# Patient Record
Sex: Female | Born: 1945 | Race: Black or African American | Hispanic: No | Smoking: Current every day smoker
Health system: Southern US, Community
[De-identification: ages and names within clinical notes are randomized; demographics above are authoritative.]

## PROBLEM LIST (undated history)

## (undated) DIAGNOSIS — Z8489 Family history of other specified conditions: Secondary | ICD-10-CM

## (undated) DIAGNOSIS — L97519 Non-pressure chronic ulcer of other part of right foot with unspecified severity: Secondary | ICD-10-CM

## (undated) DIAGNOSIS — L97329 Non-pressure chronic ulcer of left ankle with unspecified severity: Secondary | ICD-10-CM

## (undated) DIAGNOSIS — T4145XA Adverse effect of unspecified anesthetic, initial encounter: Secondary | ICD-10-CM

## (undated) DIAGNOSIS — M1990 Unspecified osteoarthritis, unspecified site: Secondary | ICD-10-CM

## (undated) DIAGNOSIS — I739 Peripheral vascular disease, unspecified: Secondary | ICD-10-CM

## (undated) DIAGNOSIS — T8859XA Other complications of anesthesia, initial encounter: Secondary | ICD-10-CM

## (undated) DIAGNOSIS — I10 Essential (primary) hypertension: Secondary | ICD-10-CM

## (undated) DIAGNOSIS — J449 Chronic obstructive pulmonary disease, unspecified: Secondary | ICD-10-CM

## (undated) DIAGNOSIS — Z972 Presence of dental prosthetic device (complete) (partial): Secondary | ICD-10-CM

## (undated) DIAGNOSIS — R011 Cardiac murmur, unspecified: Secondary | ICD-10-CM

## (undated) DIAGNOSIS — L409 Psoriasis, unspecified: Secondary | ICD-10-CM

## (undated) DIAGNOSIS — K219 Gastro-esophageal reflux disease without esophagitis: Secondary | ICD-10-CM

## (undated) DIAGNOSIS — K5900 Constipation, unspecified: Secondary | ICD-10-CM

## (undated) HISTORY — PX: DILATION AND CURETTAGE OF UTERUS: SHX78

## (undated) HISTORY — DX: Gastro-esophageal reflux disease without esophagitis: K21.9

## (undated) HISTORY — PX: CARDIOVASCULAR STRESS TEST: SHX262

## (undated) HISTORY — PX: ENDOVASCULAR STENT INSERTION: SHX5161

## (undated) HISTORY — PX: TUBAL LIGATION: SHX77

---

## 2005-04-02 ENCOUNTER — Emergency Department: Payer: Self-pay | Admitting: Unknown Physician Specialty

## 2005-04-02 ENCOUNTER — Other Ambulatory Visit: Payer: Self-pay

## 2005-07-06 ENCOUNTER — Other Ambulatory Visit: Payer: Self-pay

## 2005-07-06 ENCOUNTER — Inpatient Hospital Stay: Payer: Self-pay | Admitting: Internal Medicine

## 2007-03-06 ENCOUNTER — Ambulatory Visit: Payer: Self-pay

## 2009-07-27 ENCOUNTER — Ambulatory Visit: Payer: Self-pay

## 2011-01-02 ENCOUNTER — Emergency Department: Payer: Self-pay | Admitting: *Deleted

## 2011-01-06 ENCOUNTER — Ambulatory Visit: Payer: Self-pay | Admitting: Family Medicine

## 2013-05-29 HISTORY — PX: CATARACT EXTRACTION W/ INTRAOCULAR LENS  IMPLANT, BILATERAL: SHX1307

## 2013-06-03 ENCOUNTER — Ambulatory Visit: Payer: Self-pay | Admitting: Family Medicine

## 2013-06-10 ENCOUNTER — Ambulatory Visit: Payer: Self-pay | Admitting: Family Medicine

## 2013-10-01 ENCOUNTER — Ambulatory Visit: Payer: Self-pay | Admitting: Ophthalmology

## 2013-10-17 ENCOUNTER — Ambulatory Visit: Payer: Self-pay | Admitting: Family Medicine

## 2013-10-29 ENCOUNTER — Ambulatory Visit: Payer: Self-pay | Admitting: Ophthalmology

## 2013-12-03 ENCOUNTER — Ambulatory Visit (INDEPENDENT_AMBULATORY_CARE_PROVIDER_SITE_OTHER): Payer: Commercial Managed Care - HMO | Admitting: Podiatry

## 2013-12-03 ENCOUNTER — Encounter: Payer: Self-pay | Admitting: Podiatry

## 2013-12-03 VITALS — BP 139/70 | HR 85 | Resp 16 | Ht 64.0 in | Wt 116.0 lb

## 2013-12-03 DIAGNOSIS — M79609 Pain in unspecified limb: Secondary | ICD-10-CM

## 2013-12-03 DIAGNOSIS — L97322 Non-pressure chronic ulcer of left ankle with fat layer exposed: Secondary | ICD-10-CM

## 2013-12-03 DIAGNOSIS — B353 Tinea pedis: Secondary | ICD-10-CM

## 2013-12-03 DIAGNOSIS — M79676 Pain in unspecified toe(s): Secondary | ICD-10-CM

## 2013-12-03 DIAGNOSIS — L97309 Non-pressure chronic ulcer of unspecified ankle with unspecified severity: Secondary | ICD-10-CM

## 2013-12-03 DIAGNOSIS — B351 Tinea unguium: Secondary | ICD-10-CM

## 2013-12-03 MED ORDER — ECONAZOLE NITRATE 1 % EX CREA: TOPICAL_CREAM | Freq: Two times a day (BID) | CUTANEOUS | Status: DC

## 2013-12-03 NOTE — Progress Notes (Signed)
   Subjective:    Patient ID: Tracey Rogers, female    DOB: Jan 22, 1946, 68 y.o.   MRN: [id number]  HPI Comments: SHE NEEDS HER TOENAILS TRIMMED. SHE WENT TO WOUND CARE YESTERDAY AND THEY TOLD HER SHE NEEDED THEM CUT. THEY ARE STARTING TO RUB.     Review of Systems     Objective:   Physical Exam: I have reviewed her past medical history medications allergies surgeries social history. Pulses are palpable bilateral. Neurologic sensorium is intact per since once the monofilament. Deep tendon reflexes are intact bilateral muscle strength is 5 over 5 dorsiflexors plantar flexors inverters everters all his musculature is intact. Orthopedic evaluation demonstrates all joints distal to the ankle a full range of motion without crepitus she has HAV deformity hammertoe deformities noted bilateral. Cutaneous evaluation demonstrates a ulcerative lesion the posterior aspect of the left leg which the wound care center is taking care of. She also has developed a superficial ulceration to the medial aspect distal second toe left foot. No bone is visible and is very superficial without signs of infection. Nails are thick yellow dystrophic onychomycotic and painful palpation.        Assessment & Plan:  Assessment: Ulceration posterior aspect left foot. Ulceration second digit left foot. Pain in limb secondary to onychomycosis 1 through 5 bilateral.  Plan: Debridement of nails 1 through 5 bilateral. Debrided the ulcer to the second toe left today applied dry sterile compressive dressing with instructions of care. I will followup with her in 3 months wound care center well evaluate all ulcerations.

## 2013-12-24 ENCOUNTER — Ambulatory Visit: Payer: Self-pay | Admitting: Vascular Surgery

## 2013-12-24 LAB — BASIC METABOLIC PANEL
Anion Gap: 12 (ref 7–16)
BUN: 5 mg/dL — AB (ref 7–18)
CALCIUM: 9 mg/dL (ref 8.5–10.1)
Chloride: 92 mmol/L — ABNORMAL LOW (ref 98–107)
Co2: 24 mmol/L (ref 21–32)
Creatinine: 0.53 mg/dL — ABNORMAL LOW (ref 0.60–1.30)
EGFR (African American): >60
EGFR (Non-African Amer.): >60
Glucose: 86 mg/dL (ref 65–99)
OSMOLALITY: 254 (ref 275–301)
POTASSIUM: 3.5 mmol/L (ref 3.5–5.1)
SODIUM: 128 mmol/L — AB (ref 136–145)

## 2014-01-06 ENCOUNTER — Other Ambulatory Visit: Payer: Self-pay

## 2014-01-06 ENCOUNTER — Telehealth: Payer: Self-pay | Admitting: *Deleted

## 2014-01-06 LAB — HEMOGLOBIN A1C: Hemoglobin A1C: 5.3 % (ref 4.2–6.3)

## 2014-01-06 NOTE — Telephone Encounter (Signed)
Can he call her in something else for the bottom of her foot?  The cream he ordered isn't helping.  You can reach me at this number.

## 2014-01-06 NOTE — Telephone Encounter (Signed)
You could call her in naftin cream 1% apply twice daily.  60 gram tube.  3 refills.

## 2014-01-07 ENCOUNTER — Telehealth: Payer: Self-pay | Admitting: *Deleted

## 2014-01-07 MED ORDER — NAFTIFINE HCL 2 % EX CREA: 1.0000 "application " | TOPICAL_CREAM | CUTANEOUS | Status: DC

## 2014-01-07 NOTE — Telephone Encounter (Signed)
Pt needing new cream for her foot , ok per dr Milinda Pointer to call in naftin cream

## 2014-01-09 ENCOUNTER — Encounter: Payer: Self-pay | Admitting: Surgery

## 2014-01-11 LAB — WOUND CULTURE

## 2014-01-27 ENCOUNTER — Encounter: Payer: Self-pay | Admitting: Surgery

## 2014-02-03 ENCOUNTER — Encounter: Payer: Self-pay | Admitting: General Surgery

## 2014-02-06 ENCOUNTER — Ambulatory Visit: Payer: Self-pay | Admitting: Vascular Surgery

## 2014-02-06 LAB — BASIC METABOLIC PANEL
Anion Gap: 9 (ref 7–16)
BUN: 6 mg/dL — AB (ref 7–18)
CALCIUM: 8.8 mg/dL (ref 8.5–10.1)
CHLORIDE: 98 mmol/L (ref 98–107)
Co2: 22 mmol/L (ref 21–32)
Creatinine: 0.79 mg/dL (ref 0.60–1.30)
EGFR (Non-African Amer.): >60
Glucose: 80 mg/dL (ref 65–99)
Osmolality: 256 (ref 275–301)
POTASSIUM: 4.2 mmol/L (ref 3.5–5.1)
Sodium: 129 mmol/L — ABNORMAL LOW (ref 136–145)

## 2014-02-10 DIAGNOSIS — L97929 Non-pressure chronic ulcer of unspecified part of left lower leg with unspecified severity: Secondary | ICD-10-CM | POA: Insufficient documentation

## 2014-02-10 DIAGNOSIS — L97909 Non-pressure chronic ulcer of unspecified part of unspecified lower leg with unspecified severity: Secondary | ICD-10-CM | POA: Insufficient documentation

## 2014-02-16 DIAGNOSIS — I771 Stricture of artery: Secondary | ICD-10-CM | POA: Insufficient documentation

## 2014-02-16 DIAGNOSIS — Z720 Tobacco use: Secondary | ICD-10-CM | POA: Insufficient documentation

## 2014-02-16 DIAGNOSIS — L97309 Non-pressure chronic ulcer of unspecified ankle with unspecified severity: Secondary | ICD-10-CM | POA: Insufficient documentation

## 2014-02-16 DIAGNOSIS — I7025 Atherosclerosis of native arteries of other extremities with ulceration: Secondary | ICD-10-CM | POA: Insufficient documentation

## 2014-02-16 DIAGNOSIS — G458 Other transient cerebral ischemic attacks and related syndromes: Secondary | ICD-10-CM | POA: Insufficient documentation

## 2014-02-16 DIAGNOSIS — I739 Peripheral vascular disease, unspecified: Secondary | ICD-10-CM | POA: Insufficient documentation

## 2014-02-16 DIAGNOSIS — I10 Essential (primary) hypertension: Secondary | ICD-10-CM | POA: Insufficient documentation

## 2014-02-16 DIAGNOSIS — L98499 Non-pressure chronic ulcer of skin of other sites with unspecified severity: Secondary | ICD-10-CM | POA: Insufficient documentation

## 2014-02-16 DIAGNOSIS — J449 Chronic obstructive pulmonary disease, unspecified: Secondary | ICD-10-CM | POA: Insufficient documentation

## 2014-02-16 DIAGNOSIS — Z87891 Personal history of nicotine dependence: Secondary | ICD-10-CM | POA: Insufficient documentation

## 2014-03-03 HISTORY — PX: TRANSTHORACIC ECHOCARDIOGRAM: SHX275

## 2014-03-09 ENCOUNTER — Ambulatory Visit (INDEPENDENT_AMBULATORY_CARE_PROVIDER_SITE_OTHER): Payer: Commercial Managed Care - HMO | Admitting: Podiatry

## 2014-03-09 DIAGNOSIS — M79676 Pain in unspecified toe(s): Secondary | ICD-10-CM

## 2014-03-09 DIAGNOSIS — B351 Tinea unguium: Secondary | ICD-10-CM

## 2014-03-09 NOTE — Progress Notes (Signed)
Presents today chief complaint of painful elongated toenails.  Objective: Pulses are palpable bilateral nails are thick, yellow dystrophic onychomycosis and painful palpation.   Assessment: Onychomycosis with pain in limb.  Plan: Treatment of nails in thickness and length as covered service secondary to pain.  

## 2014-03-18 ENCOUNTER — Ambulatory Visit: Payer: Self-pay | Admitting: Vascular Surgery

## 2014-03-18 LAB — URINALYSIS, COMPLETE
BLOOD: NEGATIVE
Bacteria: NONE SEEN
Bilirubin,UR: NEGATIVE
Glucose,UR: NEGATIVE mg/dL (ref 0–75)
Ketone: NEGATIVE
Nitrite: NEGATIVE
PROTEIN: NEGATIVE
Ph: 6 (ref 4.5–8.0)
RBC, UR: NONE SEEN /HPF (ref 0–5)
Specific Gravity: 1.008 (ref 1.003–1.030)
Squamous Epithelial: <1
WBC UR: 1 /HPF (ref 0–5)

## 2014-03-18 LAB — CBC
HCT: 34.3 % — ABNORMAL LOW (ref 35.0–47.0)
HGB: 10.7 g/dL — ABNORMAL LOW (ref 12.0–16.0)
MCH: 29.8 pg (ref 26.0–34.0)
MCHC: 31.3 g/dL — ABNORMAL LOW (ref 32.0–36.0)
MCV: 95 fL (ref 80–100)
PLATELETS: 342 10*3/uL (ref 150–440)
RBC: 3.6 10*6/uL — AB (ref 3.80–5.20)
RDW: 14.8 % — AB (ref 11.5–14.5)
WBC: 5 10*3/uL (ref 3.6–11.0)

## 2014-03-18 LAB — MRSA PCR SCREENING

## 2014-03-18 LAB — BASIC METABOLIC PANEL
Anion Gap: 8 (ref 7–16)
BUN: 9 mg/dL (ref 7–18)
CALCIUM: 8.8 mg/dL (ref 8.5–10.1)
CREATININE: 0.66 mg/dL (ref 0.60–1.30)
Chloride: 99 mmol/L (ref 98–107)
Co2: 25 mmol/L (ref 21–32)
GLUCOSE: 75 mg/dL (ref 65–99)
OSMOLALITY: 262 (ref 275–301)
Potassium: 4.6 mmol/L (ref 3.5–5.1)
SODIUM: 132 mmol/L — AB (ref 136–145)

## 2014-03-18 LAB — PROTIME-INR
INR: 0.9
Prothrombin Time: 12.5 secs (ref 11.5–14.7)

## 2014-03-18 LAB — APTT: Activated PTT: 32.8 secs (ref 23.6–35.9)

## 2014-03-27 ENCOUNTER — Inpatient Hospital Stay: Payer: Self-pay | Admitting: Vascular Surgery

## 2014-03-28 LAB — BASIC METABOLIC PANEL
ANION GAP: 8 (ref 7–16)
BUN: 5 mg/dL — AB (ref 7–18)
CALCIUM: 8 mg/dL — AB (ref 8.5–10.1)
CHLORIDE: 103 mmol/L (ref 98–107)
CREATININE: 0.56 mg/dL — AB (ref 0.60–1.30)
Co2: 25 mmol/L (ref 21–32)
Glucose: 94 mg/dL (ref 65–99)
Osmolality: 269 (ref 275–301)
POTASSIUM: 3.7 mmol/L (ref 3.5–5.1)
Sodium: 136 mmol/L (ref 136–145)

## 2014-03-28 LAB — CBC WITH DIFFERENTIAL/PLATELET
Basophil #: 0.1 10*3/uL (ref 0.0–0.1)
Basophil %: 1.1 %
EOS ABS: 0.2 10*3/uL (ref 0.0–0.7)
EOS PCT: 3.4 %
HCT: 29.2 % — AB (ref 35.0–47.0)
HGB: 9.6 g/dL — ABNORMAL LOW (ref 12.0–16.0)
LYMPHS PCT: 13.4 %
Lymphocyte #: 0.8 10*3/uL — ABNORMAL LOW (ref 1.0–3.6)
MCH: 30.8 pg (ref 26.0–34.0)
MCHC: 32.8 g/dL (ref 32.0–36.0)
MCV: 94 fL (ref 80–100)
MONO ABS: 0.7 x10 3/mm (ref 0.2–0.9)
MONOS PCT: 11.7 %
NEUTROS ABS: 4.3 10*3/uL (ref 1.4–6.5)
Neutrophil %: 70.4 %
Platelet: 222 10*3/uL (ref 150–440)
RBC: 3.1 10*6/uL — AB (ref 3.80–5.20)
RDW: 14.4 % (ref 11.5–14.5)
WBC: 6.1 10*3/uL (ref 3.6–11.0)

## 2014-04-08 ENCOUNTER — Encounter (HOSPITAL_BASED_OUTPATIENT_CLINIC_OR_DEPARTMENT_OTHER): Payer: Self-pay | Admitting: *Deleted

## 2014-04-09 ENCOUNTER — Encounter (HOSPITAL_BASED_OUTPATIENT_CLINIC_OR_DEPARTMENT_OTHER): Payer: Self-pay | Admitting: *Deleted

## 2014-04-09 NOTE — Progress Notes (Signed)
NPO AFTER MN WITH EXCEPTION CLEAR LIQUIDS UNTIL 0700 (NO CREAM/ MILK PRODUCTS).  ARRIVE AT 1130. NEEDS ISTAT . CURRENT EKG , STRESS TEST AND LOV NOTE TO BE FAXED FROM DR PARACHOAS.  MAY TAKE OXYCODONE IF NEEDED AM DOS W/ SIPS OF WATER.

## 2014-04-10 ENCOUNTER — Encounter (HOSPITAL_BASED_OUTPATIENT_CLINIC_OR_DEPARTMENT_OTHER): Payer: Self-pay | Admitting: *Deleted

## 2014-04-10 ENCOUNTER — Other Ambulatory Visit (HOSPITAL_COMMUNITY): Payer: Self-pay | Admitting: Plastic Surgery

## 2014-04-10 ENCOUNTER — Other Ambulatory Visit: Payer: Self-pay | Admitting: Plastic Surgery

## 2014-04-10 ENCOUNTER — Ambulatory Visit (HOSPITAL_COMMUNITY)
Admission: RE | Admit: 2014-04-10 | Discharge: 2014-04-10 | Disposition: A | Discharge status: Home / Self Care | Payer: Medicare HMO | Source: Ambulatory Visit | Attending: Plastic Surgery | Admitting: Plastic Surgery

## 2014-04-10 DIAGNOSIS — M85871 Other specified disorders of bone density and structure, right ankle and foot: Secondary | ICD-10-CM | POA: Diagnosis not present

## 2014-04-10 DIAGNOSIS — B999 Unspecified infectious disease: Secondary | ICD-10-CM

## 2014-04-10 DIAGNOSIS — M2011 Hallux valgus (acquired), right foot: Secondary | ICD-10-CM | POA: Insufficient documentation

## 2014-04-10 DIAGNOSIS — S91301A Unspecified open wound, right foot, initial encounter: Secondary | ICD-10-CM | POA: Diagnosis present

## 2014-04-10 DIAGNOSIS — L97509 Non-pressure chronic ulcer of other part of unspecified foot with unspecified severity: Secondary | ICD-10-CM | POA: Insufficient documentation

## 2014-04-10 DIAGNOSIS — M7989 Other specified soft tissue disorders: Secondary | ICD-10-CM | POA: Diagnosis not present

## 2014-04-10 DIAGNOSIS — L97913 Non-pressure chronic ulcer of unspecified part of right lower leg with necrosis of muscle: Secondary | ICD-10-CM

## 2014-04-10 DIAGNOSIS — E119 Type 2 diabetes mellitus without complications: Secondary | ICD-10-CM | POA: Insufficient documentation

## 2014-04-10 DIAGNOSIS — L97519 Non-pressure chronic ulcer of other part of right foot with unspecified severity: Secondary | ICD-10-CM

## 2014-04-10 NOTE — H&P (Signed)
Tracey Rogers is an 68 y.o. female.   Chief Complaint: bilateral lower extremity ulcers HPI: The patient is a 68 yrs old wf here for evaluation of her left leg ulcer. She hit her leg 4 months ago and developed a wound on the posterior left ankle. She has been seen in the wound care center and been treated with local dressing changes. The achilles tendon is exposed and masserated. The wound is 7 x 3 cm in length. The ankle has little to no movement in plantar or dorsiflexion. The pulses are weak but present. She quit smoking 2 months ago. She recently underwent left leg stent placement by a vascular surgeon, Dr. Delana Meyer, in [place] [phone]). The area seems to be getting worse and larger in size. She has started drinking Ensure. She now has a wound on the medial aspect of the right great toe.  Past Medical History  Diagnosis Date  . Hypertension   . Ulcer of left ankle   . Peripheral vascular disease   . Constipation   . Arthritis   . Psoriasis   . Skin ulcer of right great toe   . Wears dentures     Past Surgical History  Procedure Laterality Date  . Cataract extraction w/ intraocular lens  implant, bilateral  2015  . Endovascular stent insertion  sept  &  oct  2015    LEFT LEG STENTING    No family history on file. Social History:  reports that she quit smoking about 3 months ago. Her smoking use included Cigarettes. She has a 21 pack-year smoking history. She has never used smokeless tobacco. She reports that she does not drink alcohol or use illicit drugs.  Allergies:  Allergies  Allergen Reactions  . Tetracyclines & Related Rash     (Not in a hospital admission)  No results found for this or any previous visit (from the past 48 hour(s)). No results found.  Review of Systems  Constitutional: Negative.   HENT: Negative.   Eyes: Negative.   Respiratory: Negative.   Cardiovascular: Negative.   Gastrointestinal: Negative.   Genitourinary: Negative.     Musculoskeletal: Positive for joint pain.  Skin: Negative.   Psychiatric/Behavioral: Negative.     There were no vitals taken for this visit. Physical Exam  Constitutional: She is oriented to person, place, and time. She appears well-developed.  HENT:  Head: Normocephalic and atraumatic.  Eyes: Conjunctivae and EOM are normal. Pupils are equal, round, and reactive to light.  Cardiovascular: Normal rate.   Respiratory: Effort normal.  Musculoskeletal:       Legs: Neurological: She is alert and oriented to person, place, and time.  Psychiatric: She has a normal mood and affect. Her behavior is normal. Judgment and thought content normal.     Assessment/Plan Plan for irrigation and debridement of bilateral lower extremity wounds. Will also get an xray of the right foot. The risks that can be encountered with and after excision of a wound were discussed and include the following but not limited to these: bleeding, infection, delayed healing, anesthesia risks, skin sensation changes, injury to structures including nerves, blood vessels, and muscles which may be temporary or permanent, allergies to tape, suture materials and glues, blood products, topical preparations or injected agents, skin contour irregularities, skin discoloration and swelling, deep vein thrombosis, cardiac and pulmonary complications, pain, which may persist, persistent pain, recurrence of the lesion, poor healing of the incision, possible need for revisional surgery or staged procedures.  SANGER,CLAIRE 04/10/2014,  12:48 PM

## 2014-04-12 NOTE — Anesthesia Preprocedure Evaluation (Addendum)
Anesthesia Evaluation  Patient identified by MRN, date of birth, ID band Patient awake    Reviewed: Allergy & Precautions, H&P , NPO status , Patient's Chart, lab work & pertinent test results  History of Anesthesia Complications Negative for: history of anesthetic complications  Airway Mallampati: II  TM Distance: >3 FB Neck ROM: Full    Dental no notable dental hx. (+) Edentulous Upper, Edentulous Lower   Pulmonary former smoker,  breath sounds clear to auscultation  Pulmonary exam normal       Cardiovascular hypertension, Pt. on medications + Peripheral Vascular Disease Rhythm:Regular Rate:Normal  Seen in October 2015 by cardiology and cleared for surgery, normal ECHO and negative stress testing at that time    Neuro/Psych negative neurological ROS  negative psych ROS   GI/Hepatic negative GI ROS, Neg liver ROS,   Endo/Other  negative endocrine ROS  Renal/GU negative Renal ROS  negative genitourinary   Musculoskeletal  (+) Arthritis -, Osteoarthritis,    Abdominal   Peds negative pediatric ROS (+)  Hematology negative hematology ROS (+)   Anesthesia Other Findings   Reproductive/Obstetrics negative OB ROS                            Anesthesia Physical Anesthesia Plan  ASA: III  Anesthesia Plan: General   Post-op Pain Management:    Induction: Intravenous  Airway Management Planned: LMA  Additional Equipment:   Intra-op Plan:   Post-operative Plan: Extubation in OR  Informed Consent: I have reviewed the patients History and Physical, chart, labs and discussed the procedure including the risks, benefits and alternatives for the proposed anesthesia with the patient or authorized representative who has indicated his/her understanding and acceptance.   Dental advisory given  Plan Discussed with: CRNA  Anesthesia Plan Comments:        Anesthesia Quick Evaluation

## 2014-04-13 ENCOUNTER — Encounter (HOSPITAL_BASED_OUTPATIENT_CLINIC_OR_DEPARTMENT_OTHER)
Admission: RE | Disposition: A | Discharge status: Home / Self Care | Payer: Self-pay | Source: Ambulatory Visit | Attending: Plastic Surgery

## 2014-04-13 ENCOUNTER — Ambulatory Visit (HOSPITAL_BASED_OUTPATIENT_CLINIC_OR_DEPARTMENT_OTHER)
Admission: RE | Admit: 2014-04-13 | Discharge: 2014-04-13 | Disposition: A | Discharge status: Home / Self Care | Payer: Commercial Managed Care - HMO | Source: Ambulatory Visit | Attending: Plastic Surgery | Admitting: Plastic Surgery

## 2014-04-13 ENCOUNTER — Ambulatory Visit (HOSPITAL_BASED_OUTPATIENT_CLINIC_OR_DEPARTMENT_OTHER): Payer: Commercial Managed Care - HMO | Admitting: Anesthesiology

## 2014-04-13 ENCOUNTER — Encounter (HOSPITAL_BASED_OUTPATIENT_CLINIC_OR_DEPARTMENT_OTHER): Payer: Self-pay | Admitting: Plastic Surgery

## 2014-04-13 DIAGNOSIS — L409 Psoriasis, unspecified: Secondary | ICD-10-CM | POA: Insufficient documentation

## 2014-04-13 DIAGNOSIS — L97511 Non-pressure chronic ulcer of other part of right foot limited to breakdown of skin: Secondary | ICD-10-CM | POA: Insufficient documentation

## 2014-04-13 DIAGNOSIS — Z7982 Long term (current) use of aspirin: Secondary | ICD-10-CM | POA: Diagnosis not present

## 2014-04-13 DIAGNOSIS — L97519 Non-pressure chronic ulcer of other part of right foot with unspecified severity: Secondary | ICD-10-CM

## 2014-04-13 DIAGNOSIS — Z87891 Personal history of nicotine dependence: Secondary | ICD-10-CM | POA: Diagnosis not present

## 2014-04-13 DIAGNOSIS — M1990 Unspecified osteoarthritis, unspecified site: Secondary | ICD-10-CM | POA: Insufficient documentation

## 2014-04-13 DIAGNOSIS — L97322 Non-pressure chronic ulcer of left ankle with fat layer exposed: Secondary | ICD-10-CM | POA: Diagnosis present

## 2014-04-13 DIAGNOSIS — I739 Peripheral vascular disease, unspecified: Secondary | ICD-10-CM | POA: Insufficient documentation

## 2014-04-13 DIAGNOSIS — I10 Essential (primary) hypertension: Secondary | ICD-10-CM | POA: Diagnosis not present

## 2014-04-13 DIAGNOSIS — L97913 Non-pressure chronic ulcer of unspecified part of right lower leg with necrosis of muscle: Secondary | ICD-10-CM

## 2014-04-13 DIAGNOSIS — Z881 Allergy status to other antibiotic agents status: Secondary | ICD-10-CM | POA: Insufficient documentation

## 2014-04-13 HISTORY — DX: Psoriasis, unspecified: L40.9

## 2014-04-13 HISTORY — DX: Non-pressure chronic ulcer of left ankle with unspecified severity: L97.329

## 2014-04-13 HISTORY — PX: INCISION AND DRAINAGE OF WOUND: SHX1803

## 2014-04-13 HISTORY — DX: Non-pressure chronic ulcer of other part of right foot with unspecified severity: L97.519

## 2014-04-13 HISTORY — DX: Constipation, unspecified: K59.00

## 2014-04-13 HISTORY — DX: Peripheral vascular disease, unspecified: I73.9

## 2014-04-13 HISTORY — DX: Presence of dental prosthetic device (complete) (partial): Z97.2

## 2014-04-13 HISTORY — DX: Essential (primary) hypertension: I10

## 2014-04-13 HISTORY — PX: APPLICATION OF A-CELL OF EXTREMITY: SHX6303

## 2014-04-13 HISTORY — DX: Unspecified osteoarthritis, unspecified site: M19.90

## 2014-04-13 LAB — POCT I-STAT 4, (NA,K, GLUC, HGB,HCT)
Glucose, Bld: 98 mg/dL (ref 70–99)
HCT: 36 % (ref 36.0–46.0)
HEMOGLOBIN: 12.2 g/dL (ref 12.0–15.0)
Potassium: 4.4 mEq/L (ref 3.7–5.3)
Sodium: 134 mEq/L — ABNORMAL LOW (ref 137–147)

## 2014-04-13 SURGERY — IRRIGATION AND DEBRIDEMENT WOUND
Anesthesia: General | Site: Foot | Laterality: Left

## 2014-04-13 MED ORDER — MIDAZOLAM HCL 2 MG/2ML IJ SOLN
INTRAMUSCULAR | Status: AC
  Filled 2014-04-13: qty 2

## 2014-04-13 MED ORDER — DEXAMETHASONE SODIUM PHOSPHATE 4 MG/ML IJ SOLN
INTRAMUSCULAR | Status: DC | PRN
  Administered 2014-04-13: 4 mg via INTRAVENOUS

## 2014-04-13 MED ORDER — FENTANYL CITRATE 0.05 MG/ML IJ SOLN
INTRAMUSCULAR | Status: DC | PRN
  Administered 2014-04-13 (×2): 25 ug via INTRAVENOUS
  Administered 2014-04-13: 50 ug via INTRAVENOUS
  Administered 2014-04-13 (×4): 25 ug via INTRAVENOUS

## 2014-04-13 MED ORDER — FENTANYL CITRATE 0.05 MG/ML IJ SOLN
25.0000 ug | INTRAMUSCULAR | Status: DC | PRN
  Filled 2014-04-13: qty 1

## 2014-04-13 MED ORDER — HYDROCODONE-ACETAMINOPHEN 5-325 MG PO TABS: 1.0000 | ORAL_TABLET | Freq: Four times a day (QID) | ORAL | Status: DC | PRN

## 2014-04-13 MED ORDER — SODIUM CHLORIDE 0.9 % IR SOLN
Status: DC | PRN
  Administered 2014-04-13: 14:00:00

## 2014-04-13 MED ORDER — LIDOCAINE HCL (CARDIAC) 20 MG/ML IV SOLN
INTRAVENOUS | Status: DC | PRN
  Administered 2014-04-13: 50 mg via INTRAVENOUS

## 2014-04-13 MED ORDER — OXYCODONE HCL 5 MG PO TABS
5.0000 mg | ORAL_TABLET | ORAL | Status: DC | PRN
  Administered 2014-04-13: 5 mg via ORAL
  Filled 2014-04-13: qty 1

## 2014-04-13 MED ORDER — LACTATED RINGERS IV SOLN
INTRAVENOUS | Status: DC
  Administered 2014-04-13: 13:00:00 via INTRAVENOUS
  Filled 2014-04-13: qty 1000

## 2014-04-13 MED ORDER — PROPOFOL 10 MG/ML IV BOLUS
INTRAVENOUS | Status: DC | PRN
  Administered 2014-04-13: 30 mg via INTRAVENOUS

## 2014-04-13 MED ORDER — CEFAZOLIN SODIUM-DEXTROSE 2-3 GM-% IV SOLR
2.0000 g | INTRAVENOUS | Status: AC
  Administered 2014-04-13: 2 g via INTRAVENOUS
  Filled 2014-04-13: qty 50

## 2014-04-13 MED ORDER — ACETAMINOPHEN 10 MG/ML IV SOLN
INTRAVENOUS | Status: DC | PRN
  Administered 2014-04-13: 1000 mg via INTRAVENOUS

## 2014-04-13 MED ORDER — OXYCODONE HCL 5 MG PO TABS: 5.0000 mg | ORAL_TABLET | ORAL | Status: DC | PRN

## 2014-04-13 MED ORDER — OXYCODONE HCL 5 MG PO TABS
ORAL_TABLET | ORAL | Status: AC
  Filled 2014-04-13: qty 1

## 2014-04-13 MED ORDER — ONDANSETRON HCL 4 MG/2ML IJ SOLN
INTRAMUSCULAR | Status: DC | PRN
  Administered 2014-04-13: 4 mg via INTRAVENOUS

## 2014-04-13 MED ORDER — ONDANSETRON HCL 4 MG/2ML IJ SOLN
4.0000 mg | Freq: Once | INTRAMUSCULAR | Status: DC | PRN
  Filled 2014-04-13: qty 2

## 2014-04-13 MED ORDER — LIDOCAINE-EPINEPHRINE (PF) 1 %-1:200000 IJ SOLN
INTRAMUSCULAR | Status: DC | PRN
  Administered 2014-04-13: 2 mL

## 2014-04-13 MED ORDER — FENTANYL CITRATE 0.05 MG/ML IJ SOLN
INTRAMUSCULAR | Status: AC
  Filled 2014-04-13: qty 4

## 2014-04-13 MED ORDER — CEFAZOLIN SODIUM-DEXTROSE 2-3 GM-% IV SOLR
INTRAVENOUS | Status: AC
  Filled 2014-04-13: qty 50

## 2014-04-13 SURGICAL SUPPLY — 97 items
BAG DECANTER FOR FLEXI CONT (MISCELLANEOUS) IMPLANT
BANDAGE ELASTIC 3 VELCRO ST LF (GAUZE/BANDAGES/DRESSINGS) IMPLANT
BANDAGE ELASTIC 4 VELCRO ST LF (GAUZE/BANDAGES/DRESSINGS) ×6 IMPLANT
BANDAGE ELASTIC 6 VELCRO ST LF (GAUZE/BANDAGES/DRESSINGS) IMPLANT
BENZOIN TINCTURE PRP APPL 2/3 (GAUZE/BANDAGES/DRESSINGS) IMPLANT
BLADE MINI RND TIP GREEN BEAV (BLADE) IMPLANT
BLADE SURG 10 STRL SS (BLADE) ×6 IMPLANT
BLADE SURG 15 STRL LF DISP TIS (BLADE) ×2 IMPLANT
BLADE SURG 15 STRL SS (BLADE) ×1
BNDG COHESIVE 1X5 TAN STRL LF (GAUZE/BANDAGES/DRESSINGS) IMPLANT
BNDG COHESIVE 4X5 TAN NS LF (GAUZE/BANDAGES/DRESSINGS) IMPLANT
BNDG ESMARK 4X9 LF (GAUZE/BANDAGES/DRESSINGS) IMPLANT
BNDG GAUZE ELAST 4 BULKY (GAUZE/BANDAGES/DRESSINGS) ×6 IMPLANT
CANISTER OMNI JUG 16 LITER (MISCELLANEOUS) IMPLANT
CANISTER SUCT LVC 12 LTR MEDI- (MISCELLANEOUS) IMPLANT
CANISTER SUCTION 1200CC (MISCELLANEOUS) IMPLANT
CANISTER SUCTION 2500CC (MISCELLANEOUS) ×3 IMPLANT
CHLORAPREP W/TINT 26ML (MISCELLANEOUS) IMPLANT
CLOTH BEACON ORANGE TIMEOUT ST (SAFETY) ×3 IMPLANT
CORDS BIPOLAR (ELECTRODE) IMPLANT
COVER MAYO STAND STRL (DRAPES) IMPLANT
COVER TABLE BACK 60X90 (DRAPES) ×3 IMPLANT
DECANTER SPIKE VIAL GLASS SM (MISCELLANEOUS) IMPLANT
DRAIN PENROSE 18X1/2 LTX STRL (DRAIN) IMPLANT
DRAPE EXTREMITY BILATERAL (DRAPE) ×6 IMPLANT
DRAPE EXTREMITY T 121X128X90 (DRAPE) IMPLANT
DRAPE EXTREMITY TIBURON (DRAPES) IMPLANT
DRAPE INCISE IOBAN 66X45 STRL (DRAPES) ×3 IMPLANT
DRAPE LG THREE QUARTER DISP (DRAPES) ×3 IMPLANT
DRAPE ORTHO SPLIT 77X108 STRL (DRAPES)
DRAPE PED LAPAROTOMY (DRAPES) IMPLANT
DRAPE SURG ORHT 6 SPLT 77X108 (DRAPES) IMPLANT
DRSG ADAPTIC 3X8 NADH LF (GAUZE/BANDAGES/DRESSINGS) ×3 IMPLANT
DRSG EMULSION OIL 3X3 NADH (GAUZE/BANDAGES/DRESSINGS) IMPLANT
ELECT NEEDLE BLADE 2-5/6 (NEEDLE) IMPLANT
ELECT NEEDLE TIP 2.8 STRL (NEEDLE) IMPLANT
ELECT REM PT RETURN 9FT ADLT (ELECTROSURGICAL) ×3
ELECTRODE REM PT RTRN 9FT ADLT (ELECTROSURGICAL) ×2 IMPLANT
GAUZE SPONGE 4X4 12PLY STRL (GAUZE/BANDAGES/DRESSINGS) ×3 IMPLANT
GAUZE XEROFORM 1X8 LF (GAUZE/BANDAGES/DRESSINGS) IMPLANT
GAUZE XEROFORM 5X9 LF (GAUZE/BANDAGES/DRESSINGS) IMPLANT
GLOVE BIO SURGEON STRL SZ 6.5 (GLOVE) ×12 IMPLANT
GLOVE BIOGEL M 6.5 STRL (GLOVE) ×9 IMPLANT
GOWN PREVENTION PLUS LG XLONG (DISPOSABLE) IMPLANT
GOWN PREVENTION PLUS XLARGE (GOWN DISPOSABLE) ×3 IMPLANT
GOWN STRL REUS W/TWL LRG LVL3 (GOWN DISPOSABLE) ×9 IMPLANT
HANDPIECE INTERPULSE COAX TIP (DISPOSABLE)
IV NS IRRIG 3000ML ARTHROMATIC (IV SOLUTION) IMPLANT
MATRIX SURGICAL PSM 7X10CM (Tissue) ×3 IMPLANT
MICROMATRIX 500MG (Tissue) ×3 IMPLANT
NEEDLE 27GAX1X1/2 (NEEDLE) IMPLANT
NEEDLE HYPO 25X1 1.5 SAFETY (NEEDLE) ×3 IMPLANT
NEEDLE HYPO 30GX1 BEV (NEEDLE) IMPLANT
NS IRRIG 1000ML POUR BTL (IV SOLUTION) ×3 IMPLANT
PACK BASIN DAY SURGERY FS (CUSTOM PROCEDURE TRAY) ×3 IMPLANT
PAD ABD 8X10 STRL (GAUZE/BANDAGES/DRESSINGS) IMPLANT
PADDING CAST ABS 3INX4YD NS (CAST SUPPLIES)
PADDING CAST ABS 4INX4YD NS (CAST SUPPLIES)
PADDING CAST ABS COTTON 3X4 (CAST SUPPLIES) IMPLANT
PADDING CAST ABS COTTON 4X4 ST (CAST SUPPLIES) IMPLANT
PENCIL BUTTON HOLSTER BLD 10FT (ELECTRODE) IMPLANT
SET HNDPC FAN SPRY TIP SCT (DISPOSABLE) IMPLANT
SLEEVE SCD COMPRESS KNEE MED (MISCELLANEOUS) IMPLANT
SOLUTION PARTIC MCRMTRX 500MG (Tissue) ×2 IMPLANT
SPLINT PLASTER CAST XFAST 3X15 (CAST SUPPLIES) IMPLANT
SPLINT PLASTER XTRA FASTSET 3X (CAST SUPPLIES)
SPONGE GAUZE 4X4 12PLY STER LF (GAUZE/BANDAGES/DRESSINGS) ×6 IMPLANT
SPONGE LAP 18X18 X RAY DECT (DISPOSABLE) ×3 IMPLANT
SPONGE LAP 4X18 X RAY DECT (DISPOSABLE) IMPLANT
STAPLER VISISTAT 35W (STAPLE) IMPLANT
STOCKINETTE 4X48 STRL (DRAPES) IMPLANT
STOCKINETTE 6  STRL (DRAPES)
STOCKINETTE 6 STRL (DRAPES) IMPLANT
STOCKINETTE IMPERVIOUS LG (DRAPES) IMPLANT
STRIP CLOSURE SKIN 1/2X4 (GAUZE/BANDAGES/DRESSINGS) IMPLANT
SUCTION FRAZIER TIP 10 FR DISP (SUCTIONS) IMPLANT
SURGILUBE 2OZ TUBE FLIPTOP (MISCELLANEOUS) ×3 IMPLANT
SUT ETHILON 3 0 PS 1 (SUTURE) IMPLANT
SUT ETHILON 4 0 P 3 18 (SUTURE) IMPLANT
SUT ETHILON 5 0 PS 2 18 (SUTURE) ×9 IMPLANT
SUT MON AB 5-0 PS2 18 (SUTURE) IMPLANT
SUT PROLENE 3 0 PS 2 (SUTURE) IMPLANT
SUT SILK 3 0 PS 1 (SUTURE) IMPLANT
SUT VIC AB 3-0 FS2 27 (SUTURE) IMPLANT
SUT VIC AB 5-0 PS2 18 (SUTURE) IMPLANT
SWAB CULTURE LIQ STUART DBL (MISCELLANEOUS) ×6 IMPLANT
SYR BULB IRRIGATION 50ML (SYRINGE) ×3 IMPLANT
SYR CONTROL 10ML LL (SYRINGE) ×3 IMPLANT
TAPE HYPAFIX 6X30 (GAUZE/BANDAGES/DRESSINGS) IMPLANT
TIP RIGID 35CM EVICEL (HEMOSTASIS) IMPLANT
TOWEL OR 17X24 6PK STRL BLUE (TOWEL DISPOSABLE) ×3 IMPLANT
TRAY DSU PREP LF (CUSTOM PROCEDURE TRAY) IMPLANT
TUBE ANAEROBIC SPECIMEN COL (MISCELLANEOUS) ×6 IMPLANT
TUBE CONNECTING 12X1/4 (SUCTIONS) ×3 IMPLANT
UNDERPAD 30X30 INCONTINENT (UNDERPADS AND DIAPERS) ×3 IMPLANT
WATER STERILE IRR 1000ML POUR (IV SOLUTION) IMPLANT
YANKAUER SUCT BULB TIP NO VENT (SUCTIONS) IMPLANT

## 2014-04-13 NOTE — Discharge Instructions (Signed)

## 2014-04-13 NOTE — Anesthesia Postprocedure Evaluation (Signed)
  Anesthesia Post-op Note  Patient: Tracey Rogers  Procedure(s) Performed: Procedure(s) (LRB): IRRIGATION AND DEBRIDEMENT OF LEFT ANKLE WOUND AND RIGHT FOOT (Bilateral) PLACEMENT OF APPLICATION OF A-CELL  (Left)  Patient Location: PACU  Anesthesia Type: General  Level of Consciousness: awake and alert   Airway and Oxygen Therapy: Patient Spontanous Breathing  Post-op Pain: mild  Post-op Assessment: Post-op Vital signs reviewed, Patient's Cardiovascular Status Stable, Respiratory Function Stable, Patent Airway and No signs of Nausea or vomiting  Last Vitals:  Filed Vitals:   04/13/14 1452  BP: 164/66  Pulse: 87  Temp: 36.4 C  Resp: 10    Post-op Vital Signs: stable   Complications: No apparent anesthesia complications

## 2014-04-13 NOTE — Interval H&P Note (Signed)
History and Physical Interval Note:  04/13/2014 7:22 AM  Leavy Cella  has presented today for surgery, with the diagnosis of ULCER LEFT ANKLE  The various methods of treatment have been discussed with the patient and family. After consideration of risks, benefits and other options for treatment, the patient has consented to  Procedure(s): IRRIGATION AND DEBRIDEMENT OF LEFT ANKLE WOUND AND RIGHT FOOT (Left) PLACEMENT OF APPLICATION OF A-CELL  (Left) as a surgical intervention .  The patient's history has been reviewed, patient examined, no change in status, stable for surgery.  I have reviewed the patient's chart and labs.  Questions were answered to the patient's satisfaction.     SANGER,Britney Captain

## 2014-04-13 NOTE — Transfer of Care (Signed)
Immediate Anesthesia Transfer of Care Note  Patient: Tracey Rogers  Procedure(s) Performed: Procedure(s): IRRIGATION AND DEBRIDEMENT OF LEFT ANKLE WOUND AND RIGHT FOOT (Bilateral) PLACEMENT OF APPLICATION OF A-CELL  (Left)  Patient Location: PACU  Anesthesia Type:General  Level of Consciousness: awake and oriented  Airway & Oxygen Therapy: Patient Spontanous Breathing and Patient connected to nasal cannula oxygen  Post-op Assessment: Report given to PACU RN  Post vital signs: Reviewed and stable  Complications: No apparent anesthesia complications

## 2014-04-13 NOTE — Anesthesia Procedure Notes (Signed)
Procedure Name: LMA Insertion Date/Time: 04/13/2014 2:09 PM Performed by: Bethena Roys T Pre-anesthesia Checklist: Patient identified, Emergency Drugs available, Suction available and Patient being monitored Patient Re-evaluated:Patient Re-evaluated prior to inductionOxygen Delivery Method: Circle System Utilized Preoxygenation: Pre-oxygenation with 100% oxygen Intubation Type: IV induction Ventilation: Mask ventilation without difficulty LMA: LMA inserted LMA Size: 4.0 Number of attempts: 1 Airway Equipment and Method: bite block Placement Confirmation: positive ETCO2 Tube secured with: Tape Dental Injury: Teeth and Oropharynx as per pre-operative assessment

## 2014-04-13 NOTE — Progress Notes (Signed)
Shawn Rayburn PA called to ask when to resume plavix, patient to restart tomorrow.

## 2014-04-13 NOTE — Op Note (Signed)
Operative Note   DATE OF OPERATION: 04/13/2014  LOCATION: [facility]  SURGICAL DIVISION: Plastic Surgery  PREOPERATIVE DIAGNOSES:  Bilateral lower extremity ulcers  POSTOPERATIVE DIAGNOSES:  same  PROCEDURE:  Preparation of ulcers for placement of Acell (500 mg and 7 x 10 cm sheet) to right great toe 2 x 2 cm and left ankle 6 x 4 cm after debridement of skin, tendon, muscle and bone  SURGEON: Theodoro Kos, DO  ASSISTANT: Shawn Rayburn, PA  ANESTHESIA:  General.   COMPLICATIONS: None.   INDICATIONS FOR PROCEDURE:  The patient, Tracey Rogers is a 68 y.o. female born on [birth year], is here for treatment of bilateral lower extremity ulcers. MRN: [id number]  CONSENT:  Informed consent was obtained directly from the patient. Risks, benefits and alternatives were fully discussed. Specific risks including but not limited to bleeding, infection, hematoma, seroma, scarring, pain, infection, contracture, asymmetry, wound healing problems, and need for further surgery were all discussed. The patient did have an ample opportunity to have questions answered to satisfaction.   DESCRIPTION OF PROCEDURE:  The patient was taken to the operating room. SCDs were placed and IV antibiotics were given. The patient's operative site was prepped and draped in a sterile fashion. A time out was performed and all information was confirmed to be correct.  General anesthesia was administered.  The #10 blade was used to debride the skin, muscle and tendon of the left leg (6 x 4 cm).  The right great toe was debrided of tendon, skin and bone with a ronguer (2 x 2 cm).  The feet were irrigated with antibiotic solution.  Hemostasis was achieved with electrocautery.  The acell powder and sheet were applied and secured with 5-0 Vicryl.  The adaptic was placed and 4 x 4 gauze with surgical lube.  A kerlex and ace wrap were placed.   The patient tolerated the procedure well.  There were no  complications. The patient was allowed to wake from anesthesia, extubated and taken to the recovery room in satisfactory condition.

## 2014-04-13 NOTE — H&P (View-Only) (Signed)
Tracey Rogers is an 68 y.o. female.   Chief Complaint: bilateral lower extremity ulcers HPI: The patient is a 68 yrs old wf here for evaluation of her left leg ulcer. She hit her leg 4 months ago and developed a wound on the posterior left ankle. She has been seen in the wound care center and been treated with local dressing changes. The achilles tendon is exposed and masserated. The wound is 7 x 3 cm in length. The ankle has little to no movement in plantar or dorsiflexion. The pulses are weak but present. She quit smoking 2 months ago. She recently underwent left leg stent placement by a vascular surgeon, Dr. Delana Meyer, in [place] [phone]). The area seems to be getting worse and larger in size. She has started drinking Ensure. She now has a wound on the medial aspect of the right great toe.  Past Medical History  Diagnosis Date  . Hypertension   . Ulcer of left ankle   . Peripheral vascular disease   . Constipation   . Arthritis   . Psoriasis   . Skin ulcer of right great toe   . Wears dentures     Past Surgical History  Procedure Laterality Date  . Cataract extraction w/ intraocular lens  implant, bilateral  2015  . Endovascular stent insertion  sept  &  oct  2015    LEFT LEG STENTING    No family history on file. Social History:  reports that she quit smoking about 3 months ago. Her smoking use included Cigarettes. She has a 21 pack-year smoking history. She has never used smokeless tobacco. She reports that she does not drink alcohol or use illicit drugs.  Allergies:  Allergies  Allergen Reactions  . Tetracyclines & Related Rash     (Not in a hospital admission)  No results found for this or any previous visit (from the past 48 hour(s)). No results found.  Review of Systems  Constitutional: Negative.   HENT: Negative.   Eyes: Negative.   Respiratory: Negative.   Cardiovascular: Negative.   Gastrointestinal: Negative.   Genitourinary: Negative.     Musculoskeletal: Positive for joint pain.  Skin: Negative.   Psychiatric/Behavioral: Negative.     There were no vitals taken for this visit. Physical Exam  Constitutional: She is oriented to person, place, and time. She appears well-developed.  HENT:  Head: Normocephalic and atraumatic.  Eyes: Conjunctivae and EOM are normal. Pupils are equal, round, and reactive to light.  Cardiovascular: Normal rate.   Respiratory: Effort normal.  Musculoskeletal:       Legs: Neurological: She is alert and oriented to person, place, and time.  Psychiatric: She has a normal mood and affect. Her behavior is normal. Judgment and thought content normal.     Assessment/Plan Plan for irrigation and debridement of bilateral lower extremity wounds. Will also get an xray of the right foot. The risks that can be encountered with and after excision of a wound were discussed and include the following but not limited to these: bleeding, infection, delayed healing, anesthesia risks, skin sensation changes, injury to structures including nerves, blood vessels, and muscles which may be temporary or permanent, allergies to tape, suture materials and glues, blood products, topical preparations or injected agents, skin contour irregularities, skin discoloration and swelling, deep vein thrombosis, cardiac and pulmonary complications, pain, which may persist, persistent pain, recurrence of the lesion, poor healing of the incision, possible need for revisional surgery or staged procedures.  SANGER,Traci Gafford 04/10/2014,  12:48 PM

## 2014-04-13 NOTE — Progress Notes (Signed)
Received phone call re: consult to arrange home health for daily dressing changes. No home health orders or face to face in chart.  Spoke with Charmaine Downs, PA-C who says patient is active with [facility].  She directed me to call [facility] and notify them of need for daily dressing changes, addition of social worker and to have [place] [facility] call office in morning for specific new orders.  [facility] [phone]) and informed her of above.  She will contact patient's home health care coordinator

## 2014-04-13 NOTE — Brief Op Note (Signed)
04/13/2014  2:44 PM  PATIENT:  Tracey Rogers  68 y.o. female  PRE-OPERATIVE DIAGNOSIS:  ULCER LEFT ANKLE  POST-OPERATIVE DIAGNOSIS:  ULCER LEFT ANKLE AND RIGHT GREAT TOE   PROCEDURE:  Procedure(s): IRRIGATION AND DEBRIDEMENT OF LEFT ANKLE WOUND AND RIGHT FOOT (Bilateral) PLACEMENT OF APPLICATION OF A-CELL  (Left)  SURGEON:  Surgeon(s) and Role:    * Ary Lavine Sanger, DO - Primary  PHYSICIAN ASSISTANT: Shawn Rayburn, PA  ASSISTANTS: none   ANESTHESIA:   general  EBL:  Total I/O In: 200 [I.V.:200] Out: -   BLOOD ADMINISTERED:none  DRAINS: none   LOCAL MEDICATIONS USED:  LIDOCAINE   SPECIMEN:  Source of Specimen:  right greast toe soft tissue and bone  DISPOSITION OF SPECIMEN:  micro  COUNTS:  YES  TOURNIQUET:  * No tourniquets in log *  DICTATION: .Dragon Dictation  PLAN OF CARE: Discharge to home after PACU  PATIENT DISPOSITION:  PACU - hemodynamically stable.   Delay start of Pharmacological VTE agent (>24hrs) due to surgical blood loss or risk of bleeding: no

## 2014-04-14 ENCOUNTER — Encounter (HOSPITAL_BASED_OUTPATIENT_CLINIC_OR_DEPARTMENT_OTHER): Payer: Self-pay | Admitting: Plastic Surgery

## 2014-04-16 LAB — CULTURE, ROUTINE-ABSCESS
Culture: NO GROWTH
Gram Stain: NONE SEEN

## 2014-04-18 LAB — ANAEROBIC CULTURE: GRAM STAIN: NONE SEEN

## 2014-05-04 ENCOUNTER — Encounter (HOSPITAL_BASED_OUTPATIENT_CLINIC_OR_DEPARTMENT_OTHER): Payer: Commercial Managed Care - HMO | Attending: Plastic Surgery

## 2014-05-04 DIAGNOSIS — I739 Peripheral vascular disease, unspecified: Secondary | ICD-10-CM | POA: Insufficient documentation

## 2014-05-04 DIAGNOSIS — I872 Venous insufficiency (chronic) (peripheral): Secondary | ICD-10-CM | POA: Insufficient documentation

## 2014-05-04 DIAGNOSIS — L97411 Non-pressure chronic ulcer of right heel and midfoot limited to breakdown of skin: Secondary | ICD-10-CM | POA: Insufficient documentation

## 2014-05-04 DIAGNOSIS — L97523 Non-pressure chronic ulcer of other part of left foot with necrosis of muscle: Secondary | ICD-10-CM | POA: Insufficient documentation

## 2014-05-04 NOTE — Progress Notes (Signed)
[facility]  NAME:  VYLA, PINT NO.:  [id number]  MEDICAL RECORD NO.:  [id number]      DATE OF BIRTH:  02/20/1946  PHYSICIAN:  Theodoro Kos, DO       VISIT DATE:  05/04/2014                                  OFFICE VISIT   The patient is a 68 year old female who is here for followup on her bilateral lower extremity chronic venous insufficiency, ulcers.  She has been putting Hydrogel on the Adaptic daily.  She underwent debridement with ACell placement 3 weeks ago.  There is no change in her medications. Review of systems is otherwise negative.  She is still taking the Bactrim.  On exam, she is alert, oriented, cooperative, not in any acute distress. She is pleasant and her daughter is with her.  Her breathing is unlabored.  Her heart rate is regular.  Her lower extremities have a bluish discoloration on the plantar aspect of the foot as well as her toes.  This is very concerning for microemboli.  The left foot ACell is intact and incorporating well.  The right has dried out and is very painful and is red.  Her cultures were not impressive and no sensitivities were done.  She had some diphtheroids.  I put a call in to Dr. Delana Meyer to talk to him about my concern about her vascularity and waiting for him to give [place] a call back.  Otherwise, she is to continue with elevation, multivitamin, vitamin C, zinc, Adaptic, and K-Y jelly daily.  We will see her back in a week.  We did discuss the possibility of amputation and I was very concerned we were heading in this direction.     Theodoro Kos, DO     CS/MEDQ  D:  05/04/2014  T:  05/04/2014  Job:  [id number]

## 2014-05-07 ENCOUNTER — Ambulatory Visit: Payer: Self-pay | Admitting: Vascular Surgery

## 2014-05-11 DIAGNOSIS — L97411 Non-pressure chronic ulcer of right heel and midfoot limited to breakdown of skin: Secondary | ICD-10-CM | POA: Diagnosis not present

## 2014-05-11 DIAGNOSIS — L97523 Non-pressure chronic ulcer of other part of left foot with necrosis of muscle: Secondary | ICD-10-CM | POA: Diagnosis not present

## 2014-05-11 DIAGNOSIS — I872 Venous insufficiency (chronic) (peripheral): Secondary | ICD-10-CM | POA: Diagnosis not present

## 2014-05-11 DIAGNOSIS — I739 Peripheral vascular disease, unspecified: Secondary | ICD-10-CM | POA: Diagnosis not present

## 2014-05-12 ENCOUNTER — Ambulatory Visit: Payer: Self-pay | Admitting: Vascular Surgery

## 2014-05-12 LAB — BASIC METABOLIC PANEL
ANION GAP: 9 (ref 7–16)
BUN: 11 mg/dL (ref 7–18)
CALCIUM: 9.4 mg/dL (ref 8.5–10.1)
CREATININE: 0.84 mg/dL (ref 0.60–1.30)
Chloride: 95 mmol/L — ABNORMAL LOW (ref 98–107)
Co2: 26 mmol/L (ref 21–32)
EGFR (African American): >60
Glucose: 103 mg/dL — ABNORMAL HIGH (ref 65–99)
Osmolality: 260 (ref 275–301)
Potassium: 4.6 mmol/L (ref 3.5–5.1)
Sodium: 130 mmol/L — ABNORMAL LOW (ref 136–145)

## 2014-05-12 NOTE — Progress Notes (Signed)
[facility]  NAME:  WAYLON, KOFFLER NO.:  [id number]  MEDICAL RECORD NO.:  [id number]      DATE OF BIRTH:  06/13/1945  PHYSICIAN:  Theodoro Kos, DO       VISIT DATE:  05/11/2014                                  OFFICE VISIT   The patient is a 68 year old female, who is here for followup on her bilateral lower extremity ulcers.  She has been using K-Y Jelly, and she is scheduled for the wound procedure tomorrow.  She has severe peripheral vascular disease.  There is no change in her medications.  On exam, she is alert, oriented, and cooperative.  She seems to understand what is happening and the severity of it is.  Her left Achilles area is granulating nicely and some debridement was done on that and that is noted in the chart.  The right foot does not look good at all.  She has got wound at the area.  She is aware that she is most likely going to need an amputation.  She is going to have the balloon procedure done tomorrow, and we will see if that helps at all.  If not, then we are looking at an amputation on the right.  Endoform was placed on the left.  She should do soaks and wet to dries on the right.  We will see her back in 2 weeks.  We also will talk about consultation for Orthopedics.     Theodoro Kos, DO     CS/MEDQ  D:  05/11/2014  T:  05/12/2014  Job:  [id number]

## 2014-05-18 ENCOUNTER — Ambulatory Visit: Payer: Self-pay | Admitting: Podiatry

## 2014-05-18 DIAGNOSIS — I739 Peripheral vascular disease, unspecified: Secondary | ICD-10-CM | POA: Diagnosis not present

## 2014-05-18 DIAGNOSIS — L97523 Non-pressure chronic ulcer of other part of left foot with necrosis of muscle: Secondary | ICD-10-CM | POA: Diagnosis not present

## 2014-05-18 DIAGNOSIS — I872 Venous insufficiency (chronic) (peripheral): Secondary | ICD-10-CM | POA: Diagnosis not present

## 2014-05-18 DIAGNOSIS — L97411 Non-pressure chronic ulcer of right heel and midfoot limited to breakdown of skin: Secondary | ICD-10-CM | POA: Diagnosis not present

## 2014-05-18 NOTE — Progress Notes (Signed)
[facility]  NAME:  Tracey Rogers, Tracey Rogers           ACCOUNT NO.:  [id number]  MEDICAL RECORD NO.:  [id number]      DATE OF BIRTH:  March 16, 1946  PHYSICIAN:  Irene Limbo, MD    VISIT DATE:  05/18/2014                                  OFFICE VISIT   CHIEF COMPLAINT:  Bilateral foot ulcerations in the setting of severe peripheral vascular disease.  HISTORY OF PRESENT ILLNESS:  The patient is a 68 year old ambulatory female who has been under the care of Dr. Migdalia Dk.  She has undergone operative debridement of her left lower extremity with placement of ACell.  Her current wound care has been Endoform.  Over her right lower extremity, she has had a dry eschar over her medial first metatarsal head and she has been counseled by Dr. Migdalia Dk that she may eventually require an amputation on this side.  Since her last visit, she underwent a vascular procedure at [facility] over the right lower extremity.  She is accompanied by her family today, states that she underwent angioplasty with placement of two stents over the right lower extremity.  I have no records for review today.  She was also referred to Orthopedic Surgery who referred her to a podiatrist, named Dr. Elvina Mattes in [place].  They were seen by that physician earlier today and MRI of the right foot was ordered.  They are asking for communication with the [place] here regarding this patient.  She continues on Bactrim for recent wound culture.  I counseled her to finish this course of antibiotics and we will await her MRI and any further recommendations from her podiatrist prior to any further antibiotics.  She has a followup visit with her vascular surgeon next week.  She has had previous left lower extremity vascular interventions, which they report have all clotted.  Again, we have no records for review today.  On examination, her wounds are largely unchanged over the right foot.  There is a dry  eschar without cellulitis, that was over the dorsal metatarsal head down to bone.  However the left foot, there was a wound over her Achilles that was completely granulated with the ACell and corporated.  I will plan to continue Endoform over the left foot and wet-to-dry over the right lower extremity and we will follow up in 2 week's time.  No debridement was performed today.  ADDENDEUM: I spoke with Dr. Elvina Mattes and plan to await MRI and plan debridement vs amputation or ray resection as indicated. Counseled we can assist with IV antibiotic if indicated for treatment osteomyelitis, but this may be better suited at [facility] as her other physicians are there.          ______________________________ Irene Limbo, MD MBA     BT/MEDQ  D:  05/18/2014  T:  05/18/2014  Job:  [id number]

## 2014-05-27 ENCOUNTER — Ambulatory Visit: Payer: Self-pay | Admitting: Vascular Surgery

## 2014-05-27 HISTORY — PX: ENDOVASCULAR STENT INSERTION: SHX5161

## 2014-05-27 LAB — BASIC METABOLIC PANEL
ANION GAP: 6 — AB (ref 7–16)
BUN: 9 mg/dL (ref 7–18)
CO2: 25 mmol/L (ref 21–32)
Calcium, Total: 9.1 mg/dL (ref 8.5–10.1)
Chloride: 97 mmol/L — ABNORMAL LOW (ref 98–107)
Creatinine: 0.89 mg/dL (ref 0.60–1.30)
EGFR (African American): >60
EGFR (Non-African Amer.): >60
Glucose: 84 mg/dL (ref 65–99)
Osmolality: 255 (ref 275–301)
POTASSIUM: 4.2 mmol/L (ref 3.5–5.1)
Sodium: 128 mmol/L — ABNORMAL LOW (ref 136–145)

## 2014-06-01 ENCOUNTER — Encounter (HOSPITAL_BASED_OUTPATIENT_CLINIC_OR_DEPARTMENT_OTHER): Payer: Commercial Managed Care - HMO | Attending: Plastic Surgery

## 2014-06-01 DIAGNOSIS — L97321 Non-pressure chronic ulcer of left ankle limited to breakdown of skin: Secondary | ICD-10-CM | POA: Diagnosis not present

## 2014-06-01 DIAGNOSIS — L97511 Non-pressure chronic ulcer of other part of right foot limited to breakdown of skin: Secondary | ICD-10-CM | POA: Insufficient documentation

## 2014-06-01 DIAGNOSIS — I872 Venous insufficiency (chronic) (peripheral): Secondary | ICD-10-CM | POA: Diagnosis not present

## 2014-06-02 NOTE — Progress Notes (Signed)
[facility]  NAME:  Tracey Rogers, Tracey Rogers NO.:  [id number]  MEDICAL RECORD NO.:  [id number]      DATE OF BIRTH:  07-07-1945  PHYSICIAN:  Theodoro Kos, DO       VISIT DATE:  06/01/2014                                  OFFICE VISIT   HISTORY OF PRESENT ILLNESS:  The patient is a 69 year old female, who is here for followup on her bilateral lower extremity ulcers.  She has a left Achilles and a right great metatarsal ulcer.  She has severe vascular disease and recently underwent a procedure, which seems to have helped markedly well with improved color not only in her feet, but also in her face as well.  There has been no change in her medications and her daughter is still a big help to her.  PHYSICAL EXAMINATION:  She is alert, oriented, cooperative, not in any distress.  She is very pleasant.  She is very encouraged that she is feeling much better than she had been 2 weeks ago.  The bone is still exposed on the right metatarsal head and Podiatry is going to do an excision of that to try and salvage it to prevent an amputation.  Her breathing is unlabored.  Her heart rate is regular.  The pulse is present.  The left Achilles area is showing signs of granulation.  So, we will continue with collagen on the left, Santyl wet to dries on the right, and follow up in 1 week.  In the meantime, she is to continue with multivitamin, vitamin C, zinc.     Theodoro Kos, DO     CS/MEDQ  D:  06/01/2014  T:  06/02/2014  Job:  [id number]

## 2014-06-03 ENCOUNTER — Ambulatory Visit: Payer: Self-pay | Admitting: Podiatry

## 2014-06-03 DIAGNOSIS — Z01812 Encounter for preprocedural laboratory examination: Secondary | ICD-10-CM | POA: Diagnosis not present

## 2014-06-03 DIAGNOSIS — M19071 Primary osteoarthritis, right ankle and foot: Secondary | ICD-10-CM | POA: Diagnosis not present

## 2014-06-03 LAB — CBC WITH DIFFERENTIAL/PLATELET
BASOS ABS: 0.1 10*3/uL (ref 0.0–0.1)
Basophil %: 1.2 %
EOS ABS: 0.2 10*3/uL (ref 0.0–0.7)
Eosinophil %: 3.8 %
HCT: 29.1 % — ABNORMAL LOW (ref 35.0–47.0)
HGB: 9.5 g/dL — ABNORMAL LOW (ref 12.0–16.0)
LYMPHS ABS: 1.1 10*3/uL (ref 1.0–3.6)
Lymphocyte %: 16.6 %
MCH: 30.4 pg (ref 26.0–34.0)
MCHC: 32.6 g/dL (ref 32.0–36.0)
MCV: 93 fL (ref 80–100)
MONO ABS: 0.6 x10 3/mm (ref 0.2–0.9)
Monocyte %: 9.9 %
NEUTROS PCT: 68.5 %
Neutrophil #: 4.4 10*3/uL (ref 1.4–6.5)
PLATELETS: 356 10*3/uL (ref 150–440)
RBC: 3.12 10*6/uL — ABNORMAL LOW (ref 3.80–5.20)
RDW: 15.1 % — AB (ref 11.5–14.5)
WBC: 6.4 10*3/uL (ref 3.6–11.0)

## 2014-06-04 DIAGNOSIS — M869 Osteomyelitis, unspecified: Secondary | ICD-10-CM | POA: Diagnosis not present

## 2014-06-04 DIAGNOSIS — I96 Gangrene, not elsewhere classified: Secondary | ICD-10-CM | POA: Diagnosis not present

## 2014-06-04 DIAGNOSIS — M86671 Other chronic osteomyelitis, right ankle and foot: Secondary | ICD-10-CM | POA: Diagnosis not present

## 2014-06-05 ENCOUNTER — Ambulatory Visit: Payer: Self-pay | Admitting: Podiatry

## 2014-06-05 DIAGNOSIS — I739 Peripheral vascular disease, unspecified: Secondary | ICD-10-CM | POA: Diagnosis not present

## 2014-06-05 DIAGNOSIS — M868X7 Other osteomyelitis, ankle and foot: Secondary | ICD-10-CM | POA: Diagnosis not present

## 2014-06-05 DIAGNOSIS — Z7982 Long term (current) use of aspirin: Secondary | ICD-10-CM | POA: Diagnosis not present

## 2014-06-05 DIAGNOSIS — I96 Gangrene, not elsewhere classified: Secondary | ICD-10-CM | POA: Diagnosis not present

## 2014-06-05 DIAGNOSIS — I10 Essential (primary) hypertension: Secondary | ICD-10-CM | POA: Diagnosis not present

## 2014-06-05 DIAGNOSIS — M869 Osteomyelitis, unspecified: Secondary | ICD-10-CM | POA: Diagnosis not present

## 2014-06-05 DIAGNOSIS — M86671 Other chronic osteomyelitis, right ankle and foot: Secondary | ICD-10-CM | POA: Diagnosis not present

## 2014-06-05 DIAGNOSIS — L03031 Cellulitis of right toe: Secondary | ICD-10-CM | POA: Diagnosis not present

## 2014-06-05 DIAGNOSIS — Z79891 Long term (current) use of opiate analgesic: Secondary | ICD-10-CM | POA: Diagnosis not present

## 2014-06-05 DIAGNOSIS — M879 Osteonecrosis, unspecified: Secondary | ICD-10-CM | POA: Diagnosis not present

## 2014-06-05 DIAGNOSIS — F17200 Nicotine dependence, unspecified, uncomplicated: Secondary | ICD-10-CM | POA: Diagnosis not present

## 2014-06-09 DIAGNOSIS — I96 Gangrene, not elsewhere classified: Secondary | ICD-10-CM | POA: Diagnosis not present

## 2014-06-09 DIAGNOSIS — M86171 Other acute osteomyelitis, right ankle and foot: Secondary | ICD-10-CM | POA: Diagnosis not present

## 2014-06-09 DIAGNOSIS — I739 Peripheral vascular disease, unspecified: Secondary | ICD-10-CM | POA: Diagnosis not present

## 2014-06-09 LAB — WOUND CULTURE

## 2014-06-15 ENCOUNTER — Ambulatory Visit: Payer: Commercial Managed Care - HMO | Admitting: Podiatry

## 2014-06-15 ENCOUNTER — Ambulatory Visit: Payer: Commercial Managed Care - HMO

## 2014-06-15 DIAGNOSIS — L97511 Non-pressure chronic ulcer of other part of right foot limited to breakdown of skin: Secondary | ICD-10-CM | POA: Diagnosis not present

## 2014-06-15 DIAGNOSIS — I872 Venous insufficiency (chronic) (peripheral): Secondary | ICD-10-CM | POA: Diagnosis not present

## 2014-06-15 DIAGNOSIS — L97321 Non-pressure chronic ulcer of left ankle limited to breakdown of skin: Secondary | ICD-10-CM | POA: Diagnosis not present

## 2014-06-16 NOTE — Progress Notes (Signed)
[facility]  NAME:  Tracey Rogers, Tracey Rogers NO.:  [id number]  MEDICAL RECORD NO.:  [id number]      DATE OF BIRTH:  [birth year]  PHYSICIAN:  Theodoro Kos, DO       VISIT DATE:  06/15/2014                                  OFFICE VISIT   The patient is a 69 year old female, who is here for followup on her bilateral lower extremity ulcers.  She underwent surgical intervention on the right with resection of the necrotic bone and closure by the podiatrist for this is wraps, so we did not see that today.  On the left side, the Achilles had some fibrous tissue, but she has been using collagen on this and she underwent arterial surgery a few weeks ago and that seems to be helping remarkably.  There is no change in her medications or social history.  On exam, she is alert and oriented, cooperative.  Overall doing much better than she had been.  Her foot is now with good color.  It is not blue anymore.  She has a good pulse.  Debridement was done of the Achilles.  We will continue with collagen and wrap and see her back in 1 week.  We will also consider the possibility of ACell placement.  I do not want to do that right away because of the surgery she has had recently, so we will give her another week or two and then re-evaluate.     Theodoro Kos, DO     CS/MEDQ  D:  06/15/2014  T:  06/16/2014  Job:  [id number]

## 2014-06-17 DIAGNOSIS — I739 Peripheral vascular disease, unspecified: Secondary | ICD-10-CM | POA: Diagnosis not present

## 2014-06-17 DIAGNOSIS — L98499 Non-pressure chronic ulcer of skin of other sites with unspecified severity: Secondary | ICD-10-CM | POA: Diagnosis not present

## 2014-06-17 DIAGNOSIS — M86171 Other acute osteomyelitis, right ankle and foot: Secondary | ICD-10-CM | POA: Diagnosis not present

## 2014-06-18 DIAGNOSIS — I998 Other disorder of circulatory system: Secondary | ICD-10-CM | POA: Diagnosis not present

## 2014-06-18 DIAGNOSIS — I739 Peripheral vascular disease, unspecified: Secondary | ICD-10-CM | POA: Diagnosis not present

## 2014-06-18 DIAGNOSIS — I10 Essential (primary) hypertension: Secondary | ICD-10-CM | POA: Diagnosis not present

## 2014-06-18 DIAGNOSIS — E785 Hyperlipidemia, unspecified: Secondary | ICD-10-CM | POA: Diagnosis not present

## 2014-06-18 DIAGNOSIS — I70299 Other atherosclerosis of native arteries of extremities, unspecified extremity: Secondary | ICD-10-CM | POA: Diagnosis not present

## 2014-06-18 DIAGNOSIS — L97509 Non-pressure chronic ulcer of other part of unspecified foot with unspecified severity: Secondary | ICD-10-CM | POA: Diagnosis not present

## 2014-06-24 DIAGNOSIS — Z9181 History of falling: Secondary | ICD-10-CM | POA: Diagnosis not present

## 2014-06-24 DIAGNOSIS — L97329 Non-pressure chronic ulcer of left ankle with unspecified severity: Secondary | ICD-10-CM | POA: Diagnosis not present

## 2014-06-24 DIAGNOSIS — I872 Venous insufficiency (chronic) (peripheral): Secondary | ICD-10-CM | POA: Diagnosis not present

## 2014-06-24 DIAGNOSIS — I10 Essential (primary) hypertension: Secondary | ICD-10-CM | POA: Diagnosis not present

## 2014-06-24 DIAGNOSIS — I739 Peripheral vascular disease, unspecified: Secondary | ICD-10-CM | POA: Diagnosis not present

## 2014-07-01 DIAGNOSIS — B351 Tinea unguium: Secondary | ICD-10-CM | POA: Diagnosis not present

## 2014-07-01 DIAGNOSIS — M79674 Pain in right toe(s): Secondary | ICD-10-CM | POA: Diagnosis not present

## 2014-07-01 DIAGNOSIS — M79675 Pain in left toe(s): Secondary | ICD-10-CM | POA: Diagnosis not present

## 2014-07-02 DIAGNOSIS — L97329 Non-pressure chronic ulcer of left ankle with unspecified severity: Secondary | ICD-10-CM | POA: Diagnosis not present

## 2014-07-03 DIAGNOSIS — I872 Venous insufficiency (chronic) (peripheral): Secondary | ICD-10-CM | POA: Diagnosis not present

## 2014-07-03 DIAGNOSIS — I10 Essential (primary) hypertension: Secondary | ICD-10-CM | POA: Diagnosis not present

## 2014-07-03 DIAGNOSIS — L97329 Non-pressure chronic ulcer of left ankle with unspecified severity: Secondary | ICD-10-CM | POA: Diagnosis not present

## 2014-07-03 DIAGNOSIS — I739 Peripheral vascular disease, unspecified: Secondary | ICD-10-CM | POA: Diagnosis not present

## 2014-07-03 DIAGNOSIS — Z9181 History of falling: Secondary | ICD-10-CM | POA: Diagnosis not present

## 2014-07-06 ENCOUNTER — Encounter (HOSPITAL_BASED_OUTPATIENT_CLINIC_OR_DEPARTMENT_OTHER): Payer: Commercial Managed Care - HMO | Attending: Plastic Surgery

## 2014-07-06 DIAGNOSIS — I872 Venous insufficiency (chronic) (peripheral): Secondary | ICD-10-CM | POA: Diagnosis not present

## 2014-07-06 DIAGNOSIS — L97429 Non-pressure chronic ulcer of left heel and midfoot with unspecified severity: Secondary | ICD-10-CM | POA: Insufficient documentation

## 2014-07-06 DIAGNOSIS — L97329 Non-pressure chronic ulcer of left ankle with unspecified severity: Secondary | ICD-10-CM | POA: Diagnosis not present

## 2014-07-06 NOTE — Progress Notes (Signed)
[facility]  NAME:  RAKEB, KIBBLE                ACCOUNT NO.:  MEDICAL RECORD NO.:  [id number]      DATE OF BIRTH:  05-12-1946  PHYSICIAN:  Theodoro Kos, DO            VISIT DATE:                                  OFFICE VISIT   The patient is a 69 year old female, who is here for followup on her left lower extremity Achilles, chronic venous insufficiency, and arterial insufficiency ulcer.  She underwent debridement with ACell placement and is doing extremely well.  She is finally starting to epithelialize.  She has granulated and she is starting to epithelialize the wound.  There does not appear to be any sign of infection and she has a nice strong pulse with good color and good capillary refill.  She is breathing without difficulty.  No sign of infection.  We will continue with collagen and next week, we may add Endoform.  Elevation is important and we will see her back in 2 weeks.  Her right foot has healed and looks extremely well.     Theodoro Kos, DO     CS/MEDQ  D:  07/06/2014  T:  07/06/2014  Job:  [id number]

## 2014-07-10 DIAGNOSIS — I739 Peripheral vascular disease, unspecified: Secondary | ICD-10-CM | POA: Diagnosis not present

## 2014-07-10 DIAGNOSIS — I10 Essential (primary) hypertension: Secondary | ICD-10-CM | POA: Diagnosis not present

## 2014-07-10 DIAGNOSIS — L97329 Non-pressure chronic ulcer of left ankle with unspecified severity: Secondary | ICD-10-CM | POA: Diagnosis not present

## 2014-07-10 DIAGNOSIS — I872 Venous insufficiency (chronic) (peripheral): Secondary | ICD-10-CM | POA: Diagnosis not present

## 2014-07-10 DIAGNOSIS — Z9181 History of falling: Secondary | ICD-10-CM | POA: Diagnosis not present

## 2014-07-15 DIAGNOSIS — L97519 Non-pressure chronic ulcer of other part of right foot with unspecified severity: Secondary | ICD-10-CM | POA: Diagnosis not present

## 2014-07-15 DIAGNOSIS — M86171 Other acute osteomyelitis, right ankle and foot: Secondary | ICD-10-CM | POA: Diagnosis not present

## 2014-07-15 DIAGNOSIS — I739 Peripheral vascular disease, unspecified: Secondary | ICD-10-CM | POA: Diagnosis not present

## 2014-07-15 DIAGNOSIS — L98499 Non-pressure chronic ulcer of skin of other sites with unspecified severity: Secondary | ICD-10-CM | POA: Diagnosis not present

## 2014-07-17 DIAGNOSIS — H43813 Vitreous degeneration, bilateral: Secondary | ICD-10-CM | POA: Diagnosis not present

## 2014-07-17 DIAGNOSIS — I872 Venous insufficiency (chronic) (peripheral): Secondary | ICD-10-CM | POA: Diagnosis not present

## 2014-07-17 DIAGNOSIS — I10 Essential (primary) hypertension: Secondary | ICD-10-CM | POA: Diagnosis not present

## 2014-07-17 DIAGNOSIS — Z9181 History of falling: Secondary | ICD-10-CM | POA: Diagnosis not present

## 2014-07-17 DIAGNOSIS — L97329 Non-pressure chronic ulcer of left ankle with unspecified severity: Secondary | ICD-10-CM | POA: Diagnosis not present

## 2014-07-17 DIAGNOSIS — I739 Peripheral vascular disease, unspecified: Secondary | ICD-10-CM | POA: Diagnosis not present

## 2014-07-20 DIAGNOSIS — L97429 Non-pressure chronic ulcer of left heel and midfoot with unspecified severity: Secondary | ICD-10-CM | POA: Diagnosis not present

## 2014-07-20 DIAGNOSIS — I872 Venous insufficiency (chronic) (peripheral): Secondary | ICD-10-CM | POA: Diagnosis not present

## 2014-07-21 NOTE — Progress Notes (Signed)
[facility]  NAME:  Tracey Rogers, Tracey Rogers                ACCOUNT NO.:  MEDICAL RECORD NO.:  [id number]      DATE OF BIRTH:  07/24/1945  PHYSICIAN:  Theodoro Kos, DO            VISIT DATE:                                  OFFICE VISIT   The patient is a 69 year old female who is here for a followup on her left Achilles ulcer.  Overall, she is doing very well after the revascularizations.  She is showing wonderful signs of improvement.  She is alert, oriented, cooperative, not in any distress.  She is very pleasant.  Her breathing is unlabored.  Her heart rate is regular.  Her pulse is strong.  Her leg is very warm with really good capillary refill.  The plan will be for collagen, Kerlix, ACE wrap, and followup in 2 weeks.     Theodoro Kos, DO     CS/MEDQ  D:  07/20/2014  T:  07/21/2014  Job:  [id number]

## 2014-07-22 DIAGNOSIS — L97329 Non-pressure chronic ulcer of left ankle with unspecified severity: Secondary | ICD-10-CM | POA: Diagnosis not present

## 2014-07-22 DIAGNOSIS — I872 Venous insufficiency (chronic) (peripheral): Secondary | ICD-10-CM | POA: Diagnosis not present

## 2014-07-22 DIAGNOSIS — I10 Essential (primary) hypertension: Secondary | ICD-10-CM | POA: Diagnosis not present

## 2014-07-24 DIAGNOSIS — L97329 Non-pressure chronic ulcer of left ankle with unspecified severity: Secondary | ICD-10-CM | POA: Diagnosis not present

## 2014-07-24 DIAGNOSIS — Z9181 History of falling: Secondary | ICD-10-CM | POA: Diagnosis not present

## 2014-07-24 DIAGNOSIS — I10 Essential (primary) hypertension: Secondary | ICD-10-CM | POA: Diagnosis not present

## 2014-07-24 DIAGNOSIS — I872 Venous insufficiency (chronic) (peripheral): Secondary | ICD-10-CM | POA: Diagnosis not present

## 2014-07-24 DIAGNOSIS — I739 Peripheral vascular disease, unspecified: Secondary | ICD-10-CM | POA: Diagnosis not present

## 2014-07-31 DIAGNOSIS — I739 Peripheral vascular disease, unspecified: Secondary | ICD-10-CM | POA: Diagnosis not present

## 2014-07-31 DIAGNOSIS — D649 Anemia, unspecified: Secondary | ICD-10-CM | POA: Diagnosis not present

## 2014-07-31 DIAGNOSIS — Z9181 History of falling: Secondary | ICD-10-CM | POA: Diagnosis not present

## 2014-07-31 DIAGNOSIS — I872 Venous insufficiency (chronic) (peripheral): Secondary | ICD-10-CM | POA: Diagnosis not present

## 2014-07-31 DIAGNOSIS — I10 Essential (primary) hypertension: Secondary | ICD-10-CM | POA: Diagnosis not present

## 2014-07-31 DIAGNOSIS — L97321 Non-pressure chronic ulcer of left ankle limited to breakdown of skin: Secondary | ICD-10-CM | POA: Diagnosis not present

## 2014-08-03 ENCOUNTER — Encounter (HOSPITAL_BASED_OUTPATIENT_CLINIC_OR_DEPARTMENT_OTHER): Payer: Commercial Managed Care - HMO | Attending: Plastic Surgery

## 2014-08-03 DIAGNOSIS — L97501 Non-pressure chronic ulcer of other part of unspecified foot limited to breakdown of skin: Secondary | ICD-10-CM | POA: Diagnosis not present

## 2014-08-03 DIAGNOSIS — I87312 Chronic venous hypertension (idiopathic) with ulcer of left lower extremity: Secondary | ICD-10-CM | POA: Diagnosis not present

## 2014-08-03 DIAGNOSIS — I739 Peripheral vascular disease, unspecified: Secondary | ICD-10-CM | POA: Insufficient documentation

## 2014-08-04 DIAGNOSIS — I872 Venous insufficiency (chronic) (peripheral): Secondary | ICD-10-CM | POA: Diagnosis not present

## 2014-08-04 DIAGNOSIS — I739 Peripheral vascular disease, unspecified: Secondary | ICD-10-CM | POA: Diagnosis not present

## 2014-08-04 DIAGNOSIS — I10 Essential (primary) hypertension: Secondary | ICD-10-CM | POA: Diagnosis not present

## 2014-08-04 DIAGNOSIS — L97329 Non-pressure chronic ulcer of left ankle with unspecified severity: Secondary | ICD-10-CM | POA: Diagnosis not present

## 2014-08-07 DIAGNOSIS — L97321 Non-pressure chronic ulcer of left ankle limited to breakdown of skin: Secondary | ICD-10-CM | POA: Diagnosis not present

## 2014-08-07 DIAGNOSIS — I739 Peripheral vascular disease, unspecified: Secondary | ICD-10-CM | POA: Diagnosis not present

## 2014-08-07 DIAGNOSIS — I872 Venous insufficiency (chronic) (peripheral): Secondary | ICD-10-CM | POA: Diagnosis not present

## 2014-08-07 DIAGNOSIS — Z9181 History of falling: Secondary | ICD-10-CM | POA: Diagnosis not present

## 2014-08-07 DIAGNOSIS — D649 Anemia, unspecified: Secondary | ICD-10-CM | POA: Diagnosis not present

## 2014-08-07 DIAGNOSIS — I10 Essential (primary) hypertension: Secondary | ICD-10-CM | POA: Diagnosis not present

## 2014-08-10 DIAGNOSIS — L97329 Non-pressure chronic ulcer of left ankle with unspecified severity: Secondary | ICD-10-CM | POA: Diagnosis not present

## 2014-08-10 DIAGNOSIS — I87312 Chronic venous hypertension (idiopathic) with ulcer of left lower extremity: Secondary | ICD-10-CM | POA: Diagnosis not present

## 2014-08-11 DIAGNOSIS — I872 Venous insufficiency (chronic) (peripheral): Secondary | ICD-10-CM | POA: Diagnosis not present

## 2014-08-11 DIAGNOSIS — I10 Essential (primary) hypertension: Secondary | ICD-10-CM | POA: Diagnosis not present

## 2014-08-11 DIAGNOSIS — L97321 Non-pressure chronic ulcer of left ankle limited to breakdown of skin: Secondary | ICD-10-CM | POA: Diagnosis not present

## 2014-08-11 DIAGNOSIS — I739 Peripheral vascular disease, unspecified: Secondary | ICD-10-CM | POA: Diagnosis not present

## 2014-08-11 DIAGNOSIS — Z9181 History of falling: Secondary | ICD-10-CM | POA: Diagnosis not present

## 2014-08-11 DIAGNOSIS — D649 Anemia, unspecified: Secondary | ICD-10-CM | POA: Diagnosis not present

## 2014-08-12 DIAGNOSIS — D649 Anemia, unspecified: Secondary | ICD-10-CM | POA: Diagnosis not present

## 2014-08-12 DIAGNOSIS — I872 Venous insufficiency (chronic) (peripheral): Secondary | ICD-10-CM | POA: Diagnosis not present

## 2014-08-12 DIAGNOSIS — Z9181 History of falling: Secondary | ICD-10-CM | POA: Diagnosis not present

## 2014-08-12 DIAGNOSIS — I10 Essential (primary) hypertension: Secondary | ICD-10-CM | POA: Diagnosis not present

## 2014-08-12 DIAGNOSIS — L97321 Non-pressure chronic ulcer of left ankle limited to breakdown of skin: Secondary | ICD-10-CM | POA: Diagnosis not present

## 2014-08-12 DIAGNOSIS — I739 Peripheral vascular disease, unspecified: Secondary | ICD-10-CM | POA: Diagnosis not present

## 2014-08-15 DIAGNOSIS — Z9181 History of falling: Secondary | ICD-10-CM | POA: Diagnosis not present

## 2014-08-15 DIAGNOSIS — I872 Venous insufficiency (chronic) (peripheral): Secondary | ICD-10-CM | POA: Diagnosis not present

## 2014-08-15 DIAGNOSIS — I10 Essential (primary) hypertension: Secondary | ICD-10-CM | POA: Diagnosis not present

## 2014-08-15 DIAGNOSIS — L97321 Non-pressure chronic ulcer of left ankle limited to breakdown of skin: Secondary | ICD-10-CM | POA: Diagnosis not present

## 2014-08-15 DIAGNOSIS — D649 Anemia, unspecified: Secondary | ICD-10-CM | POA: Diagnosis not present

## 2014-08-15 DIAGNOSIS — I739 Peripheral vascular disease, unspecified: Secondary | ICD-10-CM | POA: Diagnosis not present

## 2014-08-17 DIAGNOSIS — L97329 Non-pressure chronic ulcer of left ankle with unspecified severity: Secondary | ICD-10-CM | POA: Diagnosis not present

## 2014-08-17 DIAGNOSIS — I739 Peripheral vascular disease, unspecified: Secondary | ICD-10-CM | POA: Diagnosis not present

## 2014-08-17 DIAGNOSIS — I87312 Chronic venous hypertension (idiopathic) with ulcer of left lower extremity: Secondary | ICD-10-CM | POA: Diagnosis not present

## 2014-08-17 DIAGNOSIS — L97501 Non-pressure chronic ulcer of other part of unspecified foot limited to breakdown of skin: Secondary | ICD-10-CM | POA: Diagnosis not present

## 2014-08-17 DIAGNOSIS — I872 Venous insufficiency (chronic) (peripheral): Secondary | ICD-10-CM | POA: Diagnosis not present

## 2014-08-18 DIAGNOSIS — I872 Venous insufficiency (chronic) (peripheral): Secondary | ICD-10-CM | POA: Diagnosis not present

## 2014-08-18 DIAGNOSIS — I739 Peripheral vascular disease, unspecified: Secondary | ICD-10-CM | POA: Diagnosis not present

## 2014-08-18 DIAGNOSIS — I10 Essential (primary) hypertension: Secondary | ICD-10-CM | POA: Diagnosis not present

## 2014-08-18 DIAGNOSIS — L97321 Non-pressure chronic ulcer of left ankle limited to breakdown of skin: Secondary | ICD-10-CM | POA: Diagnosis not present

## 2014-08-18 DIAGNOSIS — Z9181 History of falling: Secondary | ICD-10-CM | POA: Diagnosis not present

## 2014-08-18 DIAGNOSIS — D649 Anemia, unspecified: Secondary | ICD-10-CM | POA: Diagnosis not present

## 2014-08-20 DIAGNOSIS — I872 Venous insufficiency (chronic) (peripheral): Secondary | ICD-10-CM | POA: Diagnosis not present

## 2014-08-20 DIAGNOSIS — Z9181 History of falling: Secondary | ICD-10-CM | POA: Diagnosis not present

## 2014-08-20 DIAGNOSIS — D649 Anemia, unspecified: Secondary | ICD-10-CM | POA: Diagnosis not present

## 2014-08-20 DIAGNOSIS — L97321 Non-pressure chronic ulcer of left ankle limited to breakdown of skin: Secondary | ICD-10-CM | POA: Diagnosis not present

## 2014-08-20 DIAGNOSIS — I10 Essential (primary) hypertension: Secondary | ICD-10-CM | POA: Diagnosis not present

## 2014-08-20 DIAGNOSIS — I739 Peripheral vascular disease, unspecified: Secondary | ICD-10-CM | POA: Diagnosis not present

## 2014-08-21 DIAGNOSIS — I10 Essential (primary) hypertension: Secondary | ICD-10-CM | POA: Diagnosis not present

## 2014-08-21 DIAGNOSIS — L97321 Non-pressure chronic ulcer of left ankle limited to breakdown of skin: Secondary | ICD-10-CM | POA: Diagnosis not present

## 2014-08-21 DIAGNOSIS — Z9181 History of falling: Secondary | ICD-10-CM | POA: Diagnosis not present

## 2014-08-21 DIAGNOSIS — I739 Peripheral vascular disease, unspecified: Secondary | ICD-10-CM | POA: Diagnosis not present

## 2014-08-21 DIAGNOSIS — D649 Anemia, unspecified: Secondary | ICD-10-CM | POA: Diagnosis not present

## 2014-08-21 DIAGNOSIS — I872 Venous insufficiency (chronic) (peripheral): Secondary | ICD-10-CM | POA: Diagnosis not present

## 2014-08-25 DIAGNOSIS — D649 Anemia, unspecified: Secondary | ICD-10-CM | POA: Diagnosis not present

## 2014-08-25 DIAGNOSIS — L97321 Non-pressure chronic ulcer of left ankle limited to breakdown of skin: Secondary | ICD-10-CM | POA: Diagnosis not present

## 2014-08-25 DIAGNOSIS — I872 Venous insufficiency (chronic) (peripheral): Secondary | ICD-10-CM | POA: Diagnosis not present

## 2014-08-25 DIAGNOSIS — Z9181 History of falling: Secondary | ICD-10-CM | POA: Diagnosis not present

## 2014-08-25 DIAGNOSIS — I739 Peripheral vascular disease, unspecified: Secondary | ICD-10-CM | POA: Diagnosis not present

## 2014-08-25 DIAGNOSIS — I10 Essential (primary) hypertension: Secondary | ICD-10-CM | POA: Diagnosis not present

## 2014-08-27 DIAGNOSIS — D649 Anemia, unspecified: Secondary | ICD-10-CM | POA: Diagnosis not present

## 2014-08-27 DIAGNOSIS — Z9181 History of falling: Secondary | ICD-10-CM | POA: Diagnosis not present

## 2014-08-27 DIAGNOSIS — I10 Essential (primary) hypertension: Secondary | ICD-10-CM | POA: Diagnosis not present

## 2014-08-27 DIAGNOSIS — I739 Peripheral vascular disease, unspecified: Secondary | ICD-10-CM | POA: Diagnosis not present

## 2014-08-27 DIAGNOSIS — L97321 Non-pressure chronic ulcer of left ankle limited to breakdown of skin: Secondary | ICD-10-CM | POA: Diagnosis not present

## 2014-08-27 DIAGNOSIS — I872 Venous insufficiency (chronic) (peripheral): Secondary | ICD-10-CM | POA: Diagnosis not present

## 2014-08-28 DIAGNOSIS — Z9181 History of falling: Secondary | ICD-10-CM | POA: Diagnosis not present

## 2014-08-28 DIAGNOSIS — L97321 Non-pressure chronic ulcer of left ankle limited to breakdown of skin: Secondary | ICD-10-CM | POA: Diagnosis not present

## 2014-08-28 DIAGNOSIS — I739 Peripheral vascular disease, unspecified: Secondary | ICD-10-CM | POA: Diagnosis not present

## 2014-08-28 DIAGNOSIS — D649 Anemia, unspecified: Secondary | ICD-10-CM | POA: Diagnosis not present

## 2014-08-28 DIAGNOSIS — I872 Venous insufficiency (chronic) (peripheral): Secondary | ICD-10-CM | POA: Diagnosis not present

## 2014-08-28 DIAGNOSIS — I10 Essential (primary) hypertension: Secondary | ICD-10-CM | POA: Diagnosis not present

## 2014-08-31 ENCOUNTER — Encounter (HOSPITAL_BASED_OUTPATIENT_CLINIC_OR_DEPARTMENT_OTHER): Payer: Commercial Managed Care - HMO | Attending: Plastic Surgery

## 2014-08-31 DIAGNOSIS — I87312 Chronic venous hypertension (idiopathic) with ulcer of left lower extremity: Secondary | ICD-10-CM | POA: Diagnosis not present

## 2014-08-31 DIAGNOSIS — L97321 Non-pressure chronic ulcer of left ankle limited to breakdown of skin: Secondary | ICD-10-CM | POA: Insufficient documentation

## 2014-08-31 DIAGNOSIS — I739 Peripheral vascular disease, unspecified: Secondary | ICD-10-CM | POA: Diagnosis not present

## 2014-09-01 DIAGNOSIS — D649 Anemia, unspecified: Secondary | ICD-10-CM | POA: Diagnosis not present

## 2014-09-01 DIAGNOSIS — I10 Essential (primary) hypertension: Secondary | ICD-10-CM | POA: Diagnosis not present

## 2014-09-01 DIAGNOSIS — I872 Venous insufficiency (chronic) (peripheral): Secondary | ICD-10-CM | POA: Diagnosis not present

## 2014-09-01 DIAGNOSIS — I739 Peripheral vascular disease, unspecified: Secondary | ICD-10-CM | POA: Diagnosis not present

## 2014-09-01 DIAGNOSIS — L97321 Non-pressure chronic ulcer of left ankle limited to breakdown of skin: Secondary | ICD-10-CM | POA: Diagnosis not present

## 2014-09-01 DIAGNOSIS — Z9181 History of falling: Secondary | ICD-10-CM | POA: Diagnosis not present

## 2014-09-03 DIAGNOSIS — I739 Peripheral vascular disease, unspecified: Secondary | ICD-10-CM | POA: Diagnosis not present

## 2014-09-03 DIAGNOSIS — Z9181 History of falling: Secondary | ICD-10-CM | POA: Diagnosis not present

## 2014-09-03 DIAGNOSIS — D649 Anemia, unspecified: Secondary | ICD-10-CM | POA: Diagnosis not present

## 2014-09-03 DIAGNOSIS — I872 Venous insufficiency (chronic) (peripheral): Secondary | ICD-10-CM | POA: Diagnosis not present

## 2014-09-03 DIAGNOSIS — L97321 Non-pressure chronic ulcer of left ankle limited to breakdown of skin: Secondary | ICD-10-CM | POA: Diagnosis not present

## 2014-09-03 DIAGNOSIS — I10 Essential (primary) hypertension: Secondary | ICD-10-CM | POA: Diagnosis not present

## 2014-09-04 ENCOUNTER — Encounter (HOSPITAL_BASED_OUTPATIENT_CLINIC_OR_DEPARTMENT_OTHER): Payer: Self-pay | Admitting: *Deleted

## 2014-09-04 DIAGNOSIS — D649 Anemia, unspecified: Secondary | ICD-10-CM | POA: Diagnosis not present

## 2014-09-04 DIAGNOSIS — Z9181 History of falling: Secondary | ICD-10-CM | POA: Diagnosis not present

## 2014-09-04 DIAGNOSIS — I10 Essential (primary) hypertension: Secondary | ICD-10-CM | POA: Diagnosis not present

## 2014-09-04 DIAGNOSIS — L97321 Non-pressure chronic ulcer of left ankle limited to breakdown of skin: Secondary | ICD-10-CM | POA: Diagnosis not present

## 2014-09-04 DIAGNOSIS — I872 Venous insufficiency (chronic) (peripheral): Secondary | ICD-10-CM | POA: Diagnosis not present

## 2014-09-04 DIAGNOSIS — I739 Peripheral vascular disease, unspecified: Secondary | ICD-10-CM | POA: Diagnosis not present

## 2014-09-04 NOTE — Progress Notes (Signed)
Will need istat- 

## 2014-09-07 DIAGNOSIS — Z9181 History of falling: Secondary | ICD-10-CM | POA: Diagnosis not present

## 2014-09-07 DIAGNOSIS — D649 Anemia, unspecified: Secondary | ICD-10-CM | POA: Diagnosis not present

## 2014-09-07 DIAGNOSIS — I739 Peripheral vascular disease, unspecified: Secondary | ICD-10-CM | POA: Diagnosis not present

## 2014-09-07 DIAGNOSIS — L97321 Non-pressure chronic ulcer of left ankle limited to breakdown of skin: Secondary | ICD-10-CM | POA: Diagnosis not present

## 2014-09-07 DIAGNOSIS — I872 Venous insufficiency (chronic) (peripheral): Secondary | ICD-10-CM | POA: Diagnosis not present

## 2014-09-07 DIAGNOSIS — I10 Essential (primary) hypertension: Secondary | ICD-10-CM | POA: Diagnosis not present

## 2014-09-08 ENCOUNTER — Other Ambulatory Visit: Payer: Self-pay | Admitting: Plastic Surgery

## 2014-09-08 DIAGNOSIS — S81802D Unspecified open wound, left lower leg, subsequent encounter: Principal | ICD-10-CM

## 2014-09-08 DIAGNOSIS — S81002D Unspecified open wound, left knee, subsequent encounter: Secondary | ICD-10-CM

## 2014-09-08 DIAGNOSIS — S91002D Unspecified open wound, left ankle, subsequent encounter: Principal | ICD-10-CM

## 2014-09-10 ENCOUNTER — Encounter (HOSPITAL_BASED_OUTPATIENT_CLINIC_OR_DEPARTMENT_OTHER)
Admission: RE | Disposition: A | Discharge status: Home / Self Care | Payer: Self-pay | Source: Ambulatory Visit | Attending: Plastic Surgery

## 2014-09-10 ENCOUNTER — Ambulatory Visit (HOSPITAL_BASED_OUTPATIENT_CLINIC_OR_DEPARTMENT_OTHER)
Admission: RE | Admit: 2014-09-10 | Discharge: 2014-09-10 | Disposition: A | Discharge status: Home / Self Care | Payer: Commercial Managed Care - HMO | Source: Ambulatory Visit | Attending: Plastic Surgery | Admitting: Plastic Surgery

## 2014-09-10 ENCOUNTER — Encounter (HOSPITAL_BASED_OUTPATIENT_CLINIC_OR_DEPARTMENT_OTHER): Payer: Self-pay | Admitting: *Deleted

## 2014-09-10 ENCOUNTER — Ambulatory Visit (HOSPITAL_BASED_OUTPATIENT_CLINIC_OR_DEPARTMENT_OTHER): Payer: Commercial Managed Care - HMO | Admitting: Anesthesiology

## 2014-09-10 DIAGNOSIS — I739 Peripheral vascular disease, unspecified: Secondary | ICD-10-CM | POA: Insufficient documentation

## 2014-09-10 DIAGNOSIS — J449 Chronic obstructive pulmonary disease, unspecified: Secondary | ICD-10-CM | POA: Diagnosis not present

## 2014-09-10 DIAGNOSIS — Z9889 Other specified postprocedural states: Secondary | ICD-10-CM | POA: Insufficient documentation

## 2014-09-10 DIAGNOSIS — L409 Psoriasis, unspecified: Secondary | ICD-10-CM | POA: Diagnosis not present

## 2014-09-10 DIAGNOSIS — Z791 Long term (current) use of non-steroidal anti-inflammatories (NSAID): Secondary | ICD-10-CM | POA: Diagnosis not present

## 2014-09-10 DIAGNOSIS — Z7902 Long term (current) use of antithrombotics/antiplatelets: Secondary | ICD-10-CM | POA: Insufficient documentation

## 2014-09-10 DIAGNOSIS — Z7952 Long term (current) use of systemic steroids: Secondary | ICD-10-CM | POA: Insufficient documentation

## 2014-09-10 DIAGNOSIS — Z79891 Long term (current) use of opiate analgesic: Secondary | ICD-10-CM | POA: Insufficient documentation

## 2014-09-10 DIAGNOSIS — Z79899 Other long term (current) drug therapy: Secondary | ICD-10-CM | POA: Diagnosis not present

## 2014-09-10 DIAGNOSIS — S81802D Unspecified open wound, left lower leg, subsequent encounter: Secondary | ICD-10-CM

## 2014-09-10 DIAGNOSIS — L97329 Non-pressure chronic ulcer of left ankle with unspecified severity: Secondary | ICD-10-CM | POA: Diagnosis not present

## 2014-09-10 DIAGNOSIS — S91002A Unspecified open wound, left ankle, initial encounter: Secondary | ICD-10-CM | POA: Diagnosis present

## 2014-09-10 DIAGNOSIS — M1990 Unspecified osteoarthritis, unspecified site: Secondary | ICD-10-CM | POA: Insufficient documentation

## 2014-09-10 DIAGNOSIS — I10 Essential (primary) hypertension: Secondary | ICD-10-CM | POA: Insufficient documentation

## 2014-09-10 DIAGNOSIS — Z87891 Personal history of nicotine dependence: Secondary | ICD-10-CM | POA: Diagnosis not present

## 2014-09-10 DIAGNOSIS — Z7982 Long term (current) use of aspirin: Secondary | ICD-10-CM | POA: Diagnosis not present

## 2014-09-10 DIAGNOSIS — S91002D Unspecified open wound, left ankle, subsequent encounter: Secondary | ICD-10-CM

## 2014-09-10 DIAGNOSIS — S91012A Laceration without foreign body, left ankle, initial encounter: Secondary | ICD-10-CM | POA: Diagnosis not present

## 2014-09-10 DIAGNOSIS — S81002D Unspecified open wound, left knee, subsequent encounter: Secondary | ICD-10-CM

## 2014-09-10 DIAGNOSIS — L97921 Non-pressure chronic ulcer of unspecified part of left lower leg limited to breakdown of skin: Secondary | ICD-10-CM | POA: Diagnosis not present

## 2014-09-10 HISTORY — PX: APPLICATION OF A-CELL OF EXTREMITY: SHX6303

## 2014-09-10 HISTORY — DX: Other complications of anesthesia, initial encounter: T88.59XA

## 2014-09-10 HISTORY — DX: Adverse effect of unspecified anesthetic, initial encounter: T41.45XA

## 2014-09-10 HISTORY — PX: I & D EXTREMITY: SHX5045

## 2014-09-10 LAB — POCT I-STAT, CHEM 8
BUN: 11 mg/dL (ref 6–23)
CALCIUM ION: 1.26 mmol/L (ref 1.13–1.30)
Chloride: 101 mmol/L (ref 96–112)
Creatinine, Ser: 0.6 mg/dL (ref 0.50–1.10)
Glucose, Bld: 92 mg/dL (ref 70–99)
HCT: 38 % (ref 36.0–46.0)
HEMOGLOBIN: 12.9 g/dL (ref 12.0–15.0)
Potassium: 4.1 mmol/L (ref 3.5–5.1)
Sodium: 139 mmol/L (ref 135–145)
TCO2: 22 mmol/L (ref 0–100)

## 2014-09-10 SURGERY — IRRIGATION AND DEBRIDEMENT EXTREMITY
Anesthesia: General | Site: Leg Lower | Laterality: Left

## 2014-09-10 MED ORDER — CEFAZOLIN SODIUM-DEXTROSE 2-3 GM-% IV SOLR
2.0000 g | INTRAVENOUS | Status: AC
  Administered 2014-09-10: 2 g via INTRAVENOUS

## 2014-09-10 MED ORDER — SODIUM CHLORIDE 0.9 % IR SOLN
Status: DC | PRN
  Administered 2014-09-10: 500 mL

## 2014-09-10 MED ORDER — PROPOFOL 10 MG/ML IV BOLUS
INTRAVENOUS | Status: DC | PRN
  Administered 2014-09-10: 200 mg via INTRAVENOUS

## 2014-09-10 MED ORDER — MIDAZOLAM HCL 2 MG/2ML IJ SOLN: 1.0000 mg | INTRAMUSCULAR | Status: DC | PRN

## 2014-09-10 MED ORDER — LIDOCAINE HCL (CARDIAC) 20 MG/ML IV SOLN
INTRAVENOUS | Status: DC | PRN
  Administered 2014-09-10: 50 mg via INTRAVENOUS

## 2014-09-10 MED ORDER — FENTANYL CITRATE 0.05 MG/ML IJ SOLN
INTRAMUSCULAR | Status: AC
  Filled 2014-09-10: qty 2

## 2014-09-10 MED ORDER — LACTATED RINGERS IV SOLN
INTRAVENOUS | Status: DC
  Administered 2014-09-10: 11:00:00 via INTRAVENOUS

## 2014-09-10 MED ORDER — FENTANYL CITRATE 0.05 MG/ML IJ SOLN
25.0000 ug | INTRAMUSCULAR | Status: DC | PRN
  Administered 2014-09-10: 25 ug via INTRAVENOUS

## 2014-09-10 MED ORDER — DEXAMETHASONE SODIUM PHOSPHATE 4 MG/ML IJ SOLN
INTRAMUSCULAR | Status: DC | PRN
  Administered 2014-09-10: 8 mg via INTRAVENOUS

## 2014-09-10 MED ORDER — MIDAZOLAM HCL 2 MG/2ML IJ SOLN
INTRAMUSCULAR | Status: AC
  Filled 2014-09-10: qty 2

## 2014-09-10 MED ORDER — LIDOCAINE-EPINEPHRINE 1 %-1:100000 IJ SOLN
INTRAMUSCULAR | Status: AC
  Filled 2014-09-10: qty 1

## 2014-09-10 MED ORDER — CEFAZOLIN SODIUM-DEXTROSE 2-3 GM-% IV SOLR
INTRAVENOUS | Status: AC
  Filled 2014-09-10: qty 50

## 2014-09-10 MED ORDER — ONDANSETRON HCL 4 MG/2ML IJ SOLN
INTRAMUSCULAR | Status: DC | PRN
  Administered 2014-09-10: 4 mg via INTRAVENOUS

## 2014-09-10 MED ORDER — BUPIVACAINE-EPINEPHRINE (PF) 0.25% -1:200000 IJ SOLN
INTRAMUSCULAR | Status: AC
  Filled 2014-09-10: qty 30

## 2014-09-10 MED ORDER — FENTANYL CITRATE 0.05 MG/ML IJ SOLN
INTRAMUSCULAR | Status: AC
  Filled 2014-09-10: qty 6

## 2014-09-10 MED ORDER — FENTANYL CITRATE 0.05 MG/ML IJ SOLN
50.0000 ug | INTRAMUSCULAR | Status: DC | PRN
  Administered 2014-09-10 (×2): 50 ug via INTRAVENOUS

## 2014-09-10 SURGICAL SUPPLY — 60 items
BAG DECANTER FOR FLEXI CONT (MISCELLANEOUS) ×3 IMPLANT
BANDAGE ELASTIC 3 VELCRO ST LF (GAUZE/BANDAGES/DRESSINGS) IMPLANT
BANDAGE ELASTIC 4 VELCRO ST LF (GAUZE/BANDAGES/DRESSINGS) ×6 IMPLANT
BANDAGE ELASTIC 6 VELCRO ST LF (GAUZE/BANDAGES/DRESSINGS) IMPLANT
BLADE HEX COATED 2.75 (ELECTRODE) IMPLANT
BLADE SURG 10 STRL SS (BLADE) IMPLANT
BLADE SURG 15 STRL LF DISP TIS (BLADE) ×2 IMPLANT
BLADE SURG 15 STRL SS (BLADE) ×1
BNDG COHESIVE 4X5 TAN STRL (GAUZE/BANDAGES/DRESSINGS) IMPLANT
BNDG GAUZE ELAST 4 BULKY (GAUZE/BANDAGES/DRESSINGS) ×3 IMPLANT
CANISTER SUCT 1200ML W/VALVE (MISCELLANEOUS) IMPLANT
CHLORAPREP W/TINT 26ML (MISCELLANEOUS) IMPLANT
COVER BACK TABLE 60X90IN (DRAPES) ×3 IMPLANT
DECANTER SPIKE VIAL GLASS SM (MISCELLANEOUS) IMPLANT
DRAPE INCISE IOBAN 66X45 STRL (DRAPES) ×3 IMPLANT
DRAPE U-SHAPE 76X120 STRL (DRAPES) ×3 IMPLANT
DRSG ADAPTIC 3X8 NADH LF (GAUZE/BANDAGES/DRESSINGS) IMPLANT
DRSG EMULSION OIL 3X3 NADH (GAUZE/BANDAGES/DRESSINGS) IMPLANT
DRSG PAD ABDOMINAL 8X10 ST (GAUZE/BANDAGES/DRESSINGS) IMPLANT
ELECT REM PT RETURN 9FT ADLT (ELECTROSURGICAL) ×3
ELECTRODE REM PT RTRN 9FT ADLT (ELECTROSURGICAL) ×2 IMPLANT
GAUZE SPONGE 4X4 12PLY STRL (GAUZE/BANDAGES/DRESSINGS) ×3 IMPLANT
GAUZE XEROFORM 1X8 LF (GAUZE/BANDAGES/DRESSINGS) IMPLANT
GAUZE XEROFORM 5X9 LF (GAUZE/BANDAGES/DRESSINGS) IMPLANT
GLOVE BIO SURGEON STRL SZ 6.5 (GLOVE) ×9 IMPLANT
GLOVE SURG SS PI 7.0 STRL IVOR (GLOVE) ×3 IMPLANT
GOWN STRL REUS W/ TWL LRG LVL3 (GOWN DISPOSABLE) ×4 IMPLANT
GOWN STRL REUS W/TWL LRG LVL3 (GOWN DISPOSABLE) ×2
MANIFOLD NEPTUNE II (INSTRUMENTS) IMPLANT
MATRIX SURGICAL PSM 5X5CM (Tissue) ×3 IMPLANT
NEEDLE HYPO 25X1 1.5 SAFETY (NEEDLE) IMPLANT
NS IRRIG 1000ML POUR BTL (IV SOLUTION) ×3 IMPLANT
PACK BASIN DAY SURGERY FS (CUSTOM PROCEDURE TRAY) ×3 IMPLANT
PADDING CAST ABS 3INX4YD NS (CAST SUPPLIES)
PADDING CAST ABS 4INX4YD NS (CAST SUPPLIES)
PADDING CAST ABS COTTON 3X4 (CAST SUPPLIES) IMPLANT
PADDING CAST ABS COTTON 4X4 ST (CAST SUPPLIES) IMPLANT
PENCIL BUTTON HOLSTER BLD 10FT (ELECTRODE) ×3 IMPLANT
SHEET MEDIUM DRAPE 40X70 STRL (DRAPES) ×3 IMPLANT
SLEEVE SCD COMPRESS KNEE MED (MISCELLANEOUS) IMPLANT
SPLINT FIBERGLASS 3X35 (CAST SUPPLIES) ×3 IMPLANT
SPLINT FIBERGLASS 4X30 (CAST SUPPLIES) IMPLANT
SPLINT PLASTER CAST XFAST 3X15 (CAST SUPPLIES) IMPLANT
SPLINT PLASTER XTRA FASTSET 3X (CAST SUPPLIES)
SPONGE GAUZE 4X4 12PLY STER LF (GAUZE/BANDAGES/DRESSINGS) IMPLANT
SPONGE LAP 18X18 X RAY DECT (DISPOSABLE) ×3 IMPLANT
STAPLER VISISTAT 35W (STAPLE) IMPLANT
STOCKINETTE IMPERVIOUS LG (DRAPES) IMPLANT
STRIP CLOSURE SKIN 1/2X4 (GAUZE/BANDAGES/DRESSINGS) IMPLANT
SURGILUBE 2OZ TUBE FLIPTOP (MISCELLANEOUS) ×3 IMPLANT
SUT SILK 3 0 PS 1 (SUTURE) IMPLANT
SUT VIC AB 5-0 PS2 18 (SUTURE) ×3 IMPLANT
SYR BULB IRRIGATION 50ML (SYRINGE) ×3 IMPLANT
SYR CONTROL 10ML LL (SYRINGE) IMPLANT
TAPE HYPAFIX 6X30 (GAUZE/BANDAGES/DRESSINGS) IMPLANT
TOWEL OR 17X24 6PK STRL BLUE (TOWEL DISPOSABLE) ×3 IMPLANT
TRAY DSU PREP LF (CUSTOM PROCEDURE TRAY) ×3 IMPLANT
TUBE CONNECTING 20X1/4 (TUBING) ×3 IMPLANT
UNDERPAD 30X30 INCONTINENT (UNDERPADS AND DIAPERS) ×3 IMPLANT
YANKAUER SUCT BULB TIP NO VENT (SUCTIONS) ×3 IMPLANT

## 2014-09-10 NOTE — Op Note (Signed)
Operative Note   DATE OF OPERATION: 09/10/2014  LOCATION: [place]  SURGICAL DIVISION: Plastic Surgery  PREOPERATIVE DIAGNOSES:  Left posterior ankle ulcers (5 x 3 cm)  POSTOPERATIVE DIAGNOSES:  same  PROCEDURE:  Preparation of left ankle ulcers for placement of Acell (5 x 5 cm sheet)   SURGEON: Theodoro Kos, DO  ASSISTANT: Shawn Rayburn, PA  ANESTHESIA:  General.   COMPLICATIONS: None.   INDICATIONS FOR PROCEDURE:  The patient, Tracey Rogers is a 69 y.o. female born on 13-Apr-1946, is here for treatment of left lower extremity ulcer. MRN: [id number]  CONSENT:  Informed consent was obtained directly from the patient. Risks, benefits and alternatives were fully discussed. Specific risks including but not limited to bleeding, infection, hematoma, seroma, scarring, pain, infection, contracture, asymmetry, wound healing problems, and need for further surgery were all discussed. The patient did have an ample opportunity to have questions answered to satisfaction.   DESCRIPTION OF PROCEDURE:  The patient was taken to the operating room. SCDs were placed and IV antibiotics were given. The patient's operative site was prepped and draped in a sterile fashion. A time out was performed and all information was confirmed to be correct.  General anesthesia was administered.  The skin and ulcer was irrigated with antibiotic solution (5 x 3 cm).  The Acell sheet (5 x 3 cm) was applied and secured with 5-0 Vicryl.  The adaptic was placed and 4 x 4 gauze with surgical lube.  A kerlex and ace wrap were placed.   The patient tolerated the procedure well.  There were no complications. The patient was allowed to wake from anesthesia, extubated and taken to the recovery room in satisfactory condition.

## 2014-09-10 NOTE — Brief Op Note (Signed)
09/10/2014  2:39 PM  PATIENT:  Tracey Rogers  69 y.o. female  PRE-OPERATIVE DIAGNOSIS:  LEFT ANKLE WOUND  POST-OPERATIVE DIAGNOSIS:  LEFT ANKLE WOUND  PROCEDURE:  Procedure(s): IRRIGATION AND DEBRIDEMENT LEFT ANKLE WOUND SURGICAL PREP  (Left) APPLICATION OF A-CELL OF EXTREMITY (Left)  SURGEON:  Surgeon(s) and Role:    * Claire Sanger, DO - Primary  PHYSICIAN ASSISTANT: Shawn Rayburn, PA  ASSISTANTS: none   ANESTHESIA:   general  EBL:  Total I/O In: 1022 [P.O.:222; I.V.:800] Out: -   BLOOD ADMINISTERED:none  DRAINS: none   LOCAL MEDICATIONS USED:  NONE  SPECIMEN:  No Specimen  DISPOSITION OF SPECIMEN:  N/A  COUNTS:  YES  TOURNIQUET:  * No tourniquets in log *  DICTATION: .Dragon Dictation  PLAN OF CARE: Discharge to home after PACU  PATIENT DISPOSITION:  PACU - hemodynamically stable.   Delay start of Pharmacological VTE agent (>24hrs) due to surgical blood loss or risk of bleeding: no

## 2014-09-10 NOTE — Brief Op Note (Signed)
09/10/2014  2:46 PM  PATIENT:  Tracey Rogers  69 y.o. female  PRE-OPERATIVE DIAGNOSIS:  LEFT ANKLE WOUND  POST-OPERATIVE DIAGNOSIS:  LEFT ANKLE WOUND  PROCEDURE:  Procedure(s): IRRIGATION AND DEBRIDEMENT LEFT ANKLE WOUND SURGICAL PREP  (Left) APPLICATION OF A-CELL OF EXTREMITY (Left)  SURGEON:  Surgeon(s) and Role:    * Claire Sanger, DO - Primary  PHYSICIAN ASSISTANT: Shawn Rayburn, PA  ASSISTANTS: none   ANESTHESIA:   general  EBL:  Total I/O In: 1022 [P.O.:222; I.V.:800] Out: -   BLOOD ADMINISTERED:none  DRAINS: none   LOCAL MEDICATIONS USED:  NONE  SPECIMEN:  No Specimen  DISPOSITION OF SPECIMEN:  N/A  COUNTS:  YES  TOURNIQUET:  * No tourniquets in log *  DICTATION: .Dragon Dictation  PLAN OF CARE: Discharge to home after PACU  PATIENT DISPOSITION:  PACU - hemodynamically stable.   Delay start of Pharmacological VTE agent (>24hrs) due to surgical blood loss or risk of bleeding: no

## 2014-09-10 NOTE — Transfer of Care (Signed)
Immediate Anesthesia Transfer of Care Note  Patient: Tracey Rogers  Procedure(s) Performed: Procedure(s): IRRIGATION AND DEBRIDEMENT LEFT ANKLE WOUND SURGICAL PREP  (Left) APPLICATION OF A-CELL OF EXTREMITY (Left)  Patient Location: PACU  Anesthesia Type:General  Level of Consciousness: awake, alert  and oriented  Airway & Oxygen Therapy: Patient Spontanous Breathing and Patient connected to face mask oxygen  Post-op Assessment: Report given to RN and Post -op Vital signs reviewed and stable  Post vital signs: Reviewed and stable  Last Vitals:  Filed Vitals:   09/10/14 0958  BP: 164/67  Pulse: 65  Temp: 36.8 C  Resp: 18    Complications: No apparent anesthesia complications

## 2014-09-10 NOTE — Anesthesia Preprocedure Evaluation (Signed)
Anesthesia Evaluation  Patient identified by MRN, date of birth, ID band Patient awake    Reviewed: Allergy & Precautions, H&P , NPO status , Patient's Chart, lab work & pertinent test results  Airway Mallampati: I  TM Distance: >3 FB Neck ROM: Full    Dental no notable dental hx. (+) Edentulous Upper, Edentulous Lower, Dental Advisory Given   Pulmonary COPDformer smoker,  breath sounds clear to auscultation  Pulmonary exam normal       Cardiovascular hypertension, Pt. on medications + Peripheral Vascular Disease Rhythm:Regular Rate:Normal     Neuro/Psych negative neurological ROS  negative psych ROS   GI/Hepatic negative GI ROS, Neg liver ROS,   Endo/Other  negative endocrine ROS  Renal/GU negative Renal ROS  negative genitourinary   Musculoskeletal  (+) Arthritis -, Osteoarthritis,    Abdominal   Peds  Hematology negative hematology ROS (+)   Anesthesia Other Findings   Reproductive/Obstetrics negative OB ROS                             Anesthesia Physical Anesthesia Plan  ASA: III  Anesthesia Plan: General   Post-op Pain Management:    Induction: Intravenous  Airway Management Planned: LMA  Additional Equipment:   Intra-op Plan:   Post-operative Plan: Extubation in OR  Informed Consent: I have reviewed the patients History and Physical, chart, labs and discussed the procedure including the risks, benefits and alternatives for the proposed anesthesia with the patient or authorized representative who has indicated his/her understanding and acceptance.   Dental advisory given  Plan Discussed with: CRNA  Anesthesia Plan Comments:         Anesthesia Quick Evaluation

## 2014-09-10 NOTE — Anesthesia Procedure Notes (Signed)
Procedure Name: LMA Insertion Date/Time: 09/10/2014 12:16 PM Performed by: Melynda Ripple D Pre-anesthesia Checklist: Patient identified, Emergency Drugs available, Suction available and Patient being monitored Patient Re-evaluated:Patient Re-evaluated prior to inductionOxygen Delivery Method: Circle System Utilized Preoxygenation: Pre-oxygenation with 100% oxygen Intubation Type: IV induction Ventilation: Mask ventilation without difficulty LMA: LMA inserted LMA Size: 3.0 Number of attempts: 1 Airway Equipment and Method: Bite block Placement Confirmation: positive ETCO2 Tube secured with: Tape Dental Injury: Teeth and Oropharynx as per pre-operative assessment

## 2014-09-10 NOTE — H&P (Signed)
Tracey Rogers is an 69 y.o. female.   Chief Complaint: left ankle wound HPI: the patient is a 69 yrs old female here for treatement of her left ankle wound.  She has undergone debridement in the past with Acell placement.  She is showing signs of healing but has stalled in the past several weeks.  She has not had an recent illnesses.  Past Medical History  Diagnosis Date  . Hypertension   . Ulcer of left ankle   . Peripheral vascular disease   . Constipation   . Arthritis   . Psoriasis   . Skin ulcer of right great toe   . Wears dentures   . Complication of anesthesia     gets weak after surgery    Past Surgical History  Procedure Laterality Date  . Cataract extraction w/ intraocular lens  implant, bilateral  2015  . Endovascular stent insertion  sept  &  oct  2015    LEFT LEG STENTING--  common and superficial femoral artery  . Cardiovascular stress test  03-03-2014  dr Saralyn Pilar    normal lexi scan sestamibi study/  normal LVF without evidence for significant scar or ischemia  . Transthoracic echocardiogram  03-03-2014    normal LVF/  ef 55-60%/  mild TI and MI  . Incision and drainage of wound Bilateral 04/13/2014    Procedure: IRRIGATION AND DEBRIDEMENT OF LEFT ANKLE WOUND AND RIGHT FOOT;  Surgeon: Theodoro Kos, DO;  Location: [facility];  Service: Plastics;  Laterality: Bilateral;  . Application of a-cell of extremity Left 04/13/2014    Procedure: PLACEMENT OF APPLICATION OF A-CELL ;  Surgeon: Theodoro Kos, DO;  Location: [facility];  Service: Plastics;  Laterality: Left;    History reviewed. No pertinent family history. Social History:  reports that she quit smoking about 8 months ago. Her smoking use included Cigarettes. She has a 21 pack-year smoking history. She has never used smokeless tobacco. She reports that she does not drink alcohol or use illicit drugs.  Allergies:  Allergies  Allergen Reactions  . Minocin [Minocycline  Hcl] Rash  . Tetracyclines & Related Rash    Medications Prior to Admission  Medication Sig Dispense Refill  . alendronate (FOSAMAX) 70 MG tablet Take 70 mg by mouth once a week. Take with a full glass of water on an empty stomach.--  TAKES ON SUNDAY'S    . amLODipine (NORVASC) 10 MG tablet Take 10 mg by mouth every evening.     Marland Kitchen aspirin EC 81 MG tablet Take 81 mg by mouth daily.    . clopidogrel (PLAVIX) 75 MG tablet Take 75 mg by mouth daily.    Marland Kitchen docusate sodium (COLACE) 100 MG capsule Take 100 mg by mouth daily.    Marland Kitchen econazole nitrate 1 % cream Apply topically 2 (two) times daily. Apply to affected area twice daily. 60 g 4  . loratadine (CLARITIN) 10 MG tablet Take 10 mg by mouth daily.    . meloxicam (MOBIC) 15 MG tablet Take 15 mg by mouth every evening.     . Naftifine HCl (NAFTIN) 2 % CREA Apply 1 application topically 1 day or 1 dose. 60 g 2  . oxyCODONE-acetaminophen (PERCOCET/ROXICET) 5-325 MG per tablet Take 1 tablet by mouth every 4 (four) hours as needed for severe pain.    Marland Kitchen triamcinolone cream (KENALOG) 0.1 % Apply 1 application topically 2 (two) times daily.      Results for orders placed or  performed during the hospital encounter of 09/10/14 (from the past 48 hour(s))  I-STAT, chem 8     Status: None   Collection Time: 09/10/14 10:48 AM  Result Value Ref Range   Sodium 139 135 - 145 mmol/L   Potassium 4.1 3.5 - 5.1 mmol/L   Chloride 101 96 - 112 mmol/L   BUN 11 6 - 23 mg/dL   Creatinine, Ser 0.60 0.50 - 1.10 mg/dL   Glucose, Bld 92 70 - 99 mg/dL   Calcium, Ion 1.26 1.13 - 1.30 mmol/L   TCO2 22 0 - 100 mmol/L   Hemoglobin 12.9 12.0 - 15.0 g/dL   HCT 38.0 36.0 - 46.0 %   No results found.  Review of Systems  Constitutional: Negative.   HENT: Negative.   Eyes: Negative.   Respiratory: Negative.   Cardiovascular: Negative.   Gastrointestinal: Negative.   Genitourinary: Negative.   Musculoskeletal: Negative.   Skin: Negative.   Neurological: Negative.    Psychiatric/Behavioral: Negative.     Blood pressure 164/67, pulse 65, temperature 98.2 F (36.8 C), temperature source Oral, resp. rate 18, height 5\' 4"  (1.626 m), weight 51.71 kg (114 lb), SpO2 100 %. Physical Exam  Constitutional: She is oriented to person, place, and time. She appears well-developed and well-nourished.  HENT:  Head: Normocephalic and atraumatic.  Eyes: Conjunctivae are normal. Pupils are equal, round, and reactive to light.  Cardiovascular: Normal rate.   Respiratory: Effort normal.  Neurological: She is alert and oriented to person, place, and time.  Psychiatric: She has a normal mood and affect. Her behavior is normal. Thought content normal.     Assessment/Plan Plan for debridement with acell placement on left ankle.  [place] 09/10/2014, 11:45 AM

## 2014-09-10 NOTE — Anesthesia Postprocedure Evaluation (Signed)
  Anesthesia Post-op Note  Patient: Tracey Rogers  Procedure(s) Performed: Procedure(s): IRRIGATION AND DEBRIDEMENT LEFT ANKLE WOUND SURGICAL PREP  (Left) APPLICATION OF A-CELL OF EXTREMITY (Left)  Patient Location: PACU  Anesthesia Type:General  Level of Consciousness: awake and alert   Airway and Oxygen Therapy: Patient Spontanous Breathing  Post-op Pain: none  Post-op Assessment: Post-op Vital signs reviewed, Patient's Cardiovascular Status Stable and Respiratory Function Stable  Post-op Vital Signs: Reviewed  Filed Vitals:   09/10/14 1315  BP: 162/62  Pulse: 68  Temp:   Resp: 18    Complications: No apparent anesthesia complications

## 2014-09-10 NOTE — Discharge Instructions (Signed)
°  Post Anesthesia Home Care Instructions  Activity: Get plenty of rest for the remainder of the day. A responsible adult should stay with you for 24 hours following the procedure.  For the next 24 hours, DO NOT: -Drive a car -Paediatric nurse -Drink alcoholic beverages -Take any medication unless instructed by your physician -Make any legal decisions or sign important papers.  Meals: Start with liquid foods such as gelatin or soup. Progress to regular foods as tolerated. Avoid greasy, spicy, heavy foods. If nausea and/or vomiting occur, drink only clear liquids until the nausea and/or vomiting subsides. Call your physician if vomiting continues.  Special Instructions/Symptoms: Your throat may feel dry or sore from the anesthesia or the breathing tube placed in your throat during surgery. If this causes discomfort, gargle with warm salt water. The discomfort should disappear within 24 hours.  If you had a scopolamine patch placed behind your ear for the management of post- operative nausea and/or vomiting:  1. The medication in the patch is effective for 72 hours, after which it should be removed.  Wrap patch in a tissue and discard in the trash. Wash hands thoroughly with soap and water. 2. You may remove the patch earlier than 72 hours if you experience unpleasant side effects which may include dry mouth, dizziness or visual disturbances. 3. Avoid touching the patch. Wash your hands with soap and water after contact with the patch.   KY gel daily

## 2014-09-11 ENCOUNTER — Other Ambulatory Visit: Payer: Self-pay | Admitting: Family Medicine

## 2014-09-11 DIAGNOSIS — Z139 Encounter for screening, unspecified: Secondary | ICD-10-CM

## 2014-09-14 ENCOUNTER — Encounter (HOSPITAL_BASED_OUTPATIENT_CLINIC_OR_DEPARTMENT_OTHER): Payer: Self-pay | Admitting: Plastic Surgery

## 2014-09-14 DIAGNOSIS — B351 Tinea unguium: Secondary | ICD-10-CM | POA: Diagnosis not present

## 2014-09-14 DIAGNOSIS — M79674 Pain in right toe(s): Secondary | ICD-10-CM | POA: Diagnosis not present

## 2014-09-14 DIAGNOSIS — L97321 Non-pressure chronic ulcer of left ankle limited to breakdown of skin: Secondary | ICD-10-CM | POA: Diagnosis not present

## 2014-09-14 DIAGNOSIS — M79675 Pain in left toe(s): Secondary | ICD-10-CM | POA: Diagnosis not present

## 2014-09-14 DIAGNOSIS — M869 Osteomyelitis, unspecified: Secondary | ICD-10-CM | POA: Diagnosis not present

## 2014-09-18 DIAGNOSIS — L97321 Non-pressure chronic ulcer of left ankle limited to breakdown of skin: Secondary | ICD-10-CM | POA: Diagnosis not present

## 2014-09-18 DIAGNOSIS — Z9181 History of falling: Secondary | ICD-10-CM | POA: Diagnosis not present

## 2014-09-18 DIAGNOSIS — D649 Anemia, unspecified: Secondary | ICD-10-CM | POA: Diagnosis not present

## 2014-09-18 DIAGNOSIS — I872 Venous insufficiency (chronic) (peripheral): Secondary | ICD-10-CM | POA: Diagnosis not present

## 2014-09-18 DIAGNOSIS — I739 Peripheral vascular disease, unspecified: Secondary | ICD-10-CM | POA: Diagnosis not present

## 2014-09-18 DIAGNOSIS — I10 Essential (primary) hypertension: Secondary | ICD-10-CM | POA: Diagnosis not present

## 2014-09-19 NOTE — Op Note (Signed)
PATIENT NAME:  Tracey Rogers, Tracey Rogers MR#:  [id number] DATE OF BIRTH:  Nov 26, 1945  DATE OF PROCEDURE:  05/12/2014  PREOPERATIVE DIAGNOSIS: Atherosclerotic occlusive disease, bilateral lower extremities, with gangrene bilateral lower extremities.   POSTOPERATIVE DIAGNOSIS: Atherosclerotic occlusive disease, bilateral lower extremities, with gangrene bilateral lower extremities.   PROCEDURES PERFORMED:  1. Angiography of the right lower extremity, third order catheter placement.  2. Antegrade puncture right lower extremity with ultrasound guidance.  3. Crosser atherectomy, right superficial femoral artery.  4. Percutaneous transluminal angioplasty and stent placement, right superficial femoral artery and popliteal artery, to maximal diameter of 6 mm.  5. Percutaneous transluminal angioplasty of the right peroneal artery to 3 mm.   SURGEON: Katha Cabal, M.D.   SEDATION: Versed plus fentanyl. Continuous ECG, pulse oximetry and cardiopulmonary monitoring were performed throughout the entire procedure by the interventional radiology nurse. Total sedation time was 1 hour 50 minutes.   ACCESS: A 6 French sheath, antegrade direction, right common femoral artery.   FLUOROSCOPY TIME: 21.4 minutes.   CONTRAST USED: Isovue 110 mL.   INDICATIONS: Ms. Podgorski is a 69 year old woman with multiple medical problems and severe atherosclerotic occlusive disease, who has developed gangrene bilaterally. She has undergone partial treatment with iliac stenting. This had not allowed for significant improvement in her perfusion, and her rest pain continues. She is therefore returning for further intervention of the right lower extremity. The risks and benefits were reviewed. All questions were answered. The patient has agreed to proceed.   DESCRIPTION OF PROCEDURE: The patient is taken to special procedures and placed in the supine position. After adequate sedation is achieved, her right groin is prepped and  draped in a sterile fashion. Lidocaine 1% is infiltrated in the soft tissues. Ultrasound is placed in a sterile sleeve. Ultrasound is utilized to identify the common femoral artery, bifurcation is noted, and subsequently a site in the midportion of the common femoral artery is selected. A micropuncture needle is then used to access the common femoral artery under direct ultrasound visualization. Image is recorded for the permanent record and microwire is advanced into the profunda. A micro-sheath is then placed, followed by the J-wire and a 5 French sheath. With the J-wire in the profunda, a floppy Glidewire is then negotiated into the SFA. The J-wire is then removed and the dilator returned, and the 5 French sheath is negotiated into the SFA. Heparin 4000 units is given. Through the sheath, which is in the proximal SFA, hand injection of contrast demonstrates occlusion of the SFA in its proximal and mid portions. Identifiable reconstitution is not noted on these injections.   An additional 1000 units of heparin is given and a Crosser S6 device is prepped on the table. A straight Usher catheter and the Crosser catheter are then introduced after the sheath has been upsized to a 6 [place] sheath. The Crosser catheter is then used to negotiate the occlusion. Re-entry is achieved with a floppy Glidewire and a Kumpe catheter, and the catheter is advanced down to the distal popliteal and then into the peroneal vessels. Distal runoff is obtained with the catheter in these positions. Peroneal positioning of the catheter represents third order catheter placement.   A 0.035 Magic torque wire is then introduced down into the peroneal through the Kumpe catheter, and the popliteal SFA is addressed. A total of 4 Lutonix balloons, two 4 x 150 balloons, and then two 4 x 100 balloons, are used to serially dilate the popliteal and SFA from the level  of the femoral condyles to the level of the common femoral. Inflations are for 3  minutes to 12 to 14 atmospheres. Follow-up imaging demonstrates persistent, greater than 75% stenoses, as well as a flow-limiting dissection in the proximal half of the SFA. The dissection is at the origin and the 2 flow-limiting stenoses are further down. All 3 problems will be adequately covered with a 200 mm stent, and, therefore, a 6 x 200 stent is advanced across these and positioned with its leading edge just below the bifurcation. It is deployed without difficulty with excellent result and perfect positioning. It is postdilated with a 520 balloon; however, in the midportion where the 2 residual stenoses were, the stent is still under-dilated, and a 6 x 8 balloon is advanced across this area, and postdilation is performed with a 6 x 8 to 10 atmospheres. Post stent balloon inflations are for 1 minute.   Follow-up imaging through the sheath now demonstrates that there is rapid flow of contrast through the SFA and popliteal. The wire itself is occlusive at the origin of the peroneal, and in the proximal portion of the peroneal, the anterior tibial is patent, and it does have diffuse disease. Therefore, a 3 mm x 8 cm balloon is advanced over the wire and used to angioplasty the proximal peroneal, as well as the tibioperoneal trunk. Inflation is to 14 atmospheres for 1 minute 30 seconds. Follow-up imaging demonstrates that the peroneal is now widely patent with rapid flow. In fact, the flow in the peroneal is now faster than the anterior tibial.   Oblique view of the groin is obtained and a Mynx device is deployed. There are no immediate complications.   INTERPRETATION: Initial views demonstrate the common femoral is patent. There is diffuse disease. The profunda femoris is patent. There is diffuse disease, particularly, more distally, and it does not collateralize with the SFA and popliteal very well, at all, distally. The SFA is occluded throughout the majority of its course, and the popliteal is diffusely  diseased. The anterior tibial is patent, but there is diffuse disease. The peroneal demonstrates a string sign in its proximal portion. Posterior tibial is diffusely diseased, as well.   Following Crosser atherectomy, there is successful re-entry, and the catheter was advanced down into the tibial for distal imaging. Following angioplasty and stent placement, there is now wide patency of the SFA and popliteal. Following angioplasty of the peroneal and tibioperoneal trunk, there is now wide patency of the peroneal.   SUMMARY: Successful revascularization of the right lower extremity as described above.    ____________________________ Katha Cabal, MD ggs:JT D: 05/12/2014 13:01:02 ET T: 05/12/2014 13:59:40 ET JOB#: [id number]  cc: Katha Cabal, MD, <Dictator> Jerrell Belfast, MD Lew Dawes. Genevive Bi, [place] MD ELECTRONICALLY SIGNED 05/12/2014 16:08

## 2014-09-19 NOTE — Op Note (Signed)
PATIENT NAME:  Tracey Rogers, Tracey Rogers MR#:  [id number] DATE OF BIRTH:  04-03-1946  DATE OF PROCEDURE:  02/06/2014  PREOPERATIVE DIAGNOSES: Atherosclerotic occlusive disease, bilateral lower extremities, with ulceration and rest pain of the left lower extremity.   POSTOPERATIVE DIAGNOSES:  1.  Atherosclerotic occlusive disease, bilateral lower extremities, with ulceration and rest pain of the left lower extremity.  2.  Complication of vascular device.   PROCEDURES: 1.  Introduction catheter into aorta, right femoral approach.  2.  Introduction catheter into aorta, left femoral approach.  3.  Percutaneous transluminal angioplasty and stent placement, right common iliac.   SURGEON: Hortencia Pilar, MD  SEDATION: Versed 7 mg plus fentanyl 300 mcg administered IV. Continuous ECG, pulse oximetry and cardiopulmonary monitoring is performed throughout the entire procedure by the interventional radiology nurse. Total sedation time was 2 hours.   ACCESSES:  1.  A 6-French sheath, right common femoral artery.  2.  A 4-French sheath, left common femoral artery, both in retrograde direction.   CONTRAST USED: Isovue 66 mL.   FLUOROSCOPY TIME: 14.3 minutes.   INDICATIONS: Tracey Rogers is a 69 year old woman who is returning for continued pain and nonhealing of her left foot. Several weeks ago she underwent angioplasty and stent placement of bilateral iliac arteries. At that time superficial femoral artery and common femoral artery disease was noted. The risks and benefits for angiography with the hope for intervention is reviewed. All questions answered. The patient and family have agreed to proceed.   DESCRIPTION OF PROCEDURE: The patient is taken to the special procedure suite, placed in the supine position. After adequate sedation has been achieved, both groins are prepped and draped in sterile fashion. Ultrasound is placed in a sterile sleeve. Common femoral artery is identified. It is echolucent and  pulsatile indicating patency. Appropriate timeout is called.   Under direct visualization, after recording an image, 1% lidocaine is infiltrated in the soft tissues, and a micropuncture needle is inserted into the anterior wall of the common femoral artery, on the right, microwire followed by microsheath, J-wire followed by a 5-French sheath. Several attempts are made at crossing the aortic bifurcation and advancing a catheter down into the left lower extremity. A variety of catheters including a VCF, C2, rim catheters and a variety of different wires including VersaCore glide wires were tried. Unfortunately, given the pre-existing stents and the steep angulation of the neo-bifurcation, this was not successful. At one point it was noted that the sheath had wrinkled the inferior margin of the stent creating a complication secondary to this vascular device and therefore under fluoroscopic guidance the 5-French sheath was upsized to a 6-French sheath and an Omnilink 7 x 29 stent balloon expandable was deployed without difficulty across the distal aspect of the common iliac stent. This resulted in an excellent result with good apposition of the stent and complete ironing out of the wrinkle within the previously placed stent. Given this difficulty, it was decided to access the left groin. Review of previous images demonstrated there is a relatively high grade, greater than 70% stenosis, of the common femoral in its distal portion and therefore ultrasound was utilized. The common femoral was identified. A high-grade stenosis of the plaque was noted and then scanned more proximally to a more appropriate area of the common femoral. Image was recorded for the permanent record. Then 1% lidocaine was infiltrated and under direct visualization a micropuncture needle is inserted, microwire followed by microsheath, J-wire followed by a 4-French sheath. The wire is  then advanced and catheter is advanced, and the entire left  lower extremity is imaged. The proximal margins of both stents appear to be well opposed to each other in the midline of the aortic wall. More distally the iliac stents on the left appear well opposed.   Via hand injection, the entire left lower extremity distal runoff is then obtained. After review of the images, both sheaths are pulled and manual pressure is held. There is no immediate complication.   INTERPRETATION: The abdominal aorta demonstrates diffuse disease, however, there are no hemodynamically significant stenoses. Pre-existing kissing stents are noted and they remain patent in their proximal portions. The external iliac stent on the left is patent as well.   Secondary to manipulations with the sheath, the right distal end of the common iliac stent is wrinkled and this is easily treated with a balloon expandable Omnilink stent with an excellent result resolving the complication of the vascular device.   Left lower extremity demonstrates approximately 60 to 75% stenosis of the common femoral. Profunda femoris is patent, but diffusely diseased. There is diffuse disease with a greater than 90% string sign in the proximal one-third of the SFA. It remains somewhat undersized throughout but patent throughout its course. At [facility] canal, it appears to take a more normal size and then extends down to a below-knee popliteal, which is clearly normal in size filling a patent trifurcation. There is moderate disease in the distal tibioperoneal trunk. Posterior tibial and anterior tibial are widely patent and appear quite normal down to the foot. Peroneal is patent but quite small.   SUMMARY: The patient has significant common femoral disease and given her ulcerations this will require surgical clean out or endarterectomy. With respect to her SFA disease, there appears to be 2 options; intraoperative intervention for treatment of the SFA is certainly a possibility and/or femoral to popliteal bypass.  However, given the size of her artery overall, which is quite small, a vein would certainly be a much better conduit than prosthetic in her case and therefore I will vein map her. If she does not have adequate vein, then intraoperative intervention of the SFA may be the more suitable way to treat her distal disease after the femoral endarterectomy.  ____________________________ Katha Cabal, MD ggs:sb D: 02/06/2014 10:08:43 ET T: 02/06/2014 10:26:37 ET JOB#: [id number]  cc: Katha Cabal, MD, <Dictator> Katha Cabal MD ELECTRONICALLY SIGNED 03/16/2014 20:40

## 2014-09-19 NOTE — Consult Note (Signed)
Brief Consult Note: Patient was seen by consultant.   Consult note dictated.   Comments: 43 f with PAD, chronic LE ulcers, hypertension, right subclavian artery stenosis, past tobacco abuse here for Left deep femoral and commom femoral endarterectomy PTA left SFA PTA left Posterior Tibial. Seems to be doing well in PACU. Has A line. Will be monitored in CCU later. Continue home meds. Will follow pt along with you.Marland Kitchen  Electronic Signatures: Alba Destine (MD)  (Signed 30-Oct-15 12:16)  Authored: Brief Consult Note   Last Updated: 30-Oct-15 12:16 by Alba Destine (MD)

## 2014-09-19 NOTE — Op Note (Signed)
PATIENT NAME:  Tracey Rogers, Tracey Rogers MR#:  [id number] DATE OF BIRTH:  1945/10/14  DATE OF PROCEDURE:  03/27/2014  PREOPERATIVE DIAGNOSIS: Atherosclerotic occlusive disease, bilateral lower extremities, with bilateral pedal ulcers.  POSTOPERATIVE DIAGNOSIS: Atherosclerotic occlusive disease, bilateral lower extremities, with bilateral pedal ulcers.  PROCEDURES PERFORMED: 1.  Left deep femoral and common femoral endarterectomy with a CorMatrix patch angioplasty.  2.  Repair of arterial defect using xenograft CorMatrix patch.  3.  Open angioplasty of the superficial femoral artery using Lutonix balloons to 4 mm.  4.  Open angioplasty of the left posterior tibial artery to 3 mm.   SURGEON: Katha Cabal, MD   ANESTHESIA: General by LMA.   FLUIDS: Per anesthesia record.   ESTIMATED BLOOD LOSS: 100 mL.   SPECIMEN: Femoral plaque to pathology for permanent section.   INDICATIONS: Ms. Pressly is a 69 year old woman who presents with ulcerations of her left foot at the heel. She has known atherosclerotic occlusive disease and has failed attempts at intervention secondary to common femoral disease and therefore she is not a candidate, but also unusually small arterial size. The risks and benefits for open femoral endarterectomy and subsequent angioplasty were reviewed. All questions answered. The patient agrees to proceed.   DESCRIPTION OF PROCEDURE: The patient is taken to the operating room and placed in the supine position. After adequate general anesthesia is induced and appropriate invasive monitors are placed, she is positioned supine and her left thigh and groin area are prepped and draped in a sterile fashion. Appropriate timeout is called.   Vertical incision is made in the left groin and carried down through the soft tissues. Femoral sheath is encountered. It is then opened and the common femoral artery is exposed. Dissection is then carried proximally to the level of the circumflex  vessels and circumferential dissection around the distal external iliac is performed. Dissection is then carried for several centimeters down the SFA, which is looped with a Silastic vessel loop. The profunda femoris is then exposed and looped around the second-order branches with Silastic vessel loops.   The patient is then given 4000 of heparin which is allowed to circulate for approximately 5 minutes.  An 11 blade is then used to open the common femoral artery and Potts scissors extend the arteriotomy down onto the second-order branches of the profunda. Endarterectomy is then performed under direct visualization for the common femoral and profunda femoris. Interrupted 7-0 Prolene sutures are then used to tack the distal intimal edge within the profunda. Both of the 2 origins of the branches are tacked with multiple sutures. The origin of the SFA is also tacked with multiple sutures. Proximally there appears to be an adequate intimal edge obtained.   CorMatrix patch is then rehydrated on the back table, beveled, and then applied to repair the arterial defect using running 6-0 Prolene.   Prior to beginning the endarterectomy, the origin of the SFA is identified and a Magic Torque wire is advanced under direct visualization down into the SFA. The wire is then left extending through the medial side of the patch repair up to the point where a 6-French sheath can be placed. Interrupted 6-0 Prolene sutures are used to secure the suture line above and below the sheath, creating a hemostatic situation with the sheath now extending several centimeters into the SFA.   C-arm is then delivered into the field and used to image the left leg from the groin down to the distal calf. Hand injection contrast of the  sheath is performed. Subsequently, a 4 x 15 Lutonix balloon is advanced down to the above-knee popliteal. Inflation is to 8 atmospheres for 3 minutes. A second 4 x 15 is advanced and then a 4 x 8 balloon  covering the entire length of the SFA. Followup imaging demonstrates a little haziness in the proximal portion, but there is wide patency now of the SFA with multiple lesions resolved. There appears to be stenosis and/or subtotal occlusion of the tibioperoneal trunk and posterior tibial, and the wire is negotiated into the posterior tibial, confirmed with angiography, and then a 3 x 10 balloon is advanced into the posterior tibial extending up into the tibioperoneal trunk. Inflation is to 10 atmospheres for 2 minutes. Followup imaging demonstrates improvement with patency of the tibioperoneal trunk and posterior tibial as well as the anterior tibial. Peroneal does not appear to have flow.   After imaging the entire SFA, the sheath and wire are removed, the suture line is completed, and flow is re-established down the SFA without obstruction. It should be noted that once the sheath was positioned, the clamps were removed and flow was continued through the profunda femoris during the open intervention.   The wound is then irrigated, Surgicel is placed in a layer around the suture line, and subsequently the femoral incision is closed in 5 layers with two 2-0 layers of Vicryl, three 3-0 layers, and then 4-0 Monocryl for skin. Dermabond is applied. The patient tolerated the procedure well. There were no immediate complications. Sponge and needle counts are correct, and she is taken to the recovery room in stable condition.   ____________________________ Katha Cabal, MD ggs:ST D: 03/27/2014 17:50:15 ET T: 03/27/2014 23:26:27 ET JOB#: [id number]  cc: Katha Cabal, MD, <Dictator> Timothy E. Genevive Bi, MD Jerrell Belfast, MD Katha Cabal MD ELECTRONICALLY SIGNED 04/01/2014 11:46

## 2014-09-19 NOTE — Op Note (Signed)
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   PROCEDURES PERFORMED:  1. Abdominal aortogram.  2. Left lower extremity distal runoff.  3. Percutaneous transluminal angioplasty and stent placement, right common iliac artery.  4. Percutaneous transluminal angioplasty and stent placement, left common iliac artery.  5. Percutaneous transluminal angioplasty and stent placement, left external iliac artery.    PROCEDURE PERFORMED BY:  Dr. Hortencia Pilar.  SEDATION: Versed 7 mg plus fentanyl 300 mcg administered IV.   Continuous ECG, pulse oximetry and cardiopulmonary monitoring is performed throughout the entire procedure by the interventional radiology nurse. Total sedation time is 1 hour and 30 minutes.   ACCESS:  1. A 6 French sheath, right common femoral artery.  2. A 6 French sheath, left common femoral artery.   CONTRAST USED: Isovue 95 mL.   FLUOROSCOPY TIME: 9.1 minutes.   INDICATIONS: Ms. Bevill is a 69 year old woman who presented to the office with severe atherosclerotic occlusive disease associated with ulcerations and marked ischemic changes of her left foot. Risks and benefits for angiography and intervention are reviewed. All questions are answered. The patient has agreed to proceed.   DESCRIPTION OF PROCEDURE: The patient is taken to special procedures and placed in the supine position. After adequate sedation is achieved, both groins are prepped and draped in sterile fashion. Appropriate timeout is called.   Ultrasound is placed in a sterile sleeve. Ultrasound is utilized secondary to lack of appropriate landmarks and to avoid vascular injury.  Under real-time visualization, common femoral artery on the right is identified. It is echolucent and pulsatile indicating patency. Image is recorded for the permanent record.   Under ultrasound visualization, a micropuncture needle is inserted into the common femoral artery anteriorly, microwire followed by micro sheath, J-wire followed by a 5 French sheath and 5 French pigtail catheter.   The pigtail catheter is positioned at the level of T12 and AP projection of the aorta is obtained.   Pigtail catheter is repositioned to above the bifurcation and bilateral oblique views of the pelvis are obtained.   The detector is then repositioned in the AP and the femoral and  mid thigh imaging are obtained bilaterally. At this point because of anatomical considerations, distal runoff is completed on the left side only.   Heparin 4000 units is given. Magic Torque wire is introduced through the right-sided catheter and the catheter is removed and a 6 [place] sheath is exchanged for the 5 [place] sheath.   Ultrasound is then utilized to image the left common femoral and the artery is identified. It is echolucent indicating patency. Images recorded, and under real-time visualization, access is obtained in the AP with a micropuncture needle, microwire followed by micro sheath, a J-wire followed by a 6 French sheath. Using a KMP catheter and the Magic Torque wire, the wire catheter is negotiated through the strictures and stenoses of the left iliac system.   Magnified images are then obtained of the aortic bifurcation, common iliacs and hand injected contrast is utilized to demonstrate the arteries. Subsequently, a 7 x  40 LifeStar stent is advanced up the right and a 7 x 60 LifeStar stent is advanced up the left side. These are positioned so that the stents raised  the aortic bifurcation by about 0.5 cm. They are deployed without difficulty and post dilated with 6 mm balloons. On the left, the lesion is not completely  covered and therefore a 7 x 30 LifeStar stent is deployed and postdilated with a 6 mm balloon. This completely treats the common iliac lesion on the left; however, the atherosclerotic changes extend well into the external and a 6 x 40 LifeStar stent is deployed and then postdilated with a 5 mm balloon. Imaging through a pigtail catheter advanced up the right wire demonstrates that there is now excellent treatment of the iliac lesions both right common, left common and left external. Imaging is now performed of the groins in oblique fashion in preparation for closure device. The femoral arteries are noted to be less than 4 mm bilaterally and essentially the sheath is near occlusive on both sides, and therefore ACT is obtained. It is 2:30 and the sheaths are then pulled and manual pressure is held after 30 minutes. There are no immediate complications.   INTERPRETATION: Initial views of the abdominal aorta demonstrates diffuse disease. There is high-grade, bilateral renal artery stenosis. There are no hemodynamically significant lesions within the infrarenal aorta. There is a moderate to severe greater than 65% stenosis on the right and the common iliac on the left there are subtotal occlusions with diffuse disease extending throughout the entire common iliac and extending into the proximal one third of the external iliac. The right external iliac is patent.   Both common femorals are patent, but they are quite small and there is moderate amount of plaque posteriorly noted. The SFA occludes on the right in its midportion. On the left, there is patency of the SFA, but there is diffuse disease throughout with several areas of subtotal occlusion. The popliteal is reconstituted and there appears to be 3-vessel runoff proximally.   After placement of the stents and postdilatation, there is now excellent treatment of the iliac lesions with significant improvement and flow bilaterally.   SUMMARY: Successful  revascularization of the aorta iliac system as described above. There is high probability, given the patient's tissue loss on the left, that she will require further angiography, which will likely be done through an antegrade stick. This was discussed with the family in the terms that her arteries are so small that even a closure device is contraindicated.    ____________________________ Katha Cabal, MD ggs:jh D: 12/25/2013 18:04:42 ET T: 12/25/2013 18:18:41 ET JOB#: [id number]  cc: Katha Cabal, MD, <Dictator> Katha Cabal MD ELECTRONICALLY SIGNED 01/06/2014 17:16

## 2014-09-19 NOTE — Consult Note (Signed)
PATIENT NAME:  Tracey Rogers, ZORN MR#:  [id number] DATE OF BIRTH:  1945/06/12  DATE OF CONSULTATION:  03/27/2014  REFERRING PHYSICIAN:  Katha Cabal, MD CONSULTING PHYSICIAN:  Hilda Rynders R. Alleen Kehm, MD  REASON FOR CONSULT: Hypertension and perioperative management.   HISTORY OF PRESENTING ILLNESS: A 69 year old female patient with history of peripheral arterial disease with prior stents into the lower extremity is here today for a left lower extremity femoral-popliteal endarterectomy. The patient has had surgery, presently in PACU. Does have chronic lower extremity pain, which is unchanged. She also has significant claudication pains and lower extremity ulcers for which she has had multiple procedures.   She does not complain of any shortness of breath, nausea, vomiting, abdominal pain. No recent change in medications. Had evaluation with [facility] and was cleared for surgery with a normal echocardiogram.   Presently her vitals seem stable with pulse of 74, blood pressure 130/84, saturating 96% on 1 liter oxygen.   PAST MEDICAL HISTORY:  1. Peripheral arterial disease.  2. Chronic lower extremity ulcers.  3. Right subclavian artery stenosis.  4. Hypertension.  5. Past tobacco abuse.  6. Hysterectomy.  7. Asthma.   FAMILY HISTORY: Hypertension.   SOCIAL HISTORY: The patient does not smoke. No alcohol. Ambulates with a cane.  CODE STATUS: Full code.  ALLERGIES: ERYTHROMYCIN, TETRACYCLINES.   REVIEW OF SYSTEMS:  CONSTITUTIONAL: No fever or fatigue.  EYES: No blurred vision, pain, redness.  EARS, NOSE, AND THROAT: No tinnitus, ear pain, hearing loss. RESPIRATORY: No cough, wheeze, hemoptysis.  CARDIOVASCULAR: No chest pain, orthopnea, edema.  GASTROINTESTINAL: No nausea, vomiting, diarrhea, abdominal pain.  GENITOURINARY: No dysuria, hematuria, frequency. ENDOCRINE: No polyuria, nocturia, thyroid problems. HEMATOLOGIC AND LYMPHATIC: No anemia, easy bruising or bleeding.   INTEGUMENTARY: No acne, rash, lesion.  MUSCULOSKELETAL: No back pain, arthritis.  NEUROLOGICAL: No focal numbness, weakness, seizure. Has some peripheral neuropathy.  PSYCHIATRIC: No anxiety or depression.  HOME MEDICATIONS: 1.  oxycodone 325/5 mg 1 tablet every 6 hours as needed for pain.  2. Amiloride 70 mg daily.  3. Amlodipine 10 mg daily.  4. Aspirin 81 mg daily.  5. Docusate sodium 100 mg 2 times a day as needed for constipation.  7. Meloxicam 15 mg oral once a day.  8. Multivitamin 1 tablet daily.  9. Plavix 75 mg daily.  10. Triamcinolone topical 2 times a day as needed for itching.   PHYSICAL EXAMINATION:  VITAL SIGNS: Pulse of 74, blood pressure 130/83, saturating 96% on 1 L oxygen.  GENERAL: Moderately built Caucasian female patient lying in bed, seems comfortable, conversational, cooperative with exam. PSYCHIATRIC: Alert and oriented x 3. Mood and affect appropriate. Judgment intact.  HEENT: Atraumatic, normocephalic. Oral mucosa moist and pink. External ears and nose normal. No pallor or icterus. Pupils are equal and reactive to light.  NECK: Supple. No thyromegaly. No palpable lymph nodes. Trachea midline. No carotid bruit or JVD.  CARDIOVASCULAR: S1, S2 without any murmurs. Peripheral pulses decreased. No edema.  RESPIRATORY: Normal work of breathing. Clear to auscultation in both sides.  GASTROINTESTINAL: Soft abdomen, nontender. Bowel sounds present. No organomegaly palpable.  SKIN: Warm and dry. No petechiae, rash.  MUSCULOSKELETAL: No joint swelling, redness, effusion of the large joints. Normal muscle tone.  NEUROLOGICAL: Motor strength 5/5 in upper and lower extremities. LYMPHATIC: No cervical lymphadenopathy.   LABORATORY INVESTIGATIONS AND STUDIES: Done recently on 03/18/2014 have been reviewed with normal BUN, creatinine, hemoglobin of 10.7.   Chest x-ray done on 03/18/2014 showed a stable  examination with 2 mid-thoracic compression deformities but unchanged.    ASSESSMENT AND PLAN: 1. Left femoral and common femoral endarterectomy management per Dr. Delana Meyer. At this point, she has an A line and her blood pressure is being closely monitored in CCU. 2. Hypertension. Continue home medications. Use IV p.r.n. medications.  3. Anemia of chronic disease needs to be monitored.  4. Pain controlled with pain medications. 5. Code status: Full code.   Time spent today on this consult was 40 minutes.    ____________________________ Leia Alf Kamarri Fischetti, MD srs:jh D: 03/27/2014 12:38:58 ET T: 03/27/2014 13:35:22 ET JOB#: [id number]  cc: Alveta Heimlich R. Darvin Neighbours, MD, <Dictator> Jerrell Belfast, MD Katha Cabal, MD  Neita Carp MD ELECTRONICALLY SIGNED 04/02/2014 15:01

## 2014-09-21 DIAGNOSIS — I739 Peripheral vascular disease, unspecified: Secondary | ICD-10-CM | POA: Diagnosis not present

## 2014-09-21 DIAGNOSIS — L97321 Non-pressure chronic ulcer of left ankle limited to breakdown of skin: Secondary | ICD-10-CM | POA: Diagnosis not present

## 2014-09-21 DIAGNOSIS — L97329 Non-pressure chronic ulcer of left ankle with unspecified severity: Secondary | ICD-10-CM | POA: Diagnosis not present

## 2014-09-21 DIAGNOSIS — I87312 Chronic venous hypertension (idiopathic) with ulcer of left lower extremity: Secondary | ICD-10-CM | POA: Diagnosis not present

## 2014-09-21 DIAGNOSIS — I872 Venous insufficiency (chronic) (peripheral): Secondary | ICD-10-CM | POA: Diagnosis not present

## 2014-09-21 LAB — SURGICAL PATHOLOGY

## 2014-09-22 DIAGNOSIS — Z9189 Other specified personal risk factors, not elsewhere classified: Secondary | ICD-10-CM | POA: Insufficient documentation

## 2014-09-22 DIAGNOSIS — M810 Age-related osteoporosis without current pathological fracture: Secondary | ICD-10-CM | POA: Insufficient documentation

## 2014-09-22 DIAGNOSIS — K5909 Other constipation: Secondary | ICD-10-CM | POA: Insufficient documentation

## 2014-09-22 DIAGNOSIS — F329 Major depressive disorder, single episode, unspecified: Secondary | ICD-10-CM | POA: Insufficient documentation

## 2014-09-22 DIAGNOSIS — E871 Hypo-osmolality and hyponatremia: Secondary | ICD-10-CM | POA: Insufficient documentation

## 2014-09-22 DIAGNOSIS — Z87898 Personal history of other specified conditions: Secondary | ICD-10-CM | POA: Insufficient documentation

## 2014-09-22 DIAGNOSIS — M1990 Unspecified osteoarthritis, unspecified site: Secondary | ICD-10-CM | POA: Insufficient documentation

## 2014-09-22 DIAGNOSIS — L409 Psoriasis, unspecified: Secondary | ICD-10-CM | POA: Insufficient documentation

## 2014-09-22 DIAGNOSIS — J309 Allergic rhinitis, unspecified: Secondary | ICD-10-CM | POA: Insufficient documentation

## 2014-09-23 NOTE — Op Note (Signed)
PATIENT NAME:  Tracey Rogers, Tracey Rogers MR#:  [id number] DATE OF BIRTH:  01/07/1946  DATE OF PROCEDURE:  05/27/2014  PREOPERATIVE DIAGNOSES:  1.  Atherosclerotic occlusive disease, bilateral lower extremities, with ulceration bilateral lower extremities.  2.  Ischemic changes, left great toe.   PROCEDURES PERFORMED:  1.  Left lower extremity angiography, third order catheter placement.  2.  Crosser atherectomy, left superficial femoral artery.  3.  Percutaneous transluminal angioplasty and stent placement, left superficial femoral artery using Lutonix balloons and a LifeStent.   SURGEON: Hortencia Pilar, MD   SEDATION: Versed plus fentanyl. Continuous ECG, pulse oximetry and cardiopulmonary monitoring is performed throughout the entire procedure by the interventional radiology nurse. Total sedation time was 1 hour, 30 minutes.   ACCESS: A 6 French sheath, right common femoral artery.   FLUOROSCOPY TIME: 14.3 minutes.   CONTRAST USED: Isovue 75 mL.   INDICATIONS: Tracey Rogers is a 69 year old woman who has undergone multiple vascular procedures in an attempt to salvage her legs. She presented with advanced changes and ischemic ulcerations bilaterally. She has required a combination of both percutaneous interventions as well as open surgeries. Recently, she was noted to have worsening changes of the left great toe. She has known SFA occlusion and it is elected to attempt to recanalize the SFA in an attempt to salvage her left leg. The risks and benefits are reviewed. All questions are answered. The patient has agreed to proceed.   DESCRIPTION OF PROCEDURE: The patient is taken to special procedures and placed in the supine position. After adequate sedation is achieved, both groins are prepped and draped in a sterile fashion. Appropriate timeout is called.   Ultrasound is placed in a sterile sleeve. The ultrasound is utilized secondary to lack of appropriate landmarks and to avoid vascular injury.  Under direct visualization, the common femoral artery is identified. It is echolucent and pulsatile with a fairly significant burden of plaque noted posteriorly in the midportion of the femoral artery. Therefore, the ultrasound is used to scan more proximally to a place that is relatively spared and this is the site for access under direct visualization. Image is recorded for the permanent record and 1% lidocaine had been infiltrated. Micro needle followed by a microwire, micro sheath, J-wire followed by a 5 [place] sheath. VS1 catheter and a floppy Glidewire are then used to cross the aortic bifurcation and the wire is negotiated down into the profunda. The VS1 is removed and a Kumpe catheter is advanced up and over the bifurcation. The Glidewire is then removed and an Amplatz wire is advanced under fluoroscopic visualization. A 6 French high flex Ansell sheath is then advanced up and over the bifurcation position with its tip in the common femoral. LAO projection of the groin is then obtained, and about a 1 cm stump of the SFA is identified. Previous endarterectomy extending into the profunda femoris is widely patent with extensive profunda femoris branches filling. The wire and Kumpe catheter used to negotiate into the SFA stump. Subsequently, the  Kumpe catheter is removed and a straight microcatheter is inserted. Heparin 4000 units is given. A 14S crosser device is then prepped on the table and advanced through the microcatheter. It then tracks down across the stenotic area and into the more mid SFA. Initial injection demonstrated what appeared to be a dissection; however on pulling back and reinjection the 14S is clearly intraluminal at this position. The microcatheter is then advanced down to this level and formal angiography demonstrates intraluminal placement  within the SFA. Versacore wire is then advanced down into the tibial vessels.   A 4 x 120 Lutonix balloon is then used to angioplasty the occluded  segment of the SFA. Inflation is to 14 atmospheres for 3 minutes. Follow up imaging demonstrates that the SFA is now patent; however, there is a very large flow-limiting dissection noted through the majority of this angioplasty site. However, this does not allow for a more formal distal runoff, which demonstrates moderate to severe disease to other locations in the SFA, popliteal is relatively spared and the trifurcation is patent with 3-vessel runoff to the foot. There is diffuse tibial vessel disease noted, but overall relatively good tibial flow.   It is therefore elected to angioplasty the two other areas within the SFA. Again, a 4 x 150 Lutonix balloon is then advanced and positioned with its distal marker at [facility] canal extending more proximally. Inflation is to 12 atmospheres for 3 minutes. Follow up imaging demonstrates no evidence of dissection with excellent luminal gain.   At this point, only the initial area is still a problem and therefore a 5 x 120 LifeStent is deployed across this area, beginning just a few millimeters from the origin of the SFA. It is then postdilated with a 5 mm balloon. There is one area that is post dilated again with a 5 x 40 [place] balloon to 20 atmospheres. Follow up imaging now demonstrates rapid flow of contrast through the SFA. No evidence of dissection. There is mild residual stenosis but no greater than 20% at any one location. There is preservation of the popliteal and tibial vessel runoff.   The sheath is then pulled into the right external iliac. Oblique view obtained and then subsequently a Mynx device deployed successfully. There are no immediate complications.   INTERPRETATION: Initial views of the right lower extremity demonstrate the right common femoral and profunda femoris endarterectomy is widely patent without evidence of any defect or abnormality. There is extensive filling of the profunda branches with collateralization to below the knee. SFA is  occluded beginning approximately 10 mm from its origin through approximately an 8 to 10 cm segment, ultimately two further areas, one just above [place] canal and one in the midportion are noted to be stenotic as well. The popliteal, trifurcation tibial vessels are relatively spared as described above.   Following crosser atherectomy, there is successful entrance with proper luminal positioning. Following angioplasty of the SFA using the 2 Lutonix balloons as described above, there is flow-limiting dissection proximally, distally there is an excellent result. Subsequently, a single 120 mm LifeStent is deployed and postdilated to 5 mm with an excellent result.   SUMMARY: Successful salvage or recanalization of the left lower extremity as described above.    ____________________________ Katha Cabal, MD ggs:TT D: 05/27/2014 17:06:29 ET T: 05/27/2014 17:28:06 ET JOB#: [id number]  cc: Katha Cabal, MD, <Dictator> Gerrit Heck. Troxler, DPM Timothy E. Genevive Bi, MD Jerrell Belfast, MD Katha Cabal MD ELECTRONICALLY SIGNED 06/09/2014 17:33

## 2014-09-25 DIAGNOSIS — L97321 Non-pressure chronic ulcer of left ankle limited to breakdown of skin: Secondary | ICD-10-CM | POA: Diagnosis not present

## 2014-09-25 DIAGNOSIS — I10 Essential (primary) hypertension: Secondary | ICD-10-CM | POA: Diagnosis not present

## 2014-09-25 DIAGNOSIS — I739 Peripheral vascular disease, unspecified: Secondary | ICD-10-CM | POA: Diagnosis not present

## 2014-09-25 DIAGNOSIS — Z9181 History of falling: Secondary | ICD-10-CM | POA: Diagnosis not present

## 2014-09-25 DIAGNOSIS — D649 Anemia, unspecified: Secondary | ICD-10-CM | POA: Diagnosis not present

## 2014-09-25 DIAGNOSIS — I872 Venous insufficiency (chronic) (peripheral): Secondary | ICD-10-CM | POA: Diagnosis not present

## 2014-09-27 NOTE — Op Note (Signed)
PATIENT NAME:  Tracey Rogers, Tracey Rogers MR#:  [id number] DATE OF BIRTH:  21-Sep-1945  DATE OF PROCEDURE:  06/05/2014  PREOPERATIVE DIAGNOSES: 1. 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  2. 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   POSTOPERATIVE DIAGNOSIS:  1. 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  2. 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   PROCEDURES:  Include:  1. Resection of the metatarsal head and base of the proximal phalanx, right foot.  2. Excision of gangrenous ulcer, 7 cm x 3 cm.   SURGEON: Gerrit Heck. Shimon Trowbridge, DPM  ASSISTANT: None.   HISTORY OF PRESENT ILLNESS: The patient had developed an ulceration on the medial 1st metatarsal head, which eventually became gangrenous. She underwent vascular reconstruction, which did help stabilize it. Unfortunately, the metatarsal head has been exposed for a while and likely had some superficial osteomyelitis associated with that region. Due to the tissue loss, it was unlikely that tissue could heal across the bone, and I felt like the joint needed to be resected on both sides in order to get closure to the wound.   ANESTHESIA: Local with MAC.   ESTIMATED BLOOD LOSS: Negligible.   HEMOSTASIS: Less than 15 mL.   OPERATIVE REPORT: The patient was brought to the OR and placed on the OR table in the supine position. At this point, after sedation was achieved by the anesthesia team, I blocked the 1st metatarsal at the base of the 1st metatarsal. The area was then prepped and draped in the usual sterile manner. At this time, attention was directed to the medial aspect of the right foot, where the ulcer was identified medially on the 1st metatarsal head. An elliptical incision was made around this area that was 7 cm in length and 3 mm in width. The entire gangrenous portion of the ulceration was then removed and sent to pathology for  evaluation. At this point, the capsular tissue was noted to be eroded over the 1st metatarsal and the proximal phalanx base. Other capsular tissue in the region was freed up from bone attachments, and the metatarsal head and the base of the proximal phalanx were resected with power equipment. Once this was accomplished, the bone was inspected and appeared to be clean. The area was copiously irrigated, Versajet was utilized to remove any potential devitalized areas or infected areas from the wound site. Overall, the wound was fairly clean. After copious irrigation, the capsular tissue was then closed with 3-0 Vicryl simple interrupted sutures. The skin was then closed with 3-0 nylon, a combination of horizontal mattress and simple interrupted sutures. A sterile compressive dressing was placed across the wound, consisting of Xeroform gauze, 4 x 4's, Conform, and Kerlix, and an Ace wrap. The patient is to stay off of this, keep it elevated as much as possible. Pain medicines were prescribed to her yesterday in the office. She is to return in 5 days for follow-up in my office. She tolerated the procedure and anesthesia well and left the OR for the recovery room with vital signs stable and neurovascular status intact.   ____________________________ Gerrit Heck. Decklyn Hornik, DPM mgt:mw D: 06/05/2014 12:36:23 ET T: 06/05/2014 13:20:44 ET JOB#: [id number]  cc: Gerrit Heck Judine Arciniega, DPM, <Dictator> Perry Mount MD ELECTRONICALLY SIGNED 06/11/2014 12:59

## 2014-09-28 DIAGNOSIS — I6529 Occlusion and stenosis of unspecified carotid artery: Secondary | ICD-10-CM | POA: Diagnosis not present

## 2014-09-28 DIAGNOSIS — I10 Essential (primary) hypertension: Secondary | ICD-10-CM | POA: Diagnosis not present

## 2014-09-28 DIAGNOSIS — I739 Peripheral vascular disease, unspecified: Secondary | ICD-10-CM | POA: Diagnosis not present

## 2014-09-28 DIAGNOSIS — L97509 Non-pressure chronic ulcer of other part of unspecified foot with unspecified severity: Secondary | ICD-10-CM | POA: Diagnosis not present

## 2014-09-28 DIAGNOSIS — L97309 Non-pressure chronic ulcer of unspecified ankle with unspecified severity: Secondary | ICD-10-CM | POA: Diagnosis not present

## 2014-09-28 DIAGNOSIS — I998 Other disorder of circulatory system: Secondary | ICD-10-CM | POA: Diagnosis not present

## 2014-09-28 DIAGNOSIS — E785 Hyperlipidemia, unspecified: Secondary | ICD-10-CM | POA: Diagnosis not present

## 2014-09-28 DIAGNOSIS — I70299 Other atherosclerosis of native arteries of extremities, unspecified extremity: Secondary | ICD-10-CM | POA: Diagnosis not present

## 2014-09-29 DIAGNOSIS — I10 Essential (primary) hypertension: Secondary | ICD-10-CM | POA: Diagnosis not present

## 2014-09-29 DIAGNOSIS — I739 Peripheral vascular disease, unspecified: Secondary | ICD-10-CM | POA: Diagnosis not present

## 2014-09-29 DIAGNOSIS — I872 Venous insufficiency (chronic) (peripheral): Secondary | ICD-10-CM | POA: Diagnosis not present

## 2014-09-29 DIAGNOSIS — L97321 Non-pressure chronic ulcer of left ankle limited to breakdown of skin: Secondary | ICD-10-CM | POA: Diagnosis not present

## 2014-09-29 DIAGNOSIS — D649 Anemia, unspecified: Secondary | ICD-10-CM | POA: Diagnosis not present

## 2014-09-29 DIAGNOSIS — Z9181 History of falling: Secondary | ICD-10-CM | POA: Diagnosis not present

## 2014-10-01 DIAGNOSIS — Z9181 History of falling: Secondary | ICD-10-CM | POA: Diagnosis not present

## 2014-10-01 DIAGNOSIS — I872 Venous insufficiency (chronic) (peripheral): Secondary | ICD-10-CM | POA: Diagnosis not present

## 2014-10-01 DIAGNOSIS — L97321 Non-pressure chronic ulcer of left ankle limited to breakdown of skin: Secondary | ICD-10-CM | POA: Diagnosis not present

## 2014-10-01 DIAGNOSIS — D649 Anemia, unspecified: Secondary | ICD-10-CM | POA: Diagnosis not present

## 2014-10-01 DIAGNOSIS — I739 Peripheral vascular disease, unspecified: Secondary | ICD-10-CM | POA: Diagnosis not present

## 2014-10-01 DIAGNOSIS — I10 Essential (primary) hypertension: Secondary | ICD-10-CM | POA: Diagnosis not present

## 2014-10-02 DIAGNOSIS — I872 Venous insufficiency (chronic) (peripheral): Secondary | ICD-10-CM | POA: Diagnosis not present

## 2014-10-02 DIAGNOSIS — L97321 Non-pressure chronic ulcer of left ankle limited to breakdown of skin: Secondary | ICD-10-CM | POA: Diagnosis not present

## 2014-10-02 DIAGNOSIS — I10 Essential (primary) hypertension: Secondary | ICD-10-CM | POA: Diagnosis not present

## 2014-10-02 DIAGNOSIS — D649 Anemia, unspecified: Secondary | ICD-10-CM | POA: Diagnosis not present

## 2014-10-02 DIAGNOSIS — I739 Peripheral vascular disease, unspecified: Secondary | ICD-10-CM | POA: Diagnosis not present

## 2014-10-02 DIAGNOSIS — Z9181 History of falling: Secondary | ICD-10-CM | POA: Diagnosis not present

## 2014-10-05 ENCOUNTER — Encounter (HOSPITAL_BASED_OUTPATIENT_CLINIC_OR_DEPARTMENT_OTHER): Payer: Commercial Managed Care - HMO | Attending: Plastic Surgery

## 2014-10-05 DIAGNOSIS — I739 Peripheral vascular disease, unspecified: Secondary | ICD-10-CM | POA: Diagnosis not present

## 2014-10-05 DIAGNOSIS — L97321 Non-pressure chronic ulcer of left ankle limited to breakdown of skin: Secondary | ICD-10-CM | POA: Insufficient documentation

## 2014-10-05 DIAGNOSIS — I87312 Chronic venous hypertension (idiopathic) with ulcer of left lower extremity: Secondary | ICD-10-CM | POA: Insufficient documentation

## 2014-10-06 DIAGNOSIS — I10 Essential (primary) hypertension: Secondary | ICD-10-CM | POA: Diagnosis not present

## 2014-10-06 DIAGNOSIS — Z9181 History of falling: Secondary | ICD-10-CM | POA: Diagnosis not present

## 2014-10-06 DIAGNOSIS — I872 Venous insufficiency (chronic) (peripheral): Secondary | ICD-10-CM | POA: Diagnosis not present

## 2014-10-06 DIAGNOSIS — L97321 Non-pressure chronic ulcer of left ankle limited to breakdown of skin: Secondary | ICD-10-CM | POA: Diagnosis not present

## 2014-10-06 DIAGNOSIS — I739 Peripheral vascular disease, unspecified: Secondary | ICD-10-CM | POA: Diagnosis not present

## 2014-10-06 DIAGNOSIS — D649 Anemia, unspecified: Secondary | ICD-10-CM | POA: Diagnosis not present

## 2014-10-08 DIAGNOSIS — I739 Peripheral vascular disease, unspecified: Secondary | ICD-10-CM | POA: Diagnosis not present

## 2014-10-08 DIAGNOSIS — I872 Venous insufficiency (chronic) (peripheral): Secondary | ICD-10-CM | POA: Diagnosis not present

## 2014-10-08 DIAGNOSIS — I10 Essential (primary) hypertension: Secondary | ICD-10-CM | POA: Diagnosis not present

## 2014-10-08 DIAGNOSIS — Z9181 History of falling: Secondary | ICD-10-CM | POA: Diagnosis not present

## 2014-10-08 DIAGNOSIS — L97321 Non-pressure chronic ulcer of left ankle limited to breakdown of skin: Secondary | ICD-10-CM | POA: Diagnosis not present

## 2014-10-08 DIAGNOSIS — D649 Anemia, unspecified: Secondary | ICD-10-CM | POA: Diagnosis not present

## 2014-10-09 DIAGNOSIS — I10 Essential (primary) hypertension: Secondary | ICD-10-CM | POA: Diagnosis not present

## 2014-10-09 DIAGNOSIS — I739 Peripheral vascular disease, unspecified: Secondary | ICD-10-CM | POA: Diagnosis not present

## 2014-10-09 DIAGNOSIS — I872 Venous insufficiency (chronic) (peripheral): Secondary | ICD-10-CM | POA: Diagnosis not present

## 2014-10-09 DIAGNOSIS — Z9181 History of falling: Secondary | ICD-10-CM | POA: Diagnosis not present

## 2014-10-09 DIAGNOSIS — L97321 Non-pressure chronic ulcer of left ankle limited to breakdown of skin: Secondary | ICD-10-CM | POA: Diagnosis not present

## 2014-10-09 DIAGNOSIS — D649 Anemia, unspecified: Secondary | ICD-10-CM | POA: Diagnosis not present

## 2014-10-12 ENCOUNTER — Other Ambulatory Visit: Payer: Self-pay | Admitting: Family Medicine

## 2014-10-12 ENCOUNTER — Ambulatory Visit
Admission: RE | Admit: 2014-10-12 | Discharge: 2014-10-12 | Disposition: A | Discharge status: Home / Self Care | Payer: Commercial Managed Care - HMO | Source: Ambulatory Visit | Attending: Family Medicine | Admitting: Family Medicine

## 2014-10-12 DIAGNOSIS — Z139 Encounter for screening, unspecified: Secondary | ICD-10-CM

## 2014-10-12 DIAGNOSIS — Z1231 Encounter for screening mammogram for malignant neoplasm of breast: Secondary | ICD-10-CM | POA: Diagnosis not present

## 2014-10-16 DIAGNOSIS — I87312 Chronic venous hypertension (idiopathic) with ulcer of left lower extremity: Secondary | ICD-10-CM | POA: Diagnosis not present

## 2014-10-16 DIAGNOSIS — L97329 Non-pressure chronic ulcer of left ankle with unspecified severity: Secondary | ICD-10-CM | POA: Diagnosis not present

## 2014-10-16 DIAGNOSIS — I739 Peripheral vascular disease, unspecified: Secondary | ICD-10-CM | POA: Diagnosis not present

## 2014-10-16 DIAGNOSIS — I10 Essential (primary) hypertension: Secondary | ICD-10-CM | POA: Diagnosis not present

## 2014-10-16 DIAGNOSIS — I872 Venous insufficiency (chronic) (peripheral): Secondary | ICD-10-CM | POA: Diagnosis not present

## 2014-10-16 DIAGNOSIS — D649 Anemia, unspecified: Secondary | ICD-10-CM | POA: Diagnosis not present

## 2014-10-16 DIAGNOSIS — Z9181 History of falling: Secondary | ICD-10-CM | POA: Diagnosis not present

## 2014-10-16 DIAGNOSIS — L97321 Non-pressure chronic ulcer of left ankle limited to breakdown of skin: Secondary | ICD-10-CM | POA: Diagnosis not present

## 2014-10-28 DIAGNOSIS — Z961 Presence of intraocular lens: Secondary | ICD-10-CM | POA: Diagnosis not present

## 2014-11-02 ENCOUNTER — Encounter (HOSPITAL_BASED_OUTPATIENT_CLINIC_OR_DEPARTMENT_OTHER): Payer: Commercial Managed Care - HMO | Attending: Plastic Surgery

## 2014-11-02 DIAGNOSIS — I739 Peripheral vascular disease, unspecified: Secondary | ICD-10-CM | POA: Insufficient documentation

## 2014-11-02 DIAGNOSIS — L97821 Non-pressure chronic ulcer of other part of left lower leg limited to breakdown of skin: Secondary | ICD-10-CM | POA: Insufficient documentation

## 2014-11-02 DIAGNOSIS — I872 Venous insufficiency (chronic) (peripheral): Secondary | ICD-10-CM | POA: Diagnosis not present

## 2014-11-11 ENCOUNTER — Encounter: Payer: Self-pay | Admitting: Family Medicine

## 2015-01-01 ENCOUNTER — Other Ambulatory Visit: Payer: Self-pay | Admitting: Family Medicine

## 2015-01-01 DIAGNOSIS — I10 Essential (primary) hypertension: Secondary | ICD-10-CM

## 2015-01-11 ENCOUNTER — Other Ambulatory Visit: Payer: Self-pay | Admitting: Vascular Surgery

## 2015-01-11 DIAGNOSIS — I70299 Other atherosclerosis of native arteries of extremities, unspecified extremity: Secondary | ICD-10-CM | POA: Diagnosis not present

## 2015-01-11 DIAGNOSIS — E785 Hyperlipidemia, unspecified: Secondary | ICD-10-CM | POA: Diagnosis not present

## 2015-01-11 DIAGNOSIS — L97509 Non-pressure chronic ulcer of other part of unspecified foot with unspecified severity: Secondary | ICD-10-CM | POA: Diagnosis not present

## 2015-01-11 DIAGNOSIS — I70262 Atherosclerosis of native arteries of extremities with gangrene, left leg: Secondary | ICD-10-CM

## 2015-01-11 DIAGNOSIS — I739 Peripheral vascular disease, unspecified: Secondary | ICD-10-CM | POA: Diagnosis not present

## 2015-01-11 DIAGNOSIS — I96 Gangrene, not elsewhere classified: Secondary | ICD-10-CM

## 2015-01-11 DIAGNOSIS — I10 Essential (primary) hypertension: Secondary | ICD-10-CM | POA: Diagnosis not present

## 2015-01-11 DIAGNOSIS — I6529 Occlusion and stenosis of unspecified carotid artery: Secondary | ICD-10-CM | POA: Diagnosis not present

## 2015-01-11 DIAGNOSIS — L97309 Non-pressure chronic ulcer of unspecified ankle with unspecified severity: Secondary | ICD-10-CM | POA: Diagnosis not present

## 2015-01-11 DIAGNOSIS — I998 Other disorder of circulatory system: Secondary | ICD-10-CM | POA: Diagnosis not present

## 2015-01-20 ENCOUNTER — Ambulatory Visit: Payer: Commercial Managed Care - HMO

## 2015-01-26 ENCOUNTER — Other Ambulatory Visit: Payer: Self-pay | Admitting: Vascular Surgery

## 2015-01-27 ENCOUNTER — Ambulatory Visit
Admission: RE | Admit: 2015-01-27 | Discharge: 2015-01-27 | Disposition: A | Discharge status: Home / Self Care | Payer: Commercial Managed Care - HMO | Source: Ambulatory Visit | Attending: Vascular Surgery | Admitting: Vascular Surgery

## 2015-01-27 DIAGNOSIS — K829 Disease of gallbladder, unspecified: Secondary | ICD-10-CM | POA: Insufficient documentation

## 2015-01-27 DIAGNOSIS — I701 Atherosclerosis of renal artery: Secondary | ICD-10-CM | POA: Diagnosis not present

## 2015-01-27 DIAGNOSIS — I96 Gangrene, not elsewhere classified: Secondary | ICD-10-CM

## 2015-01-27 DIAGNOSIS — I70262 Atherosclerosis of native arteries of extremities with gangrene, left leg: Secondary | ICD-10-CM | POA: Diagnosis not present

## 2015-01-27 DIAGNOSIS — K828 Other specified diseases of gallbladder: Secondary | ICD-10-CM | POA: Diagnosis not present

## 2015-01-27 MED ORDER — IOHEXOL 350 MG/ML SOLN
125.0000 mL | Freq: Once | INTRAVENOUS | Status: AC | PRN
  Administered 2015-01-27: 125 mL via INTRAVENOUS

## 2015-01-28 DIAGNOSIS — I70299 Other atherosclerosis of native arteries of extremities, unspecified extremity: Secondary | ICD-10-CM | POA: Diagnosis not present

## 2015-01-28 DIAGNOSIS — I739 Peripheral vascular disease, unspecified: Secondary | ICD-10-CM | POA: Diagnosis not present

## 2015-01-28 DIAGNOSIS — L97309 Non-pressure chronic ulcer of unspecified ankle with unspecified severity: Secondary | ICD-10-CM | POA: Diagnosis not present

## 2015-01-28 DIAGNOSIS — I10 Essential (primary) hypertension: Secondary | ICD-10-CM | POA: Diagnosis not present

## 2015-01-28 DIAGNOSIS — I6529 Occlusion and stenosis of unspecified carotid artery: Secondary | ICD-10-CM | POA: Diagnosis not present

## 2015-01-28 DIAGNOSIS — E785 Hyperlipidemia, unspecified: Secondary | ICD-10-CM | POA: Diagnosis not present

## 2015-01-28 DIAGNOSIS — L97509 Non-pressure chronic ulcer of other part of unspecified foot with unspecified severity: Secondary | ICD-10-CM | POA: Diagnosis not present

## 2015-01-28 DIAGNOSIS — I998 Other disorder of circulatory system: Secondary | ICD-10-CM | POA: Diagnosis not present

## 2015-02-19 ENCOUNTER — Ambulatory Visit (INDEPENDENT_AMBULATORY_CARE_PROVIDER_SITE_OTHER): Payer: Commercial Managed Care - HMO | Admitting: Family Medicine

## 2015-02-19 ENCOUNTER — Encounter: Payer: Self-pay | Admitting: Family Medicine

## 2015-02-19 VITALS — BP 138/62 | HR 78 | Temp 97.4°F | Resp 16 | Ht 64.0 in | Wt 140.0 lb

## 2015-02-19 DIAGNOSIS — Z23 Encounter for immunization: Secondary | ICD-10-CM | POA: Diagnosis not present

## 2015-02-19 DIAGNOSIS — Z0000 Encounter for general adult medical examination without abnormal findings: Secondary | ICD-10-CM

## 2015-02-19 DIAGNOSIS — J309 Allergic rhinitis, unspecified: Secondary | ICD-10-CM | POA: Diagnosis not present

## 2015-02-19 DIAGNOSIS — K8020 Calculus of gallbladder without cholecystitis without obstruction: Secondary | ICD-10-CM | POA: Diagnosis not present

## 2015-02-19 MED ORDER — FLUTICASONE PROPIONATE 50 MCG/ACT NA SUSP: 2.0000 | Freq: Every day | NASAL | Status: AC

## 2015-02-19 NOTE — Progress Notes (Signed)
Patient ID: Tracey Rogers, female   DOB: [birth year], 69 y.o.   MRN: [id number] Visit Date: 02/19/2015  Today's Provider: Margarita Rana, MD   Chief Complaint  Patient presents with  . Annual Exam   Subjective:   Tracey Rogers is a 69 y.o. female who presents today for her Subsequent Annual Wellness Visit. She feels fairly well. She reports she is not exercising. She reports she is sleeping fairly well.  Mammogram- 09/23/14 BI-RADS 1  Review of Systems  Constitutional: Positive for unexpected weight change.  HENT: Positive for sneezing.   Cardiovascular: Positive for leg swelling.  Gastrointestinal:       Indigestion  Endocrine: Positive for cold intolerance.  Skin: Positive for rash and wound.  Hematological: Bruises/bleeds easily.    Patient Active Problem List   Diagnosis Date Noted  . 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 Allergic rhinitis 09/22/2014  . Chronic constipation 09/22/2014  . History of repeated overdose 09/22/2014  . Below normal amount of sodium in the blood 09/22/2014  . Depression, major, single episode 09/22/2014  . Arthritis, degenerative 09/22/2014  . OP (osteoporosis) 09/22/2014  . Psoriasis 09/22/2014  . Foot ulcer 04/10/2014  . Compulsive tobacco user syndrome 02/16/2014  . Ankle ulcer 02/16/2014  . CAFL (chronic airflow limitation) 02/16/2014  . Benign essential HTN 02/16/2014  . 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 Current tobacco use 02/16/2014  . Leg ulcer 02/10/2014    Social History   Social History  . Marital Status: Married    Spouse Name: N/A  . Number of Children: N/A  . Years of Education: N/A   Occupational History  . Not on file.   Social History Main Topics  . Smoking status: Former Smoker -- 0.50 packs/day for 42 years    Types: Cigarettes    Quit date: 12/24/2013  . Smokeless tobacco: Never Used  . Alcohol Use: No  . Drug Use: No  . Sexual Activity: Not on file   Other Topics Concern  . Not on file    Social History Narrative    Past Surgical History  Procedure Laterality Date  . Cataract extraction w/ intraocular lens  implant, bilateral  2015  . Endovascular stent insertion  sept  &  oct  2015    LEFT LEG STENTING--  common and superficial femoral artery  . Cardiovascular stress test  03-03-2014  dr Saralyn Pilar    normal lexi scan sestamibi study/  normal LVF without evidence for significant scar or ischemia  . Transthoracic echocardiogram  03-03-2014    normal LVF/  ef 55-60%/  mild TI and MI  . Incision and drainage of wound Bilateral 04/13/2014    Procedure: IRRIGATION AND DEBRIDEMENT OF LEFT ANKLE WOUND AND RIGHT FOOT;  Surgeon: Theodoro Kos, DO;  Location: [facility];  Service: Plastics;  Laterality: Bilateral;  . Application of a-cell of extremity Left 04/13/2014    Procedure: PLACEMENT OF APPLICATION OF A-CELL ;  Surgeon: Theodoro Kos, DO;  Location: [facility];  Service: Plastics;  Laterality: Left;  . I&d extremity Left 09/10/2014    Procedure: IRRIGATION AND DEBRIDEMENT LEFT ANKLE WOUND SURGICAL PREP ;  Surgeon: Theodoro Kos, DO;  Location: [facility];  Service: Plastics;  Laterality: Left;  . Application of a-cell of extremity Left 09/10/2014    Procedure: APPLICATION OF A-CELL OF EXTREMITY;  Surgeon: Theodoro Kos, DO;  Location: [facility];  Service: Plastics;  Laterality: Left;    Her family history includes Breast  cancer (age of onset: 21) in her maternal aunt; Heart disease in her mother and sister; Hypertension in her mother; Kidney disease in her mother; Stroke in her sister.    Outpatient Prescriptions Prior to Visit  Medication Sig Dispense Refill  . alendronate (FOSAMAX) 70 MG tablet Take 70 mg by mouth once a week. Take with a full glass of water on an empty stomach.--  TAKES ON SUNDAY'S    . amLODipine (NORVASC) 10 MG tablet TAKE ONE (1) TABLET EACH DAY 90 tablet 3  . aspirin EC 81 MG tablet Take  81 mg by mouth daily.    . clopidogrel (PLAVIX) 75 MG tablet Take 75 mg by mouth daily.    Tracey Rogers Kitchen econazole nitrate 1 % cream Apply topically 2 (two) times daily. Apply to affected area twice daily. 60 g 4  . loratadine (CLARITIN) 10 MG tablet Take 10 mg by mouth daily.    . meloxicam (MOBIC) 15 MG tablet Take 15 mg by mouth every evening.     . docusate sodium (COLACE) 100 MG capsule Take 100 mg by mouth daily.    . Naftifine HCl (NAFTIN) 2 % CREA Apply 1 application topically 1 day or 1 dose. (Patient not taking: Reported on 02/19/2015) 60 g 2  . oxyCODONE-acetaminophen (PERCOCET/ROXICET) 5-325 MG per tablet Take 1 tablet by mouth every 4 (four) hours as needed for severe pain.    Tracey Rogers Kitchen triamcinolone cream (KENALOG) 0.1 % Apply 1 application topically 2 (two) times daily.     No facility-administered medications prior to visit.    Allergies  Allergen Reactions  . Clarithromycin Rash    "Mycin"  . Methocarbamol Rash  . Minocin [Minocycline Hcl] Rash  . Tetracycline Rash  . Tetracyclines & Related Rash    Patient Care Team: Margarita Rana, MD as PCP - General (Family Medicine)  Objective:   Vitals:  Filed Vitals:   02/19/15 1419  BP: 138/62  Pulse: 78  Temp: 97.4 F (36.3 C)  TempSrc: Oral  Resp: 16  Height: 5\' 4"  (1.626 m)  Weight: 140 lb (63.504 kg)    Physical Exam  Constitutional: She is oriented to person, place, and time. She appears well-developed and well-nourished.  HENT:  Head: Normocephalic and atraumatic.  Right Ear: External ear normal.  Left Ear: External ear normal.  Nose: Nose normal.  Mouth/Throat: Oropharynx is clear and moist.  Eyes: Conjunctivae and EOM are normal. Pupils are equal, round, and reactive to light.  Neck: Normal range of motion. Neck supple.  Cardiovascular: Normal rate and regular rhythm.   murmur  Pulmonary/Chest: Effort normal and breath sounds normal.  Abdominal: Soft. Bowel sounds are normal.  Genitourinary:  Did not examine   Musculoskeletal: Normal range of motion.  Neurological: She is alert and oriented to person, place, and time. She has normal reflexes.  Skin: Skin is warm and dry.  Psychiatric: She has a normal mood and affect. Her behavior is normal. Judgment and thought content normal.    Activities of Daily Living In your present state of health, do you have any difficulty performing the following activities: 02/19/2015 09/10/2014  Hearing? Y N  Vision? N N  Difficulty concentrating or making decisions? Y N  Walking or climbing stairs? Y -  Dressing or bathing? N Y  Doing errands, shopping? Y -    Fall Risk Assessment Fall Risk  02/19/2015  Falls in the past year? Yes  Injury with Fall? Yes     Depression Screen PHQ 2/9 Scores  02/19/2015  PHQ - 2 Score 0    Cognitive Testing - 6-CIT    Year: 0 points  Month: 0 points  Memorize "Pia Mau, [address], [place]"  Time (within 1 hour:) 0 3 points  Count backwards from 20: 0 points  Name months of year: 2 points  Repeat Address: 6 points   Total Score: 8/28  Interpretation : Normal (0-7) Abnormal (8-28)    Assessment & Plan:     Annual Wellness Visit  Reviewed patient's Family Medical History Reviewed and updated list of patient's medical providers Assessment of cognitive impairment was done Assessed patient's functional ability Established a written schedule for health screening [place] Completed and Reviewed  Exercise Activities and Dietary recommendations Goals    None       There is no immunization history on file for this patient.  Health Maintenance  Topic Date Due  . Hepatitis C Screening  August 19, 1945  . TETANUS/TDAP  03/02/1965  . COLONOSCOPY  03/02/1996  . ZOSTAVAX  03/02/2006  . DEXA SCAN  03/03/2011  . PNA vac Low Risk Adult (1 of 2 - PCV13) 03/03/2011  . INFLUENZA VACCINE  12/28/2014  . MAMMOGRAM  10/11/2016      Discussed health benefits of physical activity, and encouraged  her to engage in regular exercise appropriate for her age and condition.  Use walker with ambulation.   1. Medicare annual wellness visit, subsequent As above.   2. Need for pneumococcal vaccination - Pneumococcal conjugate vaccine 13-valent  3. Allergic rhinitis, unspecified allergic rhinitis type Refill today.  - fluticasone (FLONASE) 50 MCG/ACT nasal spray; Place 2 sprays into both nostrils daily.  Dispense: 16 g; Refill: 2  4. 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  - Ambulatory referral to General Surgery      Patient was seen and examined by Jerrell Belfast, MD, and note scribed by Webb Laws, [place].  I have reviewed the document for accuracy and completeness and I agree with above. - Jerrell Belfast, MD    [facility] 02/19/2015 2:26 PM  ------------------------------------------------------------------------------------------------------------

## 2015-02-22 ENCOUNTER — Encounter: Payer: Self-pay | Admitting: *Deleted

## 2015-02-22 DIAGNOSIS — L851 Acquired keratosis [keratoderma] palmaris et plantaris: Secondary | ICD-10-CM | POA: Diagnosis not present

## 2015-02-22 DIAGNOSIS — E1142 Type 2 diabetes mellitus with diabetic polyneuropathy: Secondary | ICD-10-CM | POA: Diagnosis not present

## 2015-02-22 DIAGNOSIS — E1159 Type 2 diabetes mellitus with other circulatory complications: Secondary | ICD-10-CM | POA: Diagnosis not present

## 2015-03-04 ENCOUNTER — Ambulatory Visit (INDEPENDENT_AMBULATORY_CARE_PROVIDER_SITE_OTHER): Payer: Commercial Managed Care - HMO | Admitting: General Surgery

## 2015-03-04 ENCOUNTER — Encounter: Payer: Self-pay | Admitting: General Surgery

## 2015-03-04 VITALS — BP 132/68 | HR 78 | Resp 16 | Ht 64.0 in | Wt 140.0 lb

## 2015-03-04 DIAGNOSIS — K828 Other specified diseases of gallbladder: Secondary | ICD-10-CM

## 2015-03-04 DIAGNOSIS — K8020 Calculus of gallbladder without cholecystitis without obstruction: Secondary | ICD-10-CM

## 2015-03-04 NOTE — Patient Instructions (Addendum)
Laparoscopic Cholecystectomy Laparoscopic cholecystectomy is surgery to remove the gallbladder. The gallbladder is located in the upper right part of the abdomen, behind the liver. It is a storage sac for bile, which is produced in the liver. Bile aids in the digestion and absorption of fats. Cholecystectomy is often done for inflammation of the gallbladder (cholecystitis). This condition is usually caused by a buildup of gallstones (cholelithiasis) in the gallbladder. Gallstones can block the flow of bile, and that can result in inflammation and pain. In severe cases, emergency surgery may be required. If emergency surgery is not required, you will have time to prepare for the procedure. Laparoscopic surgery is an alternative to open surgery. Laparoscopic surgery has a shorter recovery time. Your common bile duct may also need to be examined during the procedure. If stones are found in the common bile duct, they may be removed. LET YOUR HEALTH CARE PROVIDER KNOW ABOUT:  Any allergies you have.  All medicines you are taking, including vitamins, herbs, eye drops, creams, and over-the-counter medicines.  Previous problems you or members of your family have had with the use of anesthetics.  Any blood disorders you have.  Previous surgeries you have had.  Any medical conditions you have. RISKS AND COMPLICATIONS Generally, this is a safe procedure. However, problems may occur, including:  Infection.  Bleeding.  Allergic reactions to medicines.  Damage to other structures or organs.  A stone remaining in the common bile duct.  A bile leak from the cyst duct that is clipped when your gallbladder is removed.  The need to convert to open surgery, which requires a larger incision in the abdomen. This may be necessary if your surgeon thinks that it is not safe to continue with a laparoscopic procedure. BEFORE THE PROCEDURE  Ask your health care provider about:  Changing or stopping your  regular medicines. This is especially important if you are taking diabetes medicines or blood thinners.  Taking medicines such as aspirin and ibuprofen. These medicines can thin your blood. Do not take these medicines before your procedure if your health care provider instructs you not to.  Follow instructions from your health care provider about eating or drinking restrictions.  Let your health care provider know if you develop a cold or an infection before surgery.  Plan to have someone take you home after the procedure.  Ask your health care provider how your surgical site will be marked or identified.  You may be given antibiotic medicine to help prevent infection. PROCEDURE  To reduce your risk of infection:  Your health care team will wash or sanitize their hands.  Your skin will be washed with soap.  An IV tube may be inserted into one of your veins.  You will be given a medicine to make you fall asleep (general anesthetic).  A breathing tube will be placed in your mouth.  The surgeon will make several small cuts (incisions) in your abdomen.  A thin, lighted tube (laparoscope) that has a tiny camera on the end will be inserted through one of the small incisions. The camera on the laparoscope will send a picture to a TV screen (monitor) in the operating room. This will give the surgeon a good view inside your abdomen.  A gas will be pumped into your abdomen. This will expand your abdomen to give the surgeon more room to perform the surgery.  Other tools that are needed for the procedure will be inserted through the other incisions. The gallbladder will   be removed through one of the incisions.  After your gallbladder has been removed, the incisions will be closed with stitches (sutures), staples, or skin glue.  Your incisions may be covered with a bandage (dressing). The procedure may vary among health care providers and hospitals. AFTER THE PROCEDURE  Your blood  pressure, heart rate, breathing rate, and blood oxygen level will be monitored often until the medicines you were given have worn off.  You will be given medicines as needed to control your pain.   This information is not intended to replace advice given to you by your health care provider. Make sure you discuss any questions you have with your health care provider.   Document Released: 05/15/2005 Document Revised: 02/03/2015 Document Reviewed: 12/25/2012 Elsevier Interactive Patient Education 2016 Elsevier Inc.  

## 2015-03-04 NOTE — Progress Notes (Signed)
Patient ID: CALLAN YONTZ, female   DOB: 1945-10-31, 69 y.o.   MRN: [id number]  Chief Complaint  Patient presents with  . New Evaluation    Gallstones    HPI DEZRA MANDELLA is a 69 y.o. female here today for an evaluation for gallstones. Patient states she had a ct scan do and they gallstones. She states she is not having any abdominal pain .  HPI  Past Medical History  Diagnosis Date  . Hypertension   . Ulcer of left ankle ([place])   . Peripheral vascular disease ([place])   . Constipation   . Arthritis   . Psoriasis   . Skin ulcer of right great toe ([place])   . Wears dentures   . Complication of anesthesia     gets weak after surgery  . GERD (gastroesophageal reflux disease)     Past Surgical History  Procedure Laterality Date  . Cataract extraction w/ intraocular lens  implant, bilateral  2015  . Endovascular stent insertion  sept  &  oct  2015    LEFT LEG STENTING--  common and superficial femoral artery  . Cardiovascular stress test  03-03-2014  dr Saralyn Pilar    normal lexi scan sestamibi study/  normal LVF without evidence for significant scar or ischemia  . Transthoracic echocardiogram  03-03-2014    normal LVF/  ef 55-60%/  mild TI and MI  . Incision and drainage of wound Bilateral 04/13/2014    Procedure: IRRIGATION AND DEBRIDEMENT OF LEFT ANKLE WOUND AND RIGHT FOOT;  Surgeon: Theodoro Kos, DO;  Location: [facility];  Service: Plastics;  Laterality: Bilateral;  . Application of a-cell of extremity Left 04/13/2014    Procedure: PLACEMENT OF APPLICATION OF A-CELL ;  Surgeon: Theodoro Kos, DO;  Location: [facility];  Service: Plastics;  Laterality: Left;  . I&d extremity Left 09/10/2014    Procedure: IRRIGATION AND DEBRIDEMENT LEFT ANKLE WOUND SURGICAL PREP ;  Surgeon: Theodoro Kos, DO;  Location: [facility];  Service: Plastics;  Laterality: Left;  . Application of a-cell of extremity Left 09/10/2014    Procedure:  APPLICATION OF A-CELL OF EXTREMITY;  Surgeon: Theodoro Kos, DO;  Location: [facility];  Service: Plastics;  Laterality: Left;  . Endovascular stent insertion  May 27, 2014    LifeStent bare metal stent left SFA    Family History  Problem Relation Age of Onset  . Breast cancer Maternal Aunt 60  . Kidney disease Mother   . Hypertension Mother   . Heart disease Mother   . Heart disease Sister   . Stroke Sister     Social History Social History  Substance Use Topics  . Smoking status: Former Smoker -- 0.50 packs/day for 42 years    Types: Cigarettes    Quit date: 12/24/2013  . Smokeless tobacco: Former Systems developer  . Alcohol Use: No    Allergies  Allergen Reactions  . Clarithromycin Rash    "Mycin"  . Methocarbamol Rash  . Minocin [Minocycline Hcl] Rash  . Tetracycline Rash  . Tetracyclines & Related Rash    Current Outpatient Prescriptions  Medication Sig Dispense Refill  . alendronate (FOSAMAX) 70 MG tablet Take 70 mg by mouth once a week. Take with a full glass of water on an empty stomach.--  TAKES ON SUNDAY'S    . amLODipine (NORVASC) 10 MG tablet TAKE ONE (1) TABLET EACH DAY 90 tablet 3  . aspirin EC 81 MG tablet Take 81  mg by mouth daily.    . clopidogrel (PLAVIX) 75 MG tablet Take 75 mg by mouth daily.    . fluticasone (FLONASE) 50 MCG/ACT nasal spray Place 2 sprays into both nostrils daily. 16 g 2  . loratadine (CLARITIN) 10 MG tablet Take 10 mg by mouth daily.    . Magnesium 400 MG CAPS Take by mouth.    . meloxicam (MOBIC) 15 MG tablet Take 15 mg by mouth every evening.     . triamcinolone cream (KENALOG) 0.1 % Apply 1 application topically 2 (two) times daily.     No current facility-administered medications for this visit.    Review of Systems Review of Systems  Constitutional: Negative.   Respiratory: Negative.   Cardiovascular: Negative.     Blood pressure 132/68, pulse 78, resp. rate 16, height 5\' 4"  (1.626 m), weight 140 lb (63.504  kg).  Physical Exam Physical Exam  Constitutional: She is oriented to person, place, and time. She appears well-developed and well-nourished.  Eyes: Conjunctivae are normal. No scleral icterus.  Neck: Neck supple.  Cardiovascular: Normal rate and regular rhythm.   Murmur heard.  Systolic murmur is present with a grade of 2/6  Pulmonary/Chest: Effort normal and breath sounds normal.  Abdominal: Soft. Normal appearance and bowel sounds are normal. There is no hepatomegaly. There is no tenderness.  Lymphadenopathy:    She has no cervical adenopathy.  Neurological: She is alert and oriented to person, place, and time.  Skin: Skin is warm and dry.    Data Reviewed October 2015-December 2015 operative report: Open left femoral/profunda endarterectomy; SFA dilatation; balloon dilatation, stent placement SFA. Bare-metal stent utilized.  CT scan of the abdomen and pelvis for aortography of 05/07/2014 was reviewed. Diffuse vascular plaquing. Scattered gallbladder wall calcification. Suggestion of mild intrahepatic/extrahepatic biliary ductal dilatation.  CT scan of the abdomen and pelvis for aortography of 01/27/2015 showed patchy calcification of the gallbladder wall. Previously noted intra-and extrahepatic biliary dilatation no longer appreciated. No mention of cholelithiasis.  Review of these films suggest the possibility of a large stone, which is associated with increased risk of gallbladder cancer as opposed to small stones in the face of porcelain gallbladder.  CBC from October 2015 showed a hemoglobin 10.7. Normal MCV of 94.  CBC from January 2016 showed a hemoglobin of 9.5 with an MCV of 93.  PT/PTT both normal fall 2015.   Assessment    Porcelain gallbladder.  Extensive lower extremity vascular disease status post bare metal stent placement December 2015.  Ongoing Plavix therapy    Plan    The original literature regarding the increased risk of gallbladder cancer with a  "porcelain gallbladder" related to those seen on plain films. The more recent data for calcification in the gallbladder wall noted on CT imaging is less impressive in regards to the risk of gallbladder cancer, although it still remains significantly elevated at 2-3%, with the overall incidence of gallbladder cancer without calcification being approximately 1 and 100,000.  The patient is not symptomatic in regards to biliary tract disease, but an increased risk of biliary cancer is certainly of concern, as it has a dismal outcome if not treated early when it is a T1, mucosal lesion.  I think the patient will be best served by elective cholecystectomy. He will be necessary for her Plavix to be discontinued for 1 week, but aspirin therapy can be maintained during this time to minimize distal clotting.  Prior surgical intervention abdominal ultrasound with special attention of the  gallbladder will be obtained as well as liver function studies.    Laparoscopic Cholecystectomy with Intraoperative Cholangiogram. The procedure, including it's potential risks and complications (including but not limited to infection, bleeding, injury to intra-abdominal organs or bile ducts, bile leak, poor cosmetic result, sepsis and death) were discussed with the patient in detail. Non-operative options, including their inherent risks (acute calculous cholecystitis with possible choledocholithiasis or gallstone pancreatitis, with the risk of ascending cholangitis, sepsis, and death) were discussed as well. The patient expressed and understanding of what we discussed and wishes to proceed with laparoscopic cholecystectomy. The patient further understands that if it is technically not possible, or it is unsafe to proceed laparoscopically, that I will convert to an open cholecystectomy.  PJP:ETKKO Wynn Banker 03/05/2015, 8:14 PM

## 2015-03-05 ENCOUNTER — Encounter: Payer: Self-pay | Admitting: General Surgery

## 2015-03-05 DIAGNOSIS — K828 Other specified diseases of gallbladder: Secondary | ICD-10-CM | POA: Insufficient documentation

## 2015-03-08 ENCOUNTER — Telehealth: Payer: Self-pay

## 2015-03-08 ENCOUNTER — Other Ambulatory Visit: Payer: Self-pay

## 2015-03-08 DIAGNOSIS — K828 Other specified diseases of gallbladder: Secondary | ICD-10-CM

## 2015-03-08 NOTE — Telephone Encounter (Signed)
Spoke with patient's daughter regarding abdominal ultrasound, lab work, and follow up visit. Patient is scheduled for an abdominal ultrasound at [facility] on 03/09/15 at 10 am. She is to arrive by 9:45 am and have nothing to eat or drink after midnight. She will have labs drawn afterward. She is scheduled to follow up here with Dr Bary Castilla on 03/22/15 at 2:30 pm. The patient is aware of dates, times, and instructions.

## 2015-03-08 NOTE — Telephone Encounter (Signed)
-----   Message from Robert Bellow, MD sent at 03/05/2015  8:15 PM EDT ----- Please arrange for a limited ultrasound of the abdomen with special attention to the gallbladder regarding porcelain gallbladder as well as a CBC with differential and platelets, comprehensive metabolic panel with an office visit to follow up to discuss cholecystectomy.

## 2015-03-09 ENCOUNTER — Ambulatory Visit
Admission: RE | Admit: 2015-03-09 | Discharge: 2015-03-09 | Disposition: A | Discharge status: Home / Self Care | Payer: Commercial Managed Care - HMO | Source: Ambulatory Visit | Attending: General Surgery | Admitting: General Surgery

## 2015-03-09 DIAGNOSIS — K8020 Calculus of gallbladder without cholecystitis without obstruction: Secondary | ICD-10-CM | POA: Insufficient documentation

## 2015-03-09 DIAGNOSIS — R1011 Right upper quadrant pain: Secondary | ICD-10-CM | POA: Diagnosis not present

## 2015-03-09 DIAGNOSIS — K828 Other specified diseases of gallbladder: Secondary | ICD-10-CM | POA: Insufficient documentation

## 2015-03-09 LAB — CBC WITH DIFFERENTIAL/PLATELET
Basophils Absolute: 0.1 10*3/uL (ref 0–0.1)
Basophils Relative: 1 %
Eosinophils Absolute: 0.4 10*3/uL (ref 0–0.7)
Eosinophils Relative: 5 %
HEMATOCRIT: 37.2 % (ref 35.0–47.0)
HEMOGLOBIN: 12.5 g/dL (ref 12.0–16.0)
LYMPHS ABS: 1.2 10*3/uL (ref 1.0–3.6)
LYMPHS PCT: 13 %
MCH: 30.1 pg (ref 26.0–34.0)
MCHC: 33.6 g/dL (ref 32.0–36.0)
MCV: 89.4 fL (ref 80.0–100.0)
MONO ABS: 0.5 10*3/uL (ref 0.2–0.9)
MONOS PCT: 6 %
NEUTROS ABS: 6.4 10*3/uL (ref 1.4–6.5)
NEUTROS PCT: 75 %
Platelets: 346 10*3/uL (ref 150–440)
RBC: 4.16 MIL/uL (ref 3.80–5.20)
RDW: 13.1 % (ref 11.5–14.5)
WBC: 8.7 10*3/uL (ref 3.6–11.0)

## 2015-03-11 ENCOUNTER — Other Ambulatory Visit: Payer: Self-pay

## 2015-03-11 DIAGNOSIS — K828 Other specified diseases of gallbladder: Secondary | ICD-10-CM

## 2015-03-11 LAB — COMPREHENSIVE METABOLIC PANEL
ALBUMIN: 4.1 g/dL (ref 3.5–5.0)
ALK PHOS: 55 U/L (ref 38–126)
ALT: 21 U/L (ref 14–54)
ANION GAP: 9 (ref 5–15)
AST: 44 U/L — AB (ref 15–41)
BILIRUBIN TOTAL: 0.2 mg/dL — AB (ref 0.3–1.2)
BUN: 9 mg/dL (ref 6–20)
CALCIUM: 9.4 mg/dL (ref 8.9–10.3)
CO2: 24 mmol/L (ref 22–32)
Chloride: 103 mmol/L (ref 101–111)
Creatinine, Ser: 0.76 mg/dL (ref 0.44–1.00)
GFR calc Af Amer: >60 mL/min (ref >60)
GFR calc non Af Amer: >60 mL/min (ref >60)
GLUCOSE: 99 mg/dL (ref 65–99)
Potassium: 4.3 mmol/L (ref 3.5–5.1)
Sodium: 136 mmol/L (ref 135–145)
TOTAL PROTEIN: 8.2 g/dL — AB (ref 6.5–8.1)

## 2015-03-22 ENCOUNTER — Encounter: Payer: Self-pay | Admitting: General Surgery

## 2015-03-22 ENCOUNTER — Ambulatory Visit (INDEPENDENT_AMBULATORY_CARE_PROVIDER_SITE_OTHER): Payer: Commercial Managed Care - HMO | Admitting: General Surgery

## 2015-03-22 VITALS — BP 112/68 | HR 72 | Resp 12 | Ht 64.0 in | Wt 140.0 lb

## 2015-03-22 DIAGNOSIS — K828 Other specified diseases of gallbladder: Secondary | ICD-10-CM

## 2015-03-22 DIAGNOSIS — K8020 Calculus of gallbladder without cholecystitis without obstruction: Secondary | ICD-10-CM

## 2015-03-22 NOTE — Progress Notes (Signed)
Patient ID: Tracey Rogers, female   DOB: Sep 12, 1945, 69 y.o.   MRN: [id number]  Chief Complaint  Patient presents with  . Follow-up    Gallbladder    HPI Tracey Rogers is a 69 y.o. female here for a follow up for her gallbladder. She had a ultrasound done on 03/09/15. She denies any dietary and bowel issues. The patient reports that she experiences some mild postprandial abdominal discomfort, nothing like pain, and nothing that prevents her from eating specific foods. She is aware of a little bit of bloating. She tends to eat a low-fat diet. HPI  Past Medical History  Diagnosis Date  . Hypertension   . Ulcer of left ankle ([place])   . Peripheral vascular disease ([place])   . Constipation   . Arthritis   . Psoriasis   . Skin ulcer of right great toe ([place])   . Wears dentures   . Complication of anesthesia     gets weak after surgery  . GERD (gastroesophageal reflux disease)     Past Surgical History  Procedure Laterality Date  . Cataract extraction w/ intraocular lens  implant, bilateral  2015  . Endovascular stent insertion  sept  &  oct  2015    LEFT LEG STENTING--  common and superficial femoral artery  . Cardiovascular stress test  03-03-2014  dr Saralyn Pilar    normal lexi scan sestamibi study/  normal LVF without evidence for significant scar or ischemia  . Transthoracic echocardiogram  03-03-2014    normal LVF/  ef 55-60%/  mild TI and MI  . Incision and drainage of wound Bilateral 04/13/2014    Procedure: IRRIGATION AND DEBRIDEMENT OF LEFT ANKLE WOUND AND RIGHT FOOT;  Surgeon: Theodoro Kos, DO;  Location: [facility];  Service: Plastics;  Laterality: Bilateral;  . Application of a-cell of extremity Left 04/13/2014    Procedure: PLACEMENT OF APPLICATION OF A-CELL ;  Surgeon: Theodoro Kos, DO;  Location: [facility];  Service: Plastics;  Laterality: Left;  . I&d extremity Left 09/10/2014    Procedure: IRRIGATION AND DEBRIDEMENT LEFT ANKLE  WOUND SURGICAL PREP ;  Surgeon: Theodoro Kos, DO;  Location: [facility];  Service: Plastics;  Laterality: Left;  . Application of a-cell of extremity Left 09/10/2014    Procedure: APPLICATION OF A-CELL OF EXTREMITY;  Surgeon: Theodoro Kos, DO;  Location: [facility];  Service: Plastics;  Laterality: Left;  . Endovascular stent insertion  May 27, 2014    LifeStent bare metal stent left SFA    Family History  Problem Relation Age of Onset  . Breast cancer Maternal Aunt 60  . Kidney disease Mother   . Hypertension Mother   . Heart disease Mother   . Heart disease Sister   . Stroke Sister     Social History Social History  Substance Use Topics  . Smoking status: Former Smoker -- 0.50 packs/day for 42 years    Types: Cigarettes    Quit date: 12/24/2013  . Smokeless tobacco: Former Systems developer  . Alcohol Use: No    Allergies  Allergen Reactions  . Clarithromycin Rash    "Mycin"  . Methocarbamol Rash  . Minocin [Minocycline Hcl] Rash  . Tetracycline Rash  . Tetracyclines & Related Rash    Current Outpatient Prescriptions  Medication Sig Dispense Refill  . alendronate (FOSAMAX) 70 MG tablet Take 70 mg by mouth once a week. Take with a full glass of water on an empty stomach.--  TAKES ON SUNDAY'S    . amLODipine (NORVASC) 10 MG tablet TAKE ONE (1) TABLET EACH DAY 90 tablet 3  . aspirin EC 81 MG tablet Take 81 mg by mouth daily.    . clopidogrel (PLAVIX) 75 MG tablet Take 75 mg by mouth daily.    . fluticasone (FLONASE) 50 MCG/ACT nasal spray Place 2 sprays into both nostrils daily. 16 g 2  . loratadine (CLARITIN) 10 MG tablet Take 10 mg by mouth daily.    . Magnesium 400 MG CAPS Take by mouth.    . meloxicam (MOBIC) 15 MG tablet Take 15 mg by mouth every evening.     . triamcinolone cream (KENALOG) 0.1 % Apply 1 application topically 2 (two) times daily.     No current facility-administered medications for this visit.    Review of Systems Review  of Systems  Constitutional: Negative.   Respiratory: Negative.   Cardiovascular: Negative.     Blood pressure 112/68, pulse 72, resp. rate 12, height 5\' 4"  (1.626 m), weight 140 lb (63.504 kg).  Physical Exam Physical Exam  Constitutional: She is oriented to person, place, and time. She appears well-developed and well-nourished.  Eyes: Conjunctivae are normal. No scleral icterus.  Cardiovascular: Normal rate, regular rhythm and normal heart sounds.   Pulmonary/Chest: Effort normal and breath sounds normal.  Neurological: She is alert and oriented to person, place, and time.  Skin: Skin is warm and dry.  Psychiatric: Her behavior is normal.    Data Reviewed Ultrasound showed evidence of calcifications within the gallbladder wall as well as a large gallstone.  Assessment    Cholelithiasis, porcelain gallbladder.    Plan    Literature review shows a 2-3% risk of malignancy with incomplete gallbladder wall calcification and large stones. For that reason she is felt to be a candidate for cholecystectomy. It will be necessary to discontinue her Plavix one week prior to the procedure. She had a bare metal stent placed in December, and this should not present any problem. He will continue on her aspirin.     Laparoscopic Cholecystectomy with Intraoperative Cholangiogram. The procedure, including it's potential risks and complications (including but not limited to infection, bleeding, injury to intra-abdominal organs or bile ducts, bile leak, poor cosmetic result, sepsis and death) were discussed with the patient in detail. Non-operative options, including their inherent risks (acute calculous cholecystitis with possible choledocholithiasis or gallstone pancreatitis, with the risk of ascending cholangitis, sepsis, and death) were discussed as well. The patient expressed and understanding of what we discussed and wishes to proceed with laparoscopic cholecystectomy. The patient further  understands that if it is technically not possible, or it is unsafe to proceed laparoscopically, that I will convert to an open cholecystectomy.  Patient aware to hold Plavix one week prior to surgery. Patient aware not to pick up anything over 10 lbs after surgery, and cannot drive until pain free.   This patient is requesting that she wait to have surgery until the beginning of next year. Patient will be contacted once January 2017 schedule is available to arrange a date for surgery. Patient has been asked to stop Plavix seven days prior to procedure.   PCP: Margarita Rana, MD   Robert Bellow 03/23/2015, 12:09 PM

## 2015-03-22 NOTE — Patient Instructions (Signed)
Laparoscopic Cholecystectomy Laparoscopic cholecystectomy is surgery to remove the gallbladder. The gallbladder is located in the upper right part of the abdomen, behind the liver. It is a storage sac for bile, which is produced in the liver. Bile aids in the digestion and absorption of fats. Cholecystectomy is often done for inflammation of the gallbladder (cholecystitis). This condition is usually caused by a buildup of gallstones (cholelithiasis) in the gallbladder. Gallstones can block the flow of bile, and that can result in inflammation and pain. In severe cases, emergency surgery may be required. If emergency surgery is not required, you will have time to prepare for the procedure. Laparoscopic surgery is an alternative to open surgery. Laparoscopic surgery has a shorter recovery time. Your common bile duct may also need to be examined during the procedure. If stones are found in the common bile duct, they may be removed. LET YOUR HEALTH CARE PROVIDER KNOW ABOUT:  Any allergies you have.  All medicines you are taking, including vitamins, herbs, eye drops, creams, and over-the-counter medicines.  Previous problems you or members of your family have had with the use of anesthetics.  Any blood disorders you have.  Previous surgeries you have had.  Any medical conditions you have. RISKS AND COMPLICATIONS Generally, this is a safe procedure. However, problems may occur, including:  Infection.  Bleeding.  Allergic reactions to medicines.  Damage to other structures or organs.  A stone remaining in the common bile duct.  A bile leak from the cyst duct that is clipped when your gallbladder is removed.  The need to convert to open surgery, which requires a larger incision in the abdomen. This may be necessary if your surgeon thinks that it is not safe to continue with a laparoscopic procedure. BEFORE THE PROCEDURE  Ask your health care provider about:  Changing or stopping your  regular medicines. This is especially important if you are taking diabetes medicines or blood thinners.  Taking medicines such as aspirin and ibuprofen. These medicines can thin your blood. Do not take these medicines before your procedure if your health care provider instructs you not to.  Follow instructions from your health care provider about eating or drinking restrictions.  Let your health care provider know if you develop a cold or an infection before surgery.  Plan to have someone take you home after the procedure.  Ask your health care provider how your surgical site will be marked or identified.  You may be given antibiotic medicine to help prevent infection. PROCEDURE  To reduce your risk of infection:  Your health care team will wash or sanitize their hands.  Your skin will be washed with soap.  An IV tube may be inserted into one of your veins.  You will be given a medicine to make you fall asleep (general anesthetic).  A breathing tube will be placed in your mouth.  The surgeon will make several small cuts (incisions) in your abdomen.  A thin, lighted tube (laparoscope) that has a tiny camera on the end will be inserted through one of the small incisions. The camera on the laparoscope will send a picture to a TV screen (monitor) in the operating room. This will give the surgeon a good view inside your abdomen.  A gas will be pumped into your abdomen. This will expand your abdomen to give the surgeon more room to perform the surgery.  Other tools that are needed for the procedure will be inserted through the other incisions. The gallbladder will   be removed through one of the incisions.  After your gallbladder has been removed, the incisions will be closed with stitches (sutures), staples, or skin glue.  Your incisions may be covered with a bandage (dressing). The procedure may vary among health care providers and hospitals. AFTER THE PROCEDURE  Your blood  pressure, heart rate, breathing rate, and blood oxygen level will be monitored often until the medicines you were given have worn off.  You will be given medicines as needed to control your pain.   This information is not intended to replace advice given to you by your health care provider. Make sure you discuss any questions you have with your health care provider.   Document Released: 05/15/2005 Document Revised: 02/03/2015 Document Reviewed: 12/25/2012 Elsevier Interactive Patient Education 2016 [employer] Inc.  

## 2015-04-15 ENCOUNTER — Encounter: Payer: Self-pay | Admitting: *Deleted

## 2015-04-15 NOTE — Progress Notes (Signed)
Patient ID: Tracey Rogers, female   DOB: [birth year], 69 y.o.   MRN: [id number]  Patient's surgery has been scheduled for 06-02-15 at [facility].  This patient will need to discontinue Plavix for 7 days prior to procedure. It is okay for patient to continue 81 mg aspirin once daily.   Patient will be asked to come back to the office for a pre-op visit prior to surgery.

## 2015-04-27 ENCOUNTER — Other Ambulatory Visit: Payer: Self-pay | Admitting: General Surgery

## 2015-04-27 DIAGNOSIS — K828 Other specified diseases of gallbladder: Secondary | ICD-10-CM

## 2015-04-27 NOTE — H&P (Signed)
Patient ID: Tracey Rogers, female   DOB: 06/07/1945, 69 y.o.   MRN: [id number]  Chief Complaint  Patient presents with  . New Evaluation    Gallstones    HPI Tracey Rogers is a 69 y.o. female here today for an evaluation for gallstones. Patient states she had a ct scan do and they gallstones. She states she is not having any abdominal pain .  HPI  Past Medical History  Diagnosis Date  . Hypertension   . Ulcer of left ankle ([facility])   . Peripheral vascular disease ([facility])   . Constipation   . Arthritis   . Psoriasis   . Skin ulcer of right great toe ([facility])   . Wears dentures   . Complication of anesthesia     gets weak after surgery  . GERD (gastroesophageal reflux disease)     Past Surgical History  Procedure Laterality Date  . Cataract extraction w/ intraocular lens  implant, bilateral  2015  . Endovascular stent insertion  sept  &  oct  2015    LEFT LEG STENTING--  common and superficial femoral artery  . Cardiovascular stress test  03-03-2014  dr paraschos    normal lexi scan sestamibi study/  normal LVF without evidence for significant scar or ischemia  . Transthoracic echocardiogram  03-03-2014    normal LVF/  ef 55-60%/  mild TI and MI  . Incision and drainage of wound Bilateral 04/13/2014    Procedure: IRRIGATION AND DEBRIDEMENT OF LEFT ANKLE WOUND AND RIGHT FOOT;  Surgeon: Claire Sanger, DO;  Location: [facility];  Service: Plastics;  Laterality: Bilateral;  . Application of a-cell of extremity Left 04/13/2014    Procedure: PLACEMENT OF APPLICATION OF A-CELL ;  Surgeon: Claire Sanger, DO;  Location: [facility];  Service: Plastics;  Laterality: Left;  . I&d extremity Left 09/10/2014    Procedure: IRRIGATION AND DEBRIDEMENT LEFT ANKLE WOUND SURGICAL PREP ;  Surgeon: Claire Sanger, DO;  Location: [facility];  Service: Plastics;  Laterality: Left;  . Application of a-cell of extremity Left 09/10/2014    Procedure:  APPLICATION OF A-CELL OF EXTREMITY;  Surgeon: Claire Sanger, DO;  Location: [facility];  Service: Plastics;  Laterality: Left;  . Endovascular stent insertion  May 27, 2014    LifeStent bare metal stent left SFA    Family History  Problem Relation Age of Onset  . Breast cancer Maternal Aunt 60  . Kidney disease Mother   . Hypertension Mother   . Heart disease Mother   . Heart disease Sister   . Stroke Sister     Social History Social History  Substance Use Topics  . Smoking status: Former Smoker -- 0.50 packs/day for 42 years    Types: Cigarettes    Quit date: 12/24/2013  . Smokeless tobacco: Former User  . Alcohol Use: No    Allergies  Allergen Reactions  . Clarithromycin Rash    "Mycin"  . Methocarbamol Rash  . Minocin [Minocycline Hcl] Rash  . Tetracycline Rash  . Tetracyclines & Related Rash    Current Outpatient Prescriptions  Medication Sig Dispense Refill  . alendronate (FOSAMAX) 70 MG tablet Take 70 mg by mouth once a week. Take with a full glass of water on an empty stomach.--  TAKES ON SUNDAY'S    . amLODipine (NORVASC) 10 MG tablet TAKE ONE (1) TABLET EACH DAY 90 tablet 3  . aspirin EC 81 MG tablet Take 81   mg by mouth daily.    . clopidogrel (PLAVIX) 75 MG tablet Take 75 mg by mouth daily.    . fluticasone (FLONASE) 50 MCG/ACT nasal spray Place 2 sprays into both nostrils daily. 16 g 2  . loratadine (CLARITIN) 10 MG tablet Take 10 mg by mouth daily.    . Magnesium 400 MG CAPS Take by mouth.    . meloxicam (MOBIC) 15 MG tablet Take 15 mg by mouth every evening.     . triamcinolone cream (KENALOG) 0.1 % Apply 1 application topically 2 (two) times daily.     No current facility-administered medications for this visit.    Review of Systems Review of Systems  Constitutional: Negative.   Respiratory: Negative.   Cardiovascular: Negative.     Blood pressure 132/68, pulse 78, resp. rate 16, height 5' 4" (1.626 m), weight 140 lb (63.504  kg).  Physical Exam Physical Exam  Constitutional: She is oriented to person, place, and time. She appears well-developed and well-nourished.  Eyes: Conjunctivae are normal. No scleral icterus.  Neck: Neck supple.  Cardiovascular: Normal rate and regular rhythm.   Murmur heard.  Systolic murmur is present with a grade of 2/6  Pulmonary/Chest: Effort normal and breath sounds normal.  Abdominal: Soft. Normal appearance and bowel sounds are normal. There is no hepatomegaly. There is no tenderness.  Lymphadenopathy:    She has no cervical adenopathy.  Neurological: She is alert and oriented to person, place, and time.  Skin: Skin is warm and dry.    Data Reviewed October 2015-December 2015 operative report: Open left femoral/profunda endarterectomy; SFA dilatation; balloon dilatation, stent placement SFA. Bare-metal stent utilized.  CT scan of the abdomen and pelvis for aortography of 05/07/2014 was reviewed. Diffuse vascular plaquing. Scattered gallbladder wall calcification. Suggestion of mild intrahepatic/extrahepatic biliary ductal dilatation.  CT scan of the abdomen and pelvis for aortography of 01/27/2015 showed patchy calcification of the gallbladder wall. Previously noted intra-and extrahepatic biliary dilatation no longer appreciated. No mention of cholelithiasis.  Review of these films suggest the possibility of a large stone, which is associated with increased risk of gallbladder cancer as opposed to small stones in the face of porcelain gallbladder.  CBC from October 2015 showed a hemoglobin 10.7. Normal MCV of 94.  CBC from January 2016 showed a hemoglobin of 9.5 with an MCV of 93.  PT/PTT both normal fall 2015.   Assessment    Porcelain gallbladder.  Extensive lower extremity vascular disease status post bare metal stent placement December 2015.  Ongoing Plavix therapy    Plan    The original literature regarding the increased risk of gallbladder cancer with a  "porcelain gallbladder" related to those seen on plain films. The more recent data for calcification in the gallbladder wall noted on CT imaging is less impressive in regards to the risk of gallbladder cancer, although it still remains significantly elevated at 2-3%, with the overall incidence of gallbladder cancer without calcification being approximately 1 and 100,000.  The patient is not symptomatic in regards to biliary tract disease, but an increased risk of biliary cancer is certainly of concern, as it has a dismal outcome if not treated early when it is a T1, mucosal lesion.  I think the patient will be best served by elective cholecystectomy. He will be necessary for her Plavix to be discontinued for 1 week, but aspirin therapy can be maintained during this time to minimize distal clotting.  Prior surgical intervention abdominal ultrasound with special attention of the   gallbladder will be obtained as well as liver function studies.    Laparoscopic Cholecystectomy with Intraoperative Cholangiogram. The procedure, including it's potential risks and complications (including but not limited to infection, bleeding, injury to intra-abdominal organs or bile ducts, bile leak, poor cosmetic result, sepsis and death) were discussed with the patient in detail. Non-operative options, including their inherent risks (acute calculous cholecystitis with possible choledocholithiasis or gallstone pancreatitis, with the risk of ascending cholangitis, sepsis, and death) were discussed as well. The patient expressed and understanding of what we discussed and wishes to proceed with laparoscopic cholecystectomy. The patient further understands that if it is technically not possible, or it is unsafe to proceed laparoscopically, that I will convert to an open cholecystectomy.  PCP:Nancy Maloney  Byrnett, Jeffrey W 03/05/2015, 8:14 PM    

## 2015-05-03 DIAGNOSIS — I6529 Occlusion and stenosis of unspecified carotid artery: Secondary | ICD-10-CM | POA: Diagnosis not present

## 2015-05-03 DIAGNOSIS — E785 Hyperlipidemia, unspecified: Secondary | ICD-10-CM | POA: Diagnosis not present

## 2015-05-03 DIAGNOSIS — L97509 Non-pressure chronic ulcer of other part of unspecified foot with unspecified severity: Secondary | ICD-10-CM | POA: Diagnosis not present

## 2015-05-03 DIAGNOSIS — I70299 Other atherosclerosis of native arteries of extremities, unspecified extremity: Secondary | ICD-10-CM | POA: Diagnosis not present

## 2015-05-03 DIAGNOSIS — I10 Essential (primary) hypertension: Secondary | ICD-10-CM | POA: Diagnosis not present

## 2015-05-03 DIAGNOSIS — I739 Peripheral vascular disease, unspecified: Secondary | ICD-10-CM | POA: Diagnosis not present

## 2015-05-03 DIAGNOSIS — L97309 Non-pressure chronic ulcer of unspecified ankle with unspecified severity: Secondary | ICD-10-CM | POA: Diagnosis not present

## 2015-05-03 DIAGNOSIS — I998 Other disorder of circulatory system: Secondary | ICD-10-CM | POA: Diagnosis not present

## 2015-05-05 DIAGNOSIS — I10 Essential (primary) hypertension: Secondary | ICD-10-CM | POA: Diagnosis not present

## 2015-05-05 DIAGNOSIS — I6529 Occlusion and stenosis of unspecified carotid artery: Secondary | ICD-10-CM | POA: Diagnosis not present

## 2015-05-05 DIAGNOSIS — I739 Peripheral vascular disease, unspecified: Secondary | ICD-10-CM | POA: Diagnosis not present

## 2015-05-05 DIAGNOSIS — L97509 Non-pressure chronic ulcer of other part of unspecified foot with unspecified severity: Secondary | ICD-10-CM | POA: Diagnosis not present

## 2015-05-05 DIAGNOSIS — I998 Other disorder of circulatory system: Secondary | ICD-10-CM | POA: Diagnosis not present

## 2015-05-05 DIAGNOSIS — L97309 Non-pressure chronic ulcer of unspecified ankle with unspecified severity: Secondary | ICD-10-CM | POA: Diagnosis not present

## 2015-05-05 DIAGNOSIS — E785 Hyperlipidemia, unspecified: Secondary | ICD-10-CM | POA: Diagnosis not present

## 2015-05-05 DIAGNOSIS — I70299 Other atherosclerosis of native arteries of extremities, unspecified extremity: Secondary | ICD-10-CM | POA: Diagnosis not present

## 2015-05-18 ENCOUNTER — Encounter
Admission: RE | Admit: 2015-05-18 | Discharge: 2015-05-18 | Disposition: A | Discharge status: Home / Self Care | Payer: Commercial Managed Care - HMO | Source: Ambulatory Visit | Attending: General Surgery | Admitting: General Surgery

## 2015-05-18 ENCOUNTER — Encounter: Payer: Self-pay | Admitting: General Surgery

## 2015-05-18 ENCOUNTER — Ambulatory Visit (INDEPENDENT_AMBULATORY_CARE_PROVIDER_SITE_OTHER): Payer: Commercial Managed Care - HMO | Admitting: General Surgery

## 2015-05-18 VITALS — BP 120/64 | HR 74 | Resp 14 | Ht 64.0 in | Wt 139.0 lb

## 2015-05-18 DIAGNOSIS — Z01810 Encounter for preprocedural cardiovascular examination: Secondary | ICD-10-CM | POA: Insufficient documentation

## 2015-05-18 DIAGNOSIS — K828 Other specified diseases of gallbladder: Secondary | ICD-10-CM

## 2015-05-18 DIAGNOSIS — R42 Dizziness and giddiness: Secondary | ICD-10-CM | POA: Diagnosis not present

## 2015-05-18 DIAGNOSIS — H6123 Impacted cerumen, bilateral: Secondary | ICD-10-CM | POA: Diagnosis not present

## 2015-05-18 DIAGNOSIS — I10 Essential (primary) hypertension: Secondary | ICD-10-CM | POA: Diagnosis not present

## 2015-05-18 HISTORY — DX: Family history of other specified conditions: Z84.89

## 2015-05-18 HISTORY — DX: Cardiac murmur, unspecified: R01.1

## 2015-05-18 HISTORY — DX: Chronic obstructive pulmonary disease, unspecified: J44.9

## 2015-05-18 NOTE — Patient Instructions (Addendum)
Patient is scheduled for surgery on 06/02/15. Patient to stop her PLAVIX on December 28,2016 and take aspirin daily.

## 2015-05-18 NOTE — Progress Notes (Signed)
Patient ID: Tracey Rogers, female   DOB: Aug 24, 1945, 69 y.o.   MRN: [id number]  Chief Complaint  Patient presents with  . Pre-op Exam    lap chole    HPI Tracey Rogers is a 69 y.o. female here today for her pre o p gallbladder surgery scheduled on 06/02/15.  Patient states she had some dizziness for about a month now on the right side when laying down The symptoms resolved when she turns back to neutral or the left side. The patient is scheduled for ENT evaluation this afternoon with Anda Latina, M.D.  The patient is pain-free at at this time, the gallbladder changes noted on CT scanning for her peripheral vascular disease.  I personally reviewed the patient's history. HPI  Past Medical History  Diagnosis Date  . Hypertension   . Ulcer of left ankle ([place])   . Peripheral vascular disease ([place])   . Constipation   . Arthritis   . Psoriasis   . Skin ulcer of right great toe ([place])   . Wears dentures   . GERD (gastroesophageal reflux disease)   . Complication of anesthesia     gets weak after surgery  . Family history of adverse reaction to anesthesia     son gets postop nausea and vomiting, weak  . Heart murmur   . COPD (chronic obstructive pulmonary disease) (HCC)     mild    Past Surgical History  Procedure Laterality Date  . Cataract extraction w/ intraocular lens  implant, bilateral  2015  . Endovascular stent insertion  sept  &  oct  2015    LEFT LEG STENTING--  common and superficial femoral artery  . Cardiovascular stress test  03-03-2014  dr Saralyn Pilar    normal lexi scan sestamibi study/  normal LVF without evidence for significant scar or ischemia  . Transthoracic echocardiogram  03-03-2014    normal LVF/  ef 55-60%/  mild TI and MI  . Incision and drainage of wound Bilateral 04/13/2014    Procedure: IRRIGATION AND DEBRIDEMENT OF LEFT ANKLE WOUND AND RIGHT FOOT;  Surgeon: Theodoro Kos, DO;  Location: [place];  Service: Plastics;   Laterality: Bilateral;  . Application of a-cell of extremity Left 04/13/2014    Procedure: PLACEMENT OF APPLICATION OF A-CELL ;  Surgeon: Theodoro Kos, DO;  Location: [place];  Service: Plastics;  Laterality: Left;  . I&d extremity Left 09/10/2014    Procedure: IRRIGATION AND DEBRIDEMENT LEFT ANKLE WOUND SURGICAL PREP ;  Surgeon: Theodoro Kos, DO;  Location: [place];  Service: Plastics;  Laterality: Left;  . Application of a-cell of extremity Left 09/10/2014    Procedure: APPLICATION OF A-CELL OF EXTREMITY;  Surgeon: Theodoro Kos, DO;  Location: [place];  Service: Plastics;  Laterality: Left;  . Endovascular stent insertion  May 27, 2014    LifeStent bare metal stent left SFA    Family History  Problem Relation Age of Onset  . Breast cancer Maternal Aunt 60  . Kidney disease Mother   . Hypertension Mother   . Heart disease Mother   . Heart disease Sister   . Stroke Sister     Social History Social History  Substance Use Topics  . Smoking status: Former Smoker -- 0.50 packs/day for 42 years    Types: Cigarettes    Quit date: 12/24/2013  . Smokeless tobacco: Former Systems developer  . Alcohol Use: No    Allergies  Allergen Reactions  .  Clarithromycin Rash    "Mycin"  . Methocarbamol Rash  . Minocin [Minocycline Hcl] Rash  . Other Rash    Optifoam applied to her left achilles wound.   . Tetracycline Rash  . Tetracyclines & Related Rash    Current Outpatient Prescriptions  Medication Sig Dispense Refill  . alendronate (FOSAMAX) 70 MG tablet Take 70 mg by mouth once a week. Take with a full glass of water on an empty stomach.--  TAKES ON SUNDAY'S    . amLODipine (NORVASC) 10 MG tablet TAKE ONE (1) TABLET EACH DAY (Patient taking differently: TAKE ONE (1) TABLET EACH DAY in evening) 90 tablet 3  . aspirin EC 81 MG tablet Take 81 mg by mouth daily. In evening    . clopidogrel (PLAVIX) 75 MG tablet Take 75 mg by mouth daily. In  evening    . fluticasone (FLONASE) 50 MCG/ACT nasal spray Place 2 sprays into both nostrils daily. (Patient taking differently: Place 2 sprays into both nostrils daily. As needed) 16 g 2  . loratadine (CLARITIN) 10 MG tablet Take 10 mg by mouth daily. As needed    . Magnesium 400 MG CAPS Take 400 mg by mouth every evening. As needed for leg cramps.    . triamcinolone cream (KENALOG) 0.1 % Apply 1 application topically 2 (two) times daily. As needed     No current facility-administered medications for this visit.    Review of Systems Review of Systems  Constitutional: Negative.   Respiratory: Negative.   Cardiovascular: Negative.     Blood pressure 120/64, pulse 74, resp. rate 14, height 5\' 4"  (1.626 m), weight 139 lb (63.05 kg). The patient reports that the blood pressure in her left arm is significantly lower than in the right. Physical Exam Physical Exam  Constitutional: She is oriented to person, place, and time. She appears well-developed and well-nourished.  Eyes: Conjunctivae are normal.  Neck: Neck supple.  Cardiovascular: Normal rate and regular rhythm.   Murmur heard.  Systolic murmur is present with a grade of 2/6  Pulmonary/Chest: Effort normal and breath sounds normal.  Abdominal: Soft. Bowel sounds are normal. There is no tenderness.  Lymphadenopathy:    She has no cervical adenopathy.  Neurological: She is alert and oriented to person, place, and time.  Skin: Skin is warm and dry.    Data Reviewed CT results.  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    Plan    Laparoscopic Cholecystectomy with Intraoperative Cholangiogram. The procedure, including it's potential risks and complications (including but not limited to infection, bleeding, injury to intra-abdominal organs or bile ducts, bile leak, poor cosmetic result, sepsis and death) were discussed with the patient in detail. Non-operative options, including their inherent risks (acute  calculous cholecystitis with possible choledocholithiasis or gallstone pancreatitis, with the risk of ascending cholangitis, sepsis, and death) were discussed as well. The patient expressed and understanding of what we discussed and wishes to proceed with laparoscopic cholecystectomy. The patient further understands that if it is technically not possible, or it is unsafe to proceed laparoscopically, that I will convert to an open cholecystectomy.      Patient to stop her PLAVIX on December 28,2016 and take aspirin daily.  [place] This information has been scribed by Gaspar Cola CMA.  PCP:  Etheleen Mayhew 05/19/2015, 9:38 PM

## 2015-05-18 NOTE — Patient Instructions (Signed)
  Your procedure is scheduled on: Wednesday Jan. 4, 2017. Report to Same Day Surgery. To find out your arrival time please call [phone] between 1PM - 3PM on Tuesday Jan. 3, 2017 .  Remember: Instructions that are not followed completely may result in serious medical risk, up to and including death, or upon the discretion of your surgeon and anesthesiologist your surgery may need to be rescheduled.    _x___ 1. Do not eat food or drink liquids after midnight. No gum chewing or hard candies.     ____ 2. No Alcohol for 24 hours before or after surgery.   ____ 3. Bring all medications with you on the day of surgery if instructed.    __x__ 4. Notify your doctor if there is any change in your medical condition     (cold, fever, infections).     Do not wear jewelry, make-up, hairpins, clips or nail polish.  Do not wear lotions, powders, or perfumes. You may wear deodorant.  Do not shave 48 hours prior to surgery. Men may shave face and neck.  Do not bring valuables to the hospital.    [facility] is not responsible for any belongings or valuables.               Contacts, dentures or bridgework may not be worn into surgery.  Leave your suitcase in the car. After surgery it may be brought to your room.  For patients admitted to the hospital, discharge time is determined by your treatment team.   Patients discharged the day of surgery will not be allowed to drive home.    Please read over the following fact sheets that you were given:   [facility] Preparing for Surgery  ____ Take these medicines the morning of surgery with A SIP OF WATER: None    ____ Fleet Enema (as directed)   __x__ Use CHG Soap as directed  ____ Use inhalers on the day of surgery  ____ Stop metformin 2 days prior to surgery    ____ Take 1/2 of usual insulin dose the night before surgery and none on the morning of surgery.   ____ Stop Plavix on May 26, 2015 as instructed by Dr. Bary Castilla  __x__ Stop  Anti-inflammatories on does not apply.  Can take Tylenol if needed for pain.   ____ Stop supplements until after surgery.    ____ Bring C-Pap to the hospital.

## 2015-05-26 ENCOUNTER — Other Ambulatory Visit: Payer: Commercial Managed Care - HMO

## 2015-06-01 ENCOUNTER — Telehealth: Payer: Self-pay

## 2015-06-01 NOTE — Telephone Encounter (Signed)
Patient's daughter, Lorriane Shire, called and states that the patient developed a cold on Sunday and wanted to reschedule her surgery scheduled for 06/02/15. Patient is now rescheduled for surgery at [facility] on 06/16/15. She is aware to stop her Plavix one week prior.

## 2015-06-02 MED ORDER — SODIUM CHLORIDE 0.9 % IJ SOLN
INTRAMUSCULAR | Status: AC
  Filled 2015-06-02: qty 50

## 2015-06-16 ENCOUNTER — Ambulatory Visit
Admission: RE | Admit: 2015-06-16 | Discharge: 2015-06-16 | Disposition: A | Discharge status: Home / Self Care | Payer: Commercial Managed Care - HMO | Source: Ambulatory Visit | Attending: General Surgery | Admitting: General Surgery

## 2015-06-16 ENCOUNTER — Ambulatory Visit: Payer: Commercial Managed Care - HMO | Admitting: *Deleted

## 2015-06-16 ENCOUNTER — Encounter
Admission: RE | Disposition: A | Discharge status: Home / Self Care | Payer: Self-pay | Source: Ambulatory Visit | Attending: General Surgery

## 2015-06-16 ENCOUNTER — Encounter: Payer: Self-pay | Admitting: *Deleted

## 2015-06-16 ENCOUNTER — Ambulatory Visit: Payer: Commercial Managed Care - HMO

## 2015-06-16 DIAGNOSIS — K219 Gastro-esophageal reflux disease without esophagitis: Secondary | ICD-10-CM | POA: Diagnosis not present

## 2015-06-16 DIAGNOSIS — M1990 Unspecified osteoarthritis, unspecified site: Secondary | ICD-10-CM | POA: Insufficient documentation

## 2015-06-16 DIAGNOSIS — Z87891 Personal history of nicotine dependence: Secondary | ICD-10-CM | POA: Diagnosis not present

## 2015-06-16 DIAGNOSIS — R828 Abnormal findings on cytological and histological examination of urine: Secondary | ICD-10-CM | POA: Diagnosis not present

## 2015-06-16 DIAGNOSIS — K8011 Calculus of gallbladder with chronic cholecystitis with obstruction: Secondary | ICD-10-CM | POA: Diagnosis not present

## 2015-06-16 DIAGNOSIS — I739 Peripheral vascular disease, unspecified: Secondary | ICD-10-CM | POA: Diagnosis not present

## 2015-06-16 DIAGNOSIS — J449 Chronic obstructive pulmonary disease, unspecified: Secondary | ICD-10-CM | POA: Diagnosis not present

## 2015-06-16 DIAGNOSIS — K828 Other specified diseases of gallbladder: Secondary | ICD-10-CM

## 2015-06-16 DIAGNOSIS — I10 Essential (primary) hypertension: Secondary | ICD-10-CM | POA: Insufficient documentation

## 2015-06-16 DIAGNOSIS — F329 Major depressive disorder, single episode, unspecified: Secondary | ICD-10-CM | POA: Diagnosis not present

## 2015-06-16 DIAGNOSIS — K8020 Calculus of gallbladder without cholecystitis without obstruction: Secondary | ICD-10-CM

## 2015-06-16 DIAGNOSIS — K8010 Calculus of gallbladder with chronic cholecystitis without obstruction: Secondary | ICD-10-CM | POA: Insufficient documentation

## 2015-06-16 DIAGNOSIS — K8044 Calculus of bile duct with chronic cholecystitis without obstruction: Secondary | ICD-10-CM | POA: Diagnosis not present

## 2015-06-16 HISTORY — PX: CHOLECYSTECTOMY: SHX55

## 2015-06-16 SURGERY — LAPAROSCOPIC CHOLECYSTECTOMY
Anesthesia: General | Wound class: Clean Contaminated

## 2015-06-16 MED ORDER — DEXAMETHASONE SODIUM PHOSPHATE 10 MG/ML IJ SOLN
8.0000 mg | Freq: Once | INTRAMUSCULAR | Status: DC | PRN
Start: 2015-06-16 — End: 2015-06-16

## 2015-06-16 MED ORDER — CEFAZOLIN SODIUM-DEXTROSE 2-3 GM-% IV SOLR: INTRAVENOUS | Status: DC | PRN

## 2015-06-16 MED ORDER — NEOSTIGMINE METHYLSULFATE 10 MG/10ML IV SOLN
INTRAVENOUS | Status: DC | PRN
  Administered 2015-06-16: 2.5 mg via INTRAVENOUS

## 2015-06-16 MED ORDER — FENTANYL CITRATE (PF) 100 MCG/2ML IJ SOLN
25.0000 ug | INTRAMUSCULAR | Status: DC | PRN
  Administered 2015-06-16: 25 ug via INTRAVENOUS

## 2015-06-16 MED ORDER — SODIUM CHLORIDE 0.9 % IJ SOLN
INTRAMUSCULAR | Status: AC
  Filled 2015-06-16: qty 50

## 2015-06-16 MED ORDER — FENTANYL CITRATE (PF) 100 MCG/2ML IJ SOLN
INTRAMUSCULAR | Status: DC | PRN
  Administered 2015-06-16 (×2): 50 ug via INTRAVENOUS

## 2015-06-16 MED ORDER — FENTANYL CITRATE (PF) 100 MCG/2ML IJ SOLN
INTRAMUSCULAR | Status: AC
  Filled 2015-06-16: qty 2

## 2015-06-16 MED ORDER — GLYCOPYRROLATE 0.2 MG/ML IJ SOLN
INTRAMUSCULAR | Status: DC | PRN
  Administered 2015-06-16: 0.6 mg via INTRAVENOUS

## 2015-06-16 MED ORDER — CEFAZOLIN SODIUM-DEXTROSE 2-3 GM-% IV SOLR
2.0000 g | INTRAVENOUS | Status: AC
  Administered 2015-06-16: 2 g via INTRAVENOUS

## 2015-06-16 MED ORDER — FAMOTIDINE 20 MG PO TABS
20.0000 mg | ORAL_TABLET | Freq: Once | ORAL | Status: AC
  Administered 2015-06-16: 20 mg via ORAL

## 2015-06-16 MED ORDER — ROCURONIUM BROMIDE 100 MG/10ML IV SOLN
INTRAVENOUS | Status: DC | PRN
  Administered 2015-06-16: 30 mg via INTRAVENOUS

## 2015-06-16 MED ORDER — HYDROCODONE-ACETAMINOPHEN 5-325 MG PO TABS
ORAL_TABLET | ORAL | Status: AC
  Administered 2015-06-16: 1
  Filled 2015-06-16: qty 1

## 2015-06-16 MED ORDER — LACTATED RINGERS IV SOLN
INTRAVENOUS | Status: DC
  Administered 2015-06-16: 12:00:00 via INTRAVENOUS
  Administered 2015-06-16: 50 mL/h via INTRAVENOUS

## 2015-06-16 MED ORDER — ACETAMINOPHEN 10 MG/ML IV SOLN
INTRAVENOUS | Status: DC | PRN
  Administered 2015-06-16: 1000 mg via INTRAVENOUS

## 2015-06-16 MED ORDER — LIDOCAINE HCL (CARDIAC) 20 MG/ML IV SOLN
INTRAVENOUS | Status: DC | PRN
  Administered 2015-06-16: 30 mg via INTRAVENOUS

## 2015-06-16 MED ORDER — ONDANSETRON HCL 4 MG/2ML IJ SOLN
INTRAMUSCULAR | Status: DC | PRN
  Administered 2015-06-16: 4 mg via INTRAVENOUS

## 2015-06-16 MED ORDER — SODIUM CHLORIDE 0.9 % IV SOLN
INTRAVENOUS | Status: DC | PRN
  Administered 2015-06-16: 15 mL

## 2015-06-16 MED ORDER — FAMOTIDINE 20 MG PO TABS
ORAL_TABLET | ORAL | Status: AC
  Administered 2015-06-16: 20 mg via ORAL
  Filled 2015-06-16: qty 1

## 2015-06-16 MED ORDER — PROPOFOL 10 MG/ML IV BOLUS
INTRAVENOUS | Status: DC | PRN
  Administered 2015-06-16: 150 mg via INTRAVENOUS

## 2015-06-16 MED ORDER — CEFAZOLIN SODIUM-DEXTROSE 2-3 GM-% IV SOLR
INTRAVENOUS | Status: DC | PRN
  Administered 2015-06-16: 2 g via INTRAVENOUS

## 2015-06-16 MED ORDER — KETOROLAC TROMETHAMINE 30 MG/ML IJ SOLN
INTRAMUSCULAR | Status: DC | PRN
  Administered 2015-06-16: 15 mg via INTRAVENOUS

## 2015-06-16 MED ORDER — HYDROCODONE-ACETAMINOPHEN 5-325 MG PO TABS: 1.0000 | ORAL_TABLET | ORAL | Status: DC | PRN

## 2015-06-16 MED ORDER — MIDAZOLAM HCL 2 MG/2ML IJ SOLN
INTRAMUSCULAR | Status: DC | PRN
  Administered 2015-06-16: 0.5 mg via INTRAVENOUS
  Administered 2015-06-16: 1 mg via INTRAVENOUS
  Administered 2015-06-16: 0.5 mg via INTRAVENOUS

## 2015-06-16 MED ORDER — ACETAMINOPHEN 10 MG/ML IV SOLN
INTRAVENOUS | Status: AC
  Filled 2015-06-16: qty 100

## 2015-06-16 MED ORDER — CEFAZOLIN SODIUM-DEXTROSE 2-3 GM-% IV SOLR
INTRAVENOUS | Status: AC
  Administered 2015-06-16: 2 g via INTRAVENOUS
  Filled 2015-06-16: qty 50

## 2015-06-16 SURGICAL SUPPLY — 41 items
APPLIER CLIP ROT 10 11.4 M/L (STAPLE) ×2
BENZOIN TINCTURE PRP APPL 2/3 (GAUZE/BANDAGES/DRESSINGS) ×2 IMPLANT
BLADE SURG 11 STRL SS SAFETY (MISCELLANEOUS) ×2 IMPLANT
CANISTER SUCT 1200ML W/VALVE (MISCELLANEOUS) ×2 IMPLANT
CANNULA DILATOR 10 W/SLV (CANNULA) ×2 IMPLANT
CANNULA DILATOR 5 W/SLV (CANNULA) ×4 IMPLANT
CATH CHOLANG 76X19 KUMAR (CATHETERS) ×2 IMPLANT
CHLORAPREP W/TINT 26ML (MISCELLANEOUS) ×2 IMPLANT
CLIP APPLIE ROT 10 11.4 M/L (STAPLE) ×1 IMPLANT
CONRAY 60ML FOR OR (MISCELLANEOUS) ×2 IMPLANT
DISSECTOR KITTNER STICK (MISCELLANEOUS) IMPLANT
DISSECTORS/KITTNER STICK (MISCELLANEOUS)
DRAPE SHEET LG 3/4 BI-LAMINATE (DRAPES) ×2 IMPLANT
DRESSING TELFA 4X3 1S ST N-ADH (GAUZE/BANDAGES/DRESSINGS) ×2 IMPLANT
DRSG TEGADERM 2-3/8X2-3/4 SM (GAUZE/BANDAGES/DRESSINGS) ×2 IMPLANT
ELECT REM PT RETURN 9FT ADLT (ELECTROSURGICAL) ×2
ELECTRODE REM PT RTRN 9FT ADLT (ELECTROSURGICAL) ×1 IMPLANT
ENDOPOUCH RETRIEVER 10 (MISCELLANEOUS) ×2 IMPLANT
GLOVE BIO SURGEON STRL SZ7.5 (GLOVE) ×6 IMPLANT
GLOVE INDICATOR 8.0 STRL GRN (GLOVE) ×6 IMPLANT
GOWN STRL REUS W/ TWL LRG LVL3 (GOWN DISPOSABLE) ×3 IMPLANT
GOWN STRL REUS W/TWL LRG LVL3 (GOWN DISPOSABLE) ×3
HEMOSTAT SURGICEL 2X3 (HEMOSTASIS) ×2 IMPLANT
IRRIGATION STRYKERFLOW (MISCELLANEOUS) ×1 IMPLANT
IRRIGATOR STRYKERFLOW (MISCELLANEOUS) ×2
IV LACTATED RINGERS 1000ML (IV SOLUTION) ×2 IMPLANT
KIT RM TURNOVER STRD PROC AR (KITS) ×2 IMPLANT
LABEL OR SOLS (LABEL) ×2 IMPLANT
NDL INSUFF ACCESS 14 VERSASTEP (NEEDLE) ×2 IMPLANT
NS IRRIG 500ML POUR BTL (IV SOLUTION) ×2 IMPLANT
PACK LAP CHOLECYSTECTOMY (MISCELLANEOUS) ×2 IMPLANT
SCISSORS METZENBAUM CVD 33 (INSTRUMENTS) ×2 IMPLANT
SEAL FOR SCOPE WARMER C3101 (MISCELLANEOUS) IMPLANT
STRIP CLOSURE SKIN 1/2X4 (GAUZE/BANDAGES/DRESSINGS) ×2 IMPLANT
SUT MAXON ABS #0 GS21 30IN (SUTURE) ×2 IMPLANT
SUT VIC AB 0 CT2 27 (SUTURE) ×4 IMPLANT
SUT VIC AB 4-0 FS2 27 (SUTURE) ×2 IMPLANT
SWABSTK COMLB BENZOIN TINCTURE (MISCELLANEOUS) ×2 IMPLANT
TROCAR XCEL NON-BLD 11X100MML (ENDOMECHANICALS) ×2 IMPLANT
TUBING INSUFFLATOR HI FLOW (MISCELLANEOUS) ×2 IMPLANT
WATER STERILE IRR 1000ML POUR (IV SOLUTION) ×2 IMPLANT

## 2015-06-16 NOTE — H&P (Signed)
This patient was identified with changes consistent with "porcelain gallbladder" on CT imaging for peripheral vascular disease. She is at increased risk of malignancy in the gallbladder, and is scheduled for elective cholecystectomy.  No change in cardiopulmonary exam since her last visit.  Plans for cholecystectomy were reviewed.

## 2015-06-16 NOTE — Anesthesia Postprocedure Evaluation (Signed)
Anesthesia Post Note  Patient: Tracey Rogers  Procedure(s) Performed: Procedure(s) (LRB): LAPAROSCOPIC CHOLECYSTECTOMY WITH CHOLANGIOGRAM (N/A)  Patient location during evaluation: PACU Anesthesia Type: General Level of consciousness: awake Pain management: pain level controlled Vital Signs Assessment: post-procedure vital signs reviewed and stable Respiratory status: spontaneous breathing Cardiovascular status: blood pressure returned to baseline Postop Assessment: no headache Anesthetic complications: no    Last Vitals:  Filed Vitals:   06/16/15 1109 06/16/15 1320  BP: 137/79 140/71  Pulse: 80 92  Temp: 35.7 C 36.6 C  Resp: 16 21    Last Pain:  Filed Vitals:   06/16/15 1322  PainSc: 0-No pain                 Ximena Todaro M

## 2015-06-16 NOTE — Anesthesia Preprocedure Evaluation (Addendum)
Anesthesia Evaluation  Patient identified by MRN, date of birth, ID band Patient awake    Reviewed: Allergy & Precautions, NPO status , Patient's Chart, lab work & pertinent test results  History of Anesthesia Complications (+) PROLONGED EMERGENCE  Airway Mallampati: I  TM Distance: >3 FB Neck ROM: Limited    Dental  (+) Edentulous Upper, Edentulous Lower   Pulmonary COPD,  COPD inhaler, former smoker,    Pulmonary exam normal        Cardiovascular Exercise Tolerance: Poor hypertension, Pt. on medications + Peripheral Vascular Disease  Normal cardiovascular exam     Neuro/Psych Depression    GI/Hepatic GERD  Medicated and Controlled,  Endo/Other    Renal/GU      Musculoskeletal  (+) Arthritis , Osteoarthritis,    Abdominal (+)  Abdomen: soft.    Peds  Hematology   Anesthesia Other Findings   Reproductive/Obstetrics                            Anesthesia Physical Anesthesia Plan  ASA: III  Anesthesia Plan: General   Post-op Pain Management:    Induction: Intravenous  Airway Management Planned: Oral ETT  Additional Equipment:   Intra-op Plan:   Post-operative Plan: Extubation in OR  Informed Consent: I have reviewed the patients History and Physical, chart, labs and discussed the procedure including the risks, benefits and alternatives for the proposed anesthesia with the patient or authorized representative who has indicated his/her understanding and acceptance.     Plan Discussed with: CRNA  Anesthesia Plan Comments: (Avoid lines in left arm (vascular compromise) and use short acting agents--was slow to wake up and somewhat weak after multiple surgeries last year. Recently quit smoking.)        Anesthesia Quick Evaluation

## 2015-06-16 NOTE — Op Note (Signed)
Preoperative diagnosis: Porcelain gallbladder, cholelithiasis.  Postoperative diagnosis: Same.  Operative procedure: Laparoscopic cholecystectomy with intraoperative cholangiograms.  Operating surgeon: Loa Socks, M.D.  Anesthesia: Gen. endotracheal.  Estimated blood loss: Less than 5 cc.  Clinical note: This 70 year old woman was noted to have evidence of cholelithiasis and calcification of the gallbladder and a CT scan obtained for evaluation of lower extremity ischemia. She has an increased risk of gallbladder cancer with the identification of both gallbladder wall calcifications and cholelithiasis of approximately 3%. While she has not had any symptoms attributable to her gallbladder she was felt to be candidate for elective cholecystectomy.  Operative note: With the patient under adequate general endotracheal anesthesia the abdomen was prepped with ChloraPrep and draped. She was placed in Trendelenburg position. A varies needle was placed through a trans-umbilical incision. After assuring intra-abdominal location with the hanging drop test, the abdomen was insufflated with CO2 a 10 mmHg pressure. A 10 mm step port was expanded and inspection showed no evidence of injury from initial port placement. The patient was placed in reverse Trendelenburg position and rolled to left. An 11 mm XL port was placed in the epigastrium to the right of the falciform ligament. 25 mm Step ports were then placed laterally on the right side of the abdomen under direct vision. The gallbladder showed chronic thickening and was tensely distended. This was decompressed with a needle catheter to allow grasping forceps tube placed to gallbladder on cephalad traction. The neck of the gallbladder was clear. There was a stone impacted in the neck of the gallbladder that could not be dislodged. The cystic duct was mobilized and a Kumar clamp placed. Cholangiograms are completed using 15 cc of one half strength Conray 60  with the catheter tip placed at the junction of the cystic duct and the gallbladder body. This showed free flow into the right and left hepatic ducts and free flow into the duodenum. No evidence of retained stones. The cystic duct, cystic vein and branches of the cystic artery were then doubly clipped and divided. The gallbladder was carefully removed from the liver bed making sure for cautery dissection. The wall was not file it appeared it was placed into an Endo Catch bag and delivered to the umbilical port site. This incision was expanded to allow extraction of the thickened gallbladder wall. The stone impacted in the gallbladder was calcified. The gallbladder wall was glistening without nodularity.  After reestablishing pneumoperitoneum the right upper quadrant was irrigated with lactated Ringer solution. A place of Surgicel that had been put on the gallbladder bed was removed. Good hemostasis was noted. Clips were all intact. Right upper quadrant was irrigated with lactated Ringer's solution and the abdomen desufflated and ports removed under direct vision.  The fascia at the umbilicus was closed with 2-0 PDS figure-of-eight sutures. Skin incisions were closed with 4-0 Vicryl subcuticular sutures. Benzoin, Steri-Strips, Telfa and Tegaderm dressings were applied.  The patient tolerated the procedure well and was taken recovery room in stable condition.

## 2015-06-16 NOTE — Transfer of Care (Signed)
Immediate Anesthesia Transfer of Care Note  Patient: Tracey Rogers  Procedure(s) Performed: Procedure(s): LAPAROSCOPIC CHOLECYSTECTOMY WITH CHOLANGIOGRAM (N/A)  Patient Location: PACU  Anesthesia Type:General  Level of Consciousness: awake, oriented and patient cooperative  Airway & Oxygen Therapy: Patient Spontanous Breathing and Patient connected to face mask oxygen  Post-op Assessment: Post -op Vital signs reviewed and stable  Post vital signs: Reviewed and stable  Last Vitals:  Filed Vitals:   06/16/15 1109  BP: 137/79  Pulse: 80  Temp: 35.7 C  Resp: 16    Complications: No apparent anesthesia complications

## 2015-06-16 NOTE — Anesthesia Procedure Notes (Signed)
Procedure Name: Intubation Date/Time: 06/16/2015 12:55 PM Performed by: Courtney Paris Pre-anesthesia Checklist: Patient identified, Emergency Drugs available, Suction available, Patient being monitored and Timeout performed Patient Re-evaluated:Patient Re-evaluated prior to inductionOxygen Delivery Method: Circle system utilized Preoxygenation: Pre-oxygenation with 100% oxygen Intubation Type: Combination inhalational/ intravenous induction and IV induction Ventilation: Mask ventilation without difficulty Laryngoscope Size: Miller and 3 Grade View: Grade II Tube type: Oral Tube size: 7.0 mm Number of attempts: 1 Airway Equipment and Method: Stylet Placement Confirmation: ETT inserted through vocal cords under direct vision,  positive ETCO2,  CO2 detector and breath sounds checked- equal and bilateral Secured at: 20 cm Dental Injury: Teeth and Oropharynx as per pre-operative assessment

## 2015-06-18 LAB — SURGICAL PATHOLOGY

## 2015-06-22 ENCOUNTER — Telehealth: Payer: Self-pay | Admitting: *Deleted

## 2015-06-22 NOTE — Telephone Encounter (Signed)
Notified patient as instructed, patient pleased. Discussed follow-up appointments, patient agrees  

## 2015-06-22 NOTE — Telephone Encounter (Signed)
-----   Message from Robert Bellow, MD sent at 06/22/2015  9:58 AM EST ----- Please notify the patient if there was no cancer in the gallbladder. Follow-up as scheduled. ----- Message -----    From: [facility]    Sent: 06/18/2015   2:24 PM      To: Robert Bellow, MD

## 2015-06-29 ENCOUNTER — Encounter: Payer: Self-pay | Admitting: General Surgery

## 2015-06-29 ENCOUNTER — Ambulatory Visit (INDEPENDENT_AMBULATORY_CARE_PROVIDER_SITE_OTHER): Payer: Commercial Managed Care - HMO | Admitting: General Surgery

## 2015-06-29 VITALS — BP 118/60 | HR 64 | Resp 12 | Ht 64.0 in | Wt 140.0 lb

## 2015-06-29 DIAGNOSIS — K828 Other specified diseases of gallbladder: Secondary | ICD-10-CM

## 2015-06-29 NOTE — Progress Notes (Signed)
Patient ID: Tracey Rogers, female   DOB: 1946/01/15, 70 y.o.   MRN: [id number]  Chief Complaint  Patient presents with  . Routine Post Op    gallbladder    HPI Tracey Rogers is a 70 y.o. female here today for her post op gallbladder surgery done on 06/16/15. Patient states she is doing well still a little sore but better.  Most of the patient's discomfort is located around the umbilical port site. She reports daily soft bowel movements. No dietary intolerance.  I personally reviewed the patient's history.   HPI  Past Medical History  Diagnosis Date  . Hypertension   . Ulcer of left ankle ([place])   . Peripheral vascular disease ([place])   . Constipation   . Arthritis   . Psoriasis   . Skin ulcer of right great toe ([place])   . Wears dentures   . GERD (gastroesophageal reflux disease)   . Complication of anesthesia     gets weak after surgery  . Family history of adverse reaction to anesthesia     son gets postop nausea and vomiting, weak  . Heart murmur   . COPD (chronic obstructive pulmonary disease) (H[facility])     mild    Past Surgical History  Procedure Laterality Date  . Cataract extraction w/ intraocular lens  implant, bilateral  2015  . Endovascular stent insertion  sept  &  oct  2015    LEFT LEG STENTING--  common and superficial femoral artery  . Cardiovascular stress test  03-03-2014  dr Saralyn Pilar    normal lexi scan sestamibi study/  normal LVF without evidence for significant scar or ischemia  . Transthoracic echocardiogram  03-03-2014    normal LVF/  ef 55-60%/  mild TI and MI  . Incision and drainage of wound Bilateral 04/13/2014    Procedure: IRRIGATION AND DEBRIDEMENT OF LEFT ANKLE WOUND AND RIGHT FOOT;  Surgeon: Theodoro Kos, DO;  Location: [place];  Service: Plastics;  Laterality: Bilateral;  . Application of a-cell of extremity Left 04/13/2014    Procedure: PLACEMENT OF APPLICATION OF A-CELL ;  Surgeon: Theodoro Kos, DO;   Location: [place];  Service: Plastics;  Laterality: Left;  . I&d extremity Left 09/10/2014    Procedure: IRRIGATION AND DEBRIDEMENT LEFT ANKLE WOUND SURGICAL PREP ;  Surgeon: Theodoro Kos, DO;  Location: [place];  Service: Plastics;  Laterality: Left;  . Application of a-cell of extremity Left 09/10/2014    Procedure: APPLICATION OF A-CELL OF EXTREMITY;  Surgeon: Theodoro Kos, DO;  Location: [place];  Service: Plastics;  Laterality: Left;  . Endovascular stent insertion  May 27, 2014    LifeStent bare metal stent left SFA  . Cholecystectomy N/A 06/16/2015    Procedure: LAPAROSCOPIC CHOLECYSTECTOMY WITH CHOLANGIOGRAM;  Surgeon: Robert Bellow, MD;  Location: [facility];  Service: General;  Laterality: N/A;    Family History  Problem Relation Age of Onset  . Breast cancer Maternal Aunt 60  . Kidney disease Mother   . Hypertension Mother   . Heart disease Mother   . Heart disease Sister   . Stroke Sister     Social History Social History  Substance Use Topics  . Smoking status: Former Smoker -- 0.50 packs/day for 42 years    Types: Cigarettes    Quit date: 12/24/2013  . Smokeless tobacco: Former Systems developer     Comment: no smokers in her home  . Alcohol Use: No  Allergies  Allergen Reactions  . Clarithromycin Rash    "Mycin"  . Methocarbamol Rash  . Minocin [Minocycline Hcl] Rash  . Other Rash    Optifoam applied to her left achilles wound.   . Tetracycline Rash  . Tetracyclines & Related Rash    Current Outpatient Prescriptions  Medication Sig Dispense Refill  . acetaminophen (TYLENOL) 325 MG tablet Take 650 mg by mouth every 6 (six) hours as needed.    Marland Kitchen alendronate (FOSAMAX) 70 MG tablet Take 70 mg by mouth once a week. Take with a full glass of water on an empty stomach.--  TAKES ON SUNDAY'S    . amLODipine (NORVASC) 10 MG tablet TAKE ONE (1) TABLET EACH DAY (Patient taking differently: TAKE ONE (1) TABLET EACH  DAY in evening) 90 tablet 3  . aspirin EC 81 MG tablet Take 81 mg by mouth daily. In evening    . clopidogrel (PLAVIX) 75 MG tablet Take 75 mg by mouth daily.    . fluticasone (FLONASE) 50 MCG/ACT nasal spray Place 2 sprays into both nostrils daily. (Patient taking differently: Place 2 sprays into both nostrils daily. As needed) 16 g 2  . loratadine (CLARITIN) 10 MG tablet Take 10 mg by mouth daily. As needed    . Magnesium 400 MG CAPS Take 400 mg by mouth every evening. As needed for leg cramps.    . triamcinolone cream (KENALOG) 0.1 % Apply 1 application topically 2 (two) times daily. As needed     No current facility-administered medications for this visit.    Review of Systems Review of Systems  Constitutional: Negative.   Respiratory: Negative.   Cardiovascular: Negative.     Blood pressure 118/60, pulse 64, resp. rate 12, height 5\' 4"  (1.626 m), weight 140 lb (63.504 kg).  Physical Exam Physical Exam  Constitutional: She is oriented to person, place, and time. She appears well-developed and well-nourished.  Abdominal: Soft. Normal appearance and bowel sounds are normal.  Port sites are clean and healing well.   Neurological: She is alert and oriented to person, place, and time.  Skin: Skin is warm and dry.    Data Reviewed DIAGNOSIS:  A. GALLBLADDER; CHOLECYSTECTOMY:  - CHOLELITHIASIS AND HYALINIZING CHOLECYSTITIS WITH EXTENSIVE  CALCIFICATION.  - BENIGN LYMPH NODE.  - NEGATIVE FOR MALIGNANCY.   Note:  Multiple additional sections were submitted for microscopic evaluation.   Assessment    Doing well status post cholecystectomy.    Plan       Patient to return as needed PCP:  Margarita Rana This information has been scribed by Gaspar Cola CMA.   Robert Bellow 06/29/2015, 8:15 PM

## 2015-06-29 NOTE — Patient Instructions (Signed)
Patient to return as needed. 

## 2015-07-12 ENCOUNTER — Other Ambulatory Visit: Payer: Self-pay | Admitting: Family Medicine

## 2015-07-12 DIAGNOSIS — M810 Age-related osteoporosis without current pathological fracture: Secondary | ICD-10-CM

## 2015-07-27 DIAGNOSIS — Z961 Presence of intraocular lens: Secondary | ICD-10-CM | POA: Diagnosis not present

## 2015-08-05 ENCOUNTER — Telehealth: Payer: Self-pay | Admitting: Family Medicine

## 2015-08-05 NOTE — Telephone Encounter (Signed)
Please review. Thanks!  

## 2015-08-05 NOTE — Telephone Encounter (Signed)
Order not needed.Pt has scheduled appointment

## 2015-08-05 NOTE — Telephone Encounter (Signed)
Otila Kluver from [facility] called requesting referral for pt to see Dr Delana Meyer for PVD.Pt has not been in office for a while.Do you want to approve this ? She has an office visit scheduled for 08/09/15

## 2015-08-05 NOTE — Telephone Encounter (Signed)
Ok to approve    Thanks

## 2015-08-05 NOTE — Telephone Encounter (Signed)
Sarah, do I need to order this? Please advise. Thanks!

## 2015-08-09 DIAGNOSIS — L97509 Non-pressure chronic ulcer of other part of unspecified foot with unspecified severity: Secondary | ICD-10-CM | POA: Diagnosis not present

## 2015-08-09 DIAGNOSIS — E785 Hyperlipidemia, unspecified: Secondary | ICD-10-CM | POA: Diagnosis not present

## 2015-08-09 DIAGNOSIS — L97309 Non-pressure chronic ulcer of unspecified ankle with unspecified severity: Secondary | ICD-10-CM | POA: Diagnosis not present

## 2015-08-09 DIAGNOSIS — I10 Essential (primary) hypertension: Secondary | ICD-10-CM | POA: Diagnosis not present

## 2015-08-09 DIAGNOSIS — I6523 Occlusion and stenosis of bilateral carotid arteries: Secondary | ICD-10-CM | POA: Diagnosis not present

## 2015-08-09 DIAGNOSIS — I70299 Other atherosclerosis of native arteries of extremities, unspecified extremity: Secondary | ICD-10-CM | POA: Diagnosis not present

## 2015-08-09 DIAGNOSIS — I998 Other disorder of circulatory system: Secondary | ICD-10-CM | POA: Diagnosis not present

## 2015-08-09 DIAGNOSIS — I739 Peripheral vascular disease, unspecified: Secondary | ICD-10-CM | POA: Diagnosis not present

## 2015-08-09 DIAGNOSIS — L97409 Non-pressure chronic ulcer of unspecified heel and midfoot with unspecified severity: Secondary | ICD-10-CM | POA: Diagnosis not present

## 2015-09-02 ENCOUNTER — Other Ambulatory Visit: Payer: Self-pay | Admitting: Family Medicine

## 2015-10-04 ENCOUNTER — Encounter: Payer: Self-pay | Admitting: General Surgery

## 2015-10-04 ENCOUNTER — Ambulatory Visit (INDEPENDENT_AMBULATORY_CARE_PROVIDER_SITE_OTHER): Payer: Commercial Managed Care - HMO | Admitting: General Surgery

## 2015-10-04 VITALS — BP 126/68 | HR 70 | Resp 14 | Ht 64.0 in | Wt 150.0 lb

## 2015-10-04 DIAGNOSIS — K439 Ventral hernia without obstruction or gangrene: Secondary | ICD-10-CM

## 2015-10-04 NOTE — Patient Instructions (Addendum)
Hernia, Adult A hernia is the bulging of an organ or tissue through a weak spot in the muscles of the abdomen (abdominal wall). Hernias develop most often near the navel or groin. There are many kinds of hernias. Common kinds include:  Femoral hernia. This kind of hernia develops under the groin in the upper thigh area.  Inguinal hernia. This kind of hernia develops in the groin or scrotum.  Umbilical hernia. This kind of hernia develops near the navel.  Hiatal hernia. This kind of hernia causes part of the stomach to be pushed up into the chest.  Incisional hernia. This kind of hernia bulges through a scar from an abdominal surgery. CAUSES This condition may be caused by:  Heavy lifting.  Coughing over a long period of time.  Straining to have a bowel movement.  An incision made during an abdominal surgery.  A birth defect (congenital defect).  Excess weight or obesity.  Smoking.  Poor nutrition.  Cystic fibrosis.  Excess fluid in the abdomen.  Undescended testicles. SYMPTOMS Symptoms of a hernia include:  A lump on the abdomen. This is the first sign of a hernia. The lump may become more obvious with standing, straining, or coughing. It may get bigger over time if it is not treated or if the condition causing it is not treated.  Pain. A hernia is usually painless, but it may become painful over time if treatment is delayed. The pain is usually dull and may get worse with standing or lifting heavy objects. Sometimes a hernia gets tightly squeezed in the weak spot (strangulated) or stuck there (incarcerated) and causes additional symptoms. These symptoms may include:  Vomiting.  Nausea.  Constipation.  Irritability. DIAGNOSIS A hernia may be diagnosed with:  A physical exam. During the exam your health care provider may ask you to cough or to make a specific movement, because a hernia is usually more visible when you move.  Imaging tests. These can  include:  X-rays.  Ultrasound.  CT scan. TREATMENT A hernia that is small and painless may not need to be treated. A hernia that is large or painful may be treated with surgery. Inguinal hernias may be treated with surgery to prevent incarceration or strangulation. Strangulated hernias are always treated with surgery, because lack of blood to the trapped organ or tissue can cause it to die. Surgery to treat a hernia involves pushing the bulge back into place and repairing the weak part of the abdomen. HOME CARE INSTRUCTIONS  Avoid straining.  Do not lift anything heavier than 10 lb (4.5 kg).  Lift with your leg muscles, not your back muscles. This helps avoid strain.  When coughing, try to cough gently.  Prevent constipation. Constipation leads to straining with bowel movements, which can make a hernia worse or cause a hernia repair to break down. You can prevent constipation by:  Eating a high-fiber diet that includes plenty of fruits and vegetables.  Drinking enough fluids to keep your urine clear or pale yellow. Aim to drink 6-8 glasses of water per day.  Using a stool softener as directed by your health care provider.  Lose weight, if you are overweight.  Do not use any tobacco products, including cigarettes, chewing tobacco, or electronic cigarettes. If you need help quitting, ask your health care provider.  Keep all follow-up visits as directed by your health care provider. This is important. Your health care provider may need to monitor your condition. SEEK MEDICAL CARE IF:  You have   swelling, redness, and pain in the affected area.  Your bowel habits change. SEEK IMMEDIATE MEDICAL CARE IF:  You have a fever.  You have abdominal pain that is getting worse.  You feel nauseous or you vomit.  You cannot push the hernia back in place by gently pressing on it while you are lying down.  The hernia:  Changes in shape or size.  Is stuck outside the  abdomen.  Becomes discolored.  Feels hard or tender.   This information is not intended to replace advice given to you by your health care provider. Make sure you discuss any questions you have with your health care provider.   Document Released: 05/15/2005 Document Revised: 06/05/2014 Document Reviewed: 03/25/2014 Elsevier Interactive Patient Education 2016 [employer].  Patient's surgery has been scheduled for 10-27-15 at [facility]. This patient has been asked to stop Plavix 7 days prior to procedure. It is okay for patient to continue 81 mg aspirin once daily.

## 2015-10-04 NOTE — Progress Notes (Signed)
Patient ID: Tracey Rogers, female   DOB: Nov 16, 1945, 70 y.o.   MRN: [id number]  Chief Complaint  Patient presents with  . Other    Lump near gallbladder incision    HPI Tracey Rogers is a 70 y.o. female here today for an evaltuion of a lump located near incision of gallbladder surgery done on 06/16/15. She noticed a lump described size of a golf ball when she was getting out of the shower one week ago. Denies pain/tenderness. No dietary or bowel issues.   HPI  Past Medical History  Diagnosis Date  . Hypertension   . Ulcer of left ankle ([place])   . Peripheral vascular disease ([place])   . Constipation   . Arthritis   . Psoriasis   . Skin ulcer of right great toe ([place])   . Wears dentures   . GERD (gastroesophageal reflux disease)   . Complication of anesthesia     gets weak after surgery  . Family history of adverse reaction to anesthesia     son gets postop nausea and vomiting, weak  . Heart murmur   . COPD (chronic obstructive pulmonary disease) ([facility])     mild    Past Surgical History  Procedure Laterality Date  . Cataract extraction w/ intraocular lens  implant, bilateral  2015  . Endovascular stent insertion  sept  &  oct  2015    LEFT LEG STENTING--  common and superficial femoral artery  . Cardiovascular stress test  03-03-2014  dr Saralyn Pilar    normal lexi scan sestamibi study/  normal LVF without evidence for significant scar or ischemia  . Transthoracic echocardiogram  03-03-2014    normal LVF/  ef 55-60%/  mild TI and MI  . Incision and drainage of wound Bilateral 04/13/2014    Procedure: IRRIGATION AND DEBRIDEMENT OF LEFT ANKLE WOUND AND RIGHT FOOT;  Surgeon: Theodoro Kos, DO;  Location: [place];  Service: Plastics;  Laterality: Bilateral;  . Application of a-cell of extremity Left 04/13/2014    Procedure: PLACEMENT OF APPLICATION OF A-CELL ;  Surgeon: Theodoro Kos, DO;  Location: [place];  Service: Plastics;   Laterality: Left;  . I&d extremity Left 09/10/2014    Procedure: IRRIGATION AND DEBRIDEMENT LEFT ANKLE WOUND SURGICAL PREP ;  Surgeon: Theodoro Kos, DO;  Location: [place];  Service: Plastics;  Laterality: Left;  . Application of a-cell of extremity Left 09/10/2014    Procedure: APPLICATION OF A-CELL OF EXTREMITY;  Surgeon: Theodoro Kos, DO;  Location: [place];  Service: Plastics;  Laterality: Left;  . Endovascular stent insertion  May 27, 2014    LifeStent bare metal stent left SFA  . Cholecystectomy N/A 06/16/2015    Procedure: LAPAROSCOPIC CHOLECYSTECTOMY WITH CHOLANGIOGRAM;  Surgeon: Robert Bellow, MD;  Location: [facility];  Service: General;  Laterality: N/A;    Family History  Problem Relation Age of Onset  . Breast cancer Maternal Aunt 60  . Kidney disease Mother   . Hypertension Mother   . Heart disease Mother   . Heart disease Sister   . Stroke Sister     Social History Social History  Substance Use Topics  . Smoking status: Former Smoker -- 0.50 packs/day for 42 years    Types: Cigarettes    Quit date: 12/24/2013  . Smokeless tobacco: Former Systems developer     Comment: no smokers in her home  . Alcohol Use: No    Allergies  Allergen  Reactions  . Clarithromycin Rash    "Mycin"  . Methocarbamol Rash  . Minocin [Minocycline Hcl] Rash  . Other Rash    Optifoam applied to her left achilles wound.   . Tetracycline Rash  . Tetracyclines & Related Rash    Current Outpatient Prescriptions  Medication Sig Dispense Refill  . acetaminophen (TYLENOL) 325 MG tablet Take 650 mg by mouth every 6 (six) hours as needed.    Marland Kitchen alendronate (FOSAMAX) 70 MG tablet TAKE ONE TABLET EVERY MORNING AT LEAST 30 MINUTES BEFORE FOOD OR BEVERAGE WITH A LITTLE WATER 12 tablet 3  . amLODipine (NORVASC) 10 MG tablet TAKE ONE (1) TABLET EACH DAY (Patient taking differently: TAKE ONE (1) TABLET EACH DAY in evening) 90 tablet 3  . aspirin EC 81 MG tablet Take 81  mg by mouth daily. In evening    . clopidogrel (PLAVIX) 75 MG tablet TAKE ONE (1) TABLET EACH DAY 90 tablet 1  . fluticasone (FLONASE) 50 MCG/ACT nasal spray Place 2 sprays into both nostrils daily. (Patient taking differently: Place 2 sprays into both nostrils daily. As needed) 16 g 2  . loratadine (CLARITIN) 10 MG tablet Take 10 mg by mouth daily. As needed    . Magnesium 400 MG CAPS Take 400 mg by mouth every evening. As needed for leg cramps.    . triamcinolone cream (KENALOG) 0.1 % Apply 1 application topically 2 (two) times daily. As needed     No current facility-administered medications for this visit.    Review of Systems Review of Systems  Constitutional: Negative.   Respiratory: Negative.   Cardiovascular: Negative.     Blood pressure 126/68, pulse 70, resp. rate 14, height 5\' 4"  (1.626 m), weight 150 lb (68.04 kg).  Physical Exam Physical Exam  Constitutional: She is oriented to person, place, and time. She appears well-developed and well-nourished.  Eyes: Conjunctivae are normal. No scleral icterus.  Neck: Neck supple.  Cardiovascular: Normal rate and regular rhythm.   Murmur heard. Pulmonary/Chest: Effort normal and breath sounds normal.  Abdominal: Soft. Bowel sounds are normal. A hernia (above gallbladder incision) is present.  Lymphadenopathy:    She has no cervical adenopathy.  Neurological: She is alert and oriented to person, place, and time.  Skin: Skin is warm and dry.    Data Reviewed Review of the operative report shows that the fascial incision was extended cephalad to allow extraction of the chronically thickened gallbladder. The fascia had been closed with interrupted 2-0 PDS figure-of-eight sutures.  Assessment    Ventral hernia post cholecystectomy.    Plan    Indication for repair were reviewed.       Hernia precautions and incarceration were discussed with the patient. If they develop symptoms of an incarcerated hernia, they were  encouraged to seek prompt medical attention. I have recommended repair of the hernia, possibly using mesh, on an outpatient basis in the near future. The risk of infection was reviewed. The role of prosthetic mesh to minimize the risk of recurrence was reviewed.  Patient's surgery has been scheduled for 10-27-15 at [facility]. This patient has been asked to stop Plavix 7 days prior to procedure. It is okay for patient to continue 81 mg aspirin once daily.     This information has been scribed by Verlene Mayer, CMA    PCP: Dr. Illa Level, [place] 10/05/2015, 4:40 PM

## 2015-10-05 DIAGNOSIS — K439 Ventral hernia without obstruction or gangrene: Secondary | ICD-10-CM | POA: Insufficient documentation

## 2015-10-05 NOTE — H&P (Signed)
Patient ID: Tracey Rogers, female   DOB: Nov 16, 1945, 70 y.o.   MRN: [id number]  Chief Complaint  Patient presents with  . Other    Lump near gallbladder incision    HPI Tracey Rogers is a 70 y.o. female here today for an evaltuion of a lump located near incision of gallbladder surgery done on 06/16/15. She noticed a lump described size of a golf ball when she was getting out of the shower one week ago. Denies pain/tenderness. No dietary or bowel issues.   HPI  Past Medical History  Diagnosis Date  . Hypertension   . Ulcer of left ankle ([place])   . Peripheral vascular disease ([place])   . Constipation   . Arthritis   . Psoriasis   . Skin ulcer of right great toe ([place])   . Wears dentures   . GERD (gastroesophageal reflux disease)   . Complication of anesthesia     gets weak after surgery  . Family history of adverse reaction to anesthesia     son gets postop nausea and vomiting, weak  . Heart murmur   . COPD (chronic obstructive pulmonary disease) ([facility])     mild    Past Surgical History  Procedure Laterality Date  . Cataract extraction w/ intraocular lens  implant, bilateral  2015  . Endovascular stent insertion  sept  &  oct  2015    LEFT LEG STENTING--  common and superficial femoral artery  . Cardiovascular stress test  03-03-2014  dr Saralyn Pilar    normal lexi scan sestamibi study/  normal LVF without evidence for significant scar or ischemia  . Transthoracic echocardiogram  03-03-2014    normal LVF/  ef 55-60%/  mild TI and MI  . Incision and drainage of wound Bilateral 04/13/2014    Procedure: IRRIGATION AND DEBRIDEMENT OF LEFT ANKLE WOUND AND RIGHT FOOT;  Surgeon: Theodoro Kos, DO;  Location: [place];  Service: Plastics;  Laterality: Bilateral;  . Application of a-cell of extremity Left 04/13/2014    Procedure: PLACEMENT OF APPLICATION OF A-CELL ;  Surgeon: Theodoro Kos, DO;  Location: [place];  Service: Plastics;   Laterality: Left;  . I&d extremity Left 09/10/2014    Procedure: IRRIGATION AND DEBRIDEMENT LEFT ANKLE WOUND SURGICAL PREP ;  Surgeon: Theodoro Kos, DO;  Location: [place];  Service: Plastics;  Laterality: Left;  . Application of a-cell of extremity Left 09/10/2014    Procedure: APPLICATION OF A-CELL OF EXTREMITY;  Surgeon: Theodoro Kos, DO;  Location: [place];  Service: Plastics;  Laterality: Left;  . Endovascular stent insertion  May 27, 2014    LifeStent bare metal stent left SFA  . Cholecystectomy N/A 06/16/2015    Procedure: LAPAROSCOPIC CHOLECYSTECTOMY WITH CHOLANGIOGRAM;  Surgeon: Robert Bellow, MD;  Location: [facility];  Service: General;  Laterality: N/A;    Family History  Problem Relation Age of Onset  . Breast cancer Maternal Aunt 60  . Kidney disease Mother   . Hypertension Mother   . Heart disease Mother   . Heart disease Sister   . Stroke Sister     Social History Social History  Substance Use Topics  . Smoking status: Former Smoker -- 0.50 packs/day for 42 years    Types: Cigarettes    Quit date: 12/24/2013  . Smokeless tobacco: Former Systems developer     Comment: no smokers in her home  . Alcohol Use: No    Allergies  Allergen  Reactions  . Clarithromycin Rash    "Mycin"  . Methocarbamol Rash  . Minocin [Minocycline Hcl] Rash  . Other Rash    Optifoam applied to her left achilles wound.   . Tetracycline Rash  . Tetracyclines & Related Rash    Current Outpatient Prescriptions  Medication Sig Dispense Refill  . acetaminophen (TYLENOL) 325 MG tablet Take 650 mg by mouth every 6 (six) hours as needed.    Marland Kitchen alendronate (FOSAMAX) 70 MG tablet TAKE ONE TABLET EVERY MORNING AT LEAST 30 MINUTES BEFORE FOOD OR BEVERAGE WITH A LITTLE WATER 12 tablet 3  . amLODipine (NORVASC) 10 MG tablet TAKE ONE (1) TABLET EACH DAY (Patient taking differently: TAKE ONE (1) TABLET EACH DAY in evening) 90 tablet 3  . aspirin EC 81 MG tablet Take 81  mg by mouth daily. In evening    . clopidogrel (PLAVIX) 75 MG tablet TAKE ONE (1) TABLET EACH DAY 90 tablet 1  . fluticasone (FLONASE) 50 MCG/ACT nasal spray Place 2 sprays into both nostrils daily. (Patient taking differently: Place 2 sprays into both nostrils daily. As needed) 16 g 2  . loratadine (CLARITIN) 10 MG tablet Take 10 mg by mouth daily. As needed    . Magnesium 400 MG CAPS Take 400 mg by mouth every evening. As needed for leg cramps.    . triamcinolone cream (KENALOG) 0.1 % Apply 1 application topically 2 (two) times daily. As needed     No current facility-administered medications for this visit.    Review of Systems Review of Systems  Constitutional: Negative.   Respiratory: Negative.   Cardiovascular: Negative.     Blood pressure 126/68, pulse 70, resp. rate 14, height 5\' 4"  (1.626 m), weight 150 lb (68.04 kg).  Physical Exam Physical Exam  Constitutional: She is oriented to person, place, and time. She appears well-developed and well-nourished.  Eyes: Conjunctivae are normal. No scleral icterus.  Neck: Neck supple.  Cardiovascular: Normal rate and regular rhythm.   Murmur heard. Pulmonary/Chest: Effort normal and breath sounds normal.  Abdominal: Soft. Bowel sounds are normal. A hernia (above gallbladder incision) is present.  Lymphadenopathy:    She has no cervical adenopathy.  Neurological: She is alert and oriented to person, place, and time.  Skin: Skin is warm and dry.    Data Reviewed Review of the operative report shows that the fascial incision was extended cephalad to allow extraction of the chronically thickened gallbladder. The fascia had been closed with interrupted 2-0 PDS figure-of-eight sutures.  Assessment    Ventral hernia post cholecystectomy.    Plan    Indication for repair were reviewed.       Hernia precautions and incarceration were discussed with the patient. If they develop symptoms of an incarcerated hernia, they were  encouraged to seek prompt medical attention. I have recommended repair of the hernia, possibly using mesh, on an outpatient basis in the near future. The risk of infection was reviewed. The role of prosthetic mesh to minimize the risk of recurrence was reviewed.  Patient's surgery has been scheduled for 10-27-15 at [facility]. This patient has been asked to stop Plavix 7 days prior to procedure. It is okay for patient to continue 81 mg aspirin once daily.     This information has been scribed by Verlene Mayer, CMA    PCP: Dr. Illa Level, [place] 10/05/2015, 4:40 PM

## 2015-10-19 ENCOUNTER — Encounter: Payer: Self-pay | Admitting: *Deleted

## 2015-10-19 ENCOUNTER — Other Ambulatory Visit: Payer: Commercial Managed Care - HMO

## 2015-10-19 ENCOUNTER — Telehealth: Payer: Self-pay | Admitting: Family Medicine

## 2015-10-19 NOTE — Telephone Encounter (Signed)
Who is Engineer, structural. Thanks.

## 2015-10-19 NOTE — Telephone Encounter (Signed)
Please review. Thanks!  

## 2015-10-19 NOTE — Telephone Encounter (Signed)
Judeen Hammans wants to know if you will send notes and labs after you see this patient tomorrow.  Please fax to [phone]  Thanks Con Memos

## 2015-10-19 NOTE — Patient Instructions (Signed)
  Your procedure is scheduled on: 10/27/15 Report to Day Surgery. [facility]OND FLOOR To find out your arrival time please call [phone] between 1PM - 3PM on 10/26/15  Remember: Instructions that are not followed completely may result in serious medical risk, up to and including death, or upon the discretion of your surgeon and anesthesiologist your surgery may need to be rescheduled.    _X___ 1. Do not eat food or drink liquids after midnight. No gum chewing or hard candies.     _X___ 2. No Alcohol for 24 hours before or after surgery.   ____ 3. Bring all medications with you on the day of surgery if instructed.    __X__ 4. Notify your doctor if there is any change in your medical condition     (cold, fever, infections).     Do not wear jewelry, make-up, hairpins, clips or nail polish.  Do not wear lotions, powders, or perfumes. You may wear deodorant.  Do not shave 48 hours prior to surgery. Men may shave face and neck.  Do not bring valuables to the hospital.    [facility] is not responsible for any belongings or valuables.               Contacts, dentures or bridgework may not be worn into surgery.  Leave your suitcase in the car. After surgery it may be brought to your room.  For patients admitted to the hospital, discharge time is determined by your                treatment team.   Patients discharged the day of surgery will not be allowed to drive home.   Please read over the following fact sheets that you were given:   Surgical Site Infection Prevention   ____ Take these medicines the morning of surgery with A SIP OF WATER:    1. NONE  2.   3.   4.  5.  6.  ____ Fleet Enema (as directed)   _X___ Use CHG Soap as directed   HAS SOAP FROM PREVIOUS SURGERY  ____ Use inhalers on the day of surgery  ____ Stop metformin 2 days prior to surgery    ____ Take 1/2 of usual insulin dose the night before surgery and none on the morning of surgery.   _X___ Stop  Coumadin/Plavix/aspirin on      CONTINUE ASPIRIN 81 MG         STOP PLAVIX 7 DAYS BEFORE SURGERY  ____ Stop Anti-inflammatories on    ____ Stop supplements until after surgery.    ____ Bring C-Pap to the hospital.

## 2015-10-20 ENCOUNTER — Other Ambulatory Visit: Payer: Self-pay

## 2015-10-20 ENCOUNTER — Ambulatory Visit (INDEPENDENT_AMBULATORY_CARE_PROVIDER_SITE_OTHER): Payer: Commercial Managed Care - HMO | Admitting: Family Medicine

## 2015-10-20 ENCOUNTER — Encounter: Payer: Self-pay | Admitting: Family Medicine

## 2015-10-20 VITALS — BP 138/60 | HR 76 | Temp 97.6°F | Resp 16 | Wt 147.0 lb

## 2015-10-20 DIAGNOSIS — G458 Other transient cerebral ischemic attacks and related syndromes: Secondary | ICD-10-CM | POA: Diagnosis not present

## 2015-10-20 DIAGNOSIS — I7090 Unspecified atherosclerosis: Secondary | ICD-10-CM | POA: Diagnosis not present

## 2015-10-20 DIAGNOSIS — M810 Age-related osteoporosis without current pathological fracture: Secondary | ICD-10-CM

## 2015-10-20 DIAGNOSIS — I10 Essential (primary) hypertension: Secondary | ICD-10-CM

## 2015-10-20 MED ORDER — ALENDRONATE SODIUM 70 MG PO TABS: ORAL_TABLET | ORAL | Status: DC

## 2015-10-20 MED ORDER — AMLODIPINE BESYLATE 10 MG PO TABS: ORAL_TABLET | ORAL | Status: DC

## 2015-10-20 MED ORDER — CLOPIDOGREL BISULFATE 75 MG PO TABS: 75.0000 mg | ORAL_TABLET | Freq: Once | ORAL | Status: DC

## 2015-10-20 NOTE — Telephone Encounter (Signed)
Con Memos, who is Tracey Rogers? Please advise. Thanks!

## 2015-10-20 NOTE — Progress Notes (Signed)
   Subjective:    Patient ID: Tracey Rogers, female    DOB: 06/01/1945, 70 y.o.   MRN: [id number]  HPI   Hypertension, follow-up:  BP Readings from Last 3 Encounters:  10/20/15 138/60  10/04/15 126/68  06/29/15 118/60    She was last seen for hypertension 10 months ago.  BP at that visit was 138/62. Management since that visit includes none. She reports good compliance with treatment. She is not having side effects. She is not exercising. She is adherent to low salt diet.   She is experiencing lower extremity edema.  Patient denies chest pain, dyspnea, fatigue, near-syncope, orthopnea, palpitations and syncope.      Weight trend: stable Wt Readings from Last 3 Encounters:  10/20/15 147 lb (66.679 kg)  10/04/15 150 lb (68.04 kg)  10/19/15 150 lb (68.04 kg)    Current diet: in general, a "healthy" diet     Is scheduled for hernia repair next week.    Tolerating Fosamax without any difficulty for osteoporosis. Needs refill. ALso needs refill of Plavix. Has ASCVD, and has had multiple stents and procedures. Follow up with vascular surgeon regularly. Doing very well. Walks with a walker.    ------------------------------------------------------------------------     Review of Systems  Constitutional: Positive for chills. Negative for fever, diaphoresis, activity change, appetite change, fatigue and unexpected weight change.  Cardiovascular: Positive for leg swelling. Negative for chest pain and palpitations.  Endocrine: Positive for cold intolerance.       Objective:   Physical Exam  Constitutional: She appears well-developed and well-nourished.  Pulmonary/Chest: Effort normal and breath sounds normal. No respiratory distress.  Abdominal:  Periumbilical hernia present  Musculoskeletal: She exhibits no edema.  Psychiatric: She has a normal mood and affect. Her behavior is normal.   BP 138/60 mmHg  Pulse 76  Temp(Src) 97.6 F (36.4 C) (Oral)  Resp 16   Wt 147 lb (66.679 kg)     Assessment & Plan:  1. Benign essential HTN Stable. Refill medication as below. Will FU with Tawanna Sat in the fall for wellness and obtain her pneumovax.    - amLODipine (NORVASC) 10 MG tablet; TAKE ONE (1) TABLET EACH DAY  Dispense: 90 tablet; Refill: 3  2. OP (osteoporosis) Stable. Refills provided. - alendronate (FOSAMAX) 70 MG tablet; TAKE ONE TABLET EVERY MORNING AT LEAST 30 MINUTES BEFORE FOOD OR BEVERAGE WITH A LITTLE WATER  Dispense: 12 tablet; Refill: 3  3. Brachial-basilar insufficiency syndrome Stable. Refills provided. Will FU with Tawanna Sat. - clopidogrel (PLAVIX) 75 MG tablet; Take 1 tablet (75 mg total) by mouth once.  Dispense: 90 tablet; Refill: 3   4. Atherosclerosis Continue treatment as above.    Patient seen and examined by Jerrell Belfast, MD, and note scribed by Renaldo Fiddler, CMA.  I have reviewed the document for accuracy and completeness and I agree with above. Jerrell Belfast, MD   Margarita Rana, MD

## 2015-10-22 NOTE — Pre-Procedure Instructions (Signed)
RECEIVED OFFICE VISIT NOTE FROM 10/20/15 WITH DR MALONEY AND NOTED TO BE STABLE

## 2015-10-27 ENCOUNTER — Ambulatory Visit
Admission: RE | Admit: 2015-10-27 | Discharge: 2015-10-27 | Disposition: A | Discharge status: Home / Self Care | Payer: Commercial Managed Care - HMO | Source: Ambulatory Visit | Attending: General Surgery | Admitting: General Surgery

## 2015-10-27 ENCOUNTER — Ambulatory Visit: Payer: Commercial Managed Care - HMO | Admitting: Anesthesiology

## 2015-10-27 ENCOUNTER — Encounter: Payer: Self-pay | Admitting: *Deleted

## 2015-10-27 ENCOUNTER — Encounter
Admission: RE | Disposition: A | Discharge status: Home / Self Care | Payer: Self-pay | Source: Ambulatory Visit | Attending: General Surgery

## 2015-10-27 DIAGNOSIS — L409 Psoriasis, unspecified: Secondary | ICD-10-CM | POA: Insufficient documentation

## 2015-10-27 DIAGNOSIS — R011 Cardiac murmur, unspecified: Secondary | ICD-10-CM | POA: Diagnosis not present

## 2015-10-27 DIAGNOSIS — Z87891 Personal history of nicotine dependence: Secondary | ICD-10-CM | POA: Insufficient documentation

## 2015-10-27 DIAGNOSIS — J449 Chronic obstructive pulmonary disease, unspecified: Secondary | ICD-10-CM | POA: Insufficient documentation

## 2015-10-27 DIAGNOSIS — F329 Major depressive disorder, single episode, unspecified: Secondary | ICD-10-CM | POA: Insufficient documentation

## 2015-10-27 DIAGNOSIS — Z823 Family history of stroke: Secondary | ICD-10-CM | POA: Diagnosis not present

## 2015-10-27 DIAGNOSIS — Z803 Family history of malignant neoplasm of breast: Secondary | ICD-10-CM | POA: Diagnosis not present

## 2015-10-27 DIAGNOSIS — I739 Peripheral vascular disease, unspecified: Secondary | ICD-10-CM | POA: Diagnosis not present

## 2015-10-27 DIAGNOSIS — K439 Ventral hernia without obstruction or gangrene: Secondary | ICD-10-CM | POA: Diagnosis not present

## 2015-10-27 DIAGNOSIS — K219 Gastro-esophageal reflux disease without esophagitis: Secondary | ICD-10-CM | POA: Diagnosis not present

## 2015-10-27 DIAGNOSIS — Z9842 Cataract extraction status, left eye: Secondary | ICD-10-CM | POA: Diagnosis not present

## 2015-10-27 DIAGNOSIS — K5900 Constipation, unspecified: Secondary | ICD-10-CM | POA: Diagnosis not present

## 2015-10-27 DIAGNOSIS — I10 Essential (primary) hypertension: Secondary | ICD-10-CM | POA: Diagnosis not present

## 2015-10-27 DIAGNOSIS — M1990 Unspecified osteoarthritis, unspecified site: Secondary | ICD-10-CM | POA: Insufficient documentation

## 2015-10-27 DIAGNOSIS — Z8249 Family history of ischemic heart disease and other diseases of the circulatory system: Secondary | ICD-10-CM | POA: Insufficient documentation

## 2015-10-27 DIAGNOSIS — Z9841 Cataract extraction status, right eye: Secondary | ICD-10-CM | POA: Insufficient documentation

## 2015-10-27 HISTORY — PX: VENTRAL HERNIA REPAIR: SHX424

## 2015-10-27 HISTORY — PX: HERNIA REPAIR: SHX51

## 2015-10-27 SURGERY — REPAIR, HERNIA, VENTRAL
Anesthesia: General | Wound class: Clean Contaminated

## 2015-10-27 MED ORDER — SUCCINYLCHOLINE CHLORIDE 20 MG/ML IJ SOLN
INTRAMUSCULAR | Status: DC | PRN
  Administered 2015-10-27: 100 mg via INTRAVENOUS

## 2015-10-27 MED ORDER — FENTANYL CITRATE (PF) 100 MCG/2ML IJ SOLN
25.0000 ug | INTRAMUSCULAR | Status: DC | PRN
  Administered 2015-10-27 (×2): 50 ug via INTRAVENOUS

## 2015-10-27 MED ORDER — LIDOCAINE HCL (CARDIAC) 20 MG/ML IV SOLN
INTRAVENOUS | Status: DC | PRN
  Administered 2015-10-27: 80 mg via INTRAVENOUS

## 2015-10-27 MED ORDER — FENTANYL CITRATE (PF) 100 MCG/2ML IJ SOLN
INTRAMUSCULAR | Status: AC
  Filled 2015-10-27: qty 2

## 2015-10-27 MED ORDER — BUPIVACAINE HCL (PF) 0.5 % IJ SOLN
INTRAMUSCULAR | Status: DC | PRN
  Administered 2015-10-27: 30 mL

## 2015-10-27 MED ORDER — PHENYLEPHRINE HCL 10 MG/ML IJ SOLN
INTRAMUSCULAR | Status: DC | PRN
  Administered 2015-10-27: 50 ug via INTRAVENOUS
  Administered 2015-10-27: 150 ug via INTRAVENOUS

## 2015-10-27 MED ORDER — FENTANYL CITRATE (PF) 100 MCG/2ML IJ SOLN
INTRAMUSCULAR | Status: DC | PRN
  Administered 2015-10-27: 100 ug via INTRAVENOUS

## 2015-10-27 MED ORDER — CEFAZOLIN SODIUM-DEXTROSE 2-4 GM/100ML-% IV SOLN
INTRAVENOUS | Status: AC
Start: 2015-10-27 — End: 2015-10-27
  Administered 2015-10-27: 2 g via INTRAVENOUS
  Filled 2015-10-27: qty 100

## 2015-10-27 MED ORDER — PROPOFOL 10 MG/ML IV BOLUS
INTRAVENOUS | Status: DC | PRN
  Administered 2015-10-27: 150 mg via INTRAVENOUS

## 2015-10-27 MED ORDER — CEFAZOLIN SODIUM-DEXTROSE 2-4 GM/100ML-% IV SOLN
2.0000 g | INTRAVENOUS | Status: AC
  Administered 2015-10-27: 2 g via INTRAVENOUS

## 2015-10-27 MED ORDER — DEXAMETHASONE SODIUM PHOSPHATE 10 MG/ML IJ SOLN
INTRAMUSCULAR | Status: DC | PRN
  Administered 2015-10-27: 5 mg via INTRAVENOUS

## 2015-10-27 MED ORDER — KETOROLAC TROMETHAMINE 30 MG/ML IJ SOLN
INTRAMUSCULAR | Status: DC | PRN
  Administered 2015-10-27: 15 mg via INTRAVENOUS

## 2015-10-27 MED ORDER — LACTATED RINGERS IV SOLN
INTRAVENOUS | Status: DC
Start: 2015-10-27 — End: 2015-10-27
  Administered 2015-10-27: 13:00:00 via INTRAVENOUS

## 2015-10-27 MED ORDER — ONDANSETRON HCL 4 MG/2ML IJ SOLN
4.0000 mg | Freq: Once | INTRAMUSCULAR | Status: DC | PRN
Start: 2015-10-27 — End: 2015-10-27

## 2015-10-27 MED ORDER — FAMOTIDINE 20 MG PO TABS
20.0000 mg | ORAL_TABLET | Freq: Once | ORAL | Status: AC
  Administered 2015-10-27: 20 mg via ORAL

## 2015-10-27 MED ORDER — HYDROCODONE-ACETAMINOPHEN 5-325 MG PO TABS: 1.0000 | ORAL_TABLET | ORAL | Status: DC | PRN

## 2015-10-27 MED ORDER — BUPIVACAINE HCL (PF) 0.5 % IJ SOLN
INTRAMUSCULAR | Status: AC
  Filled 2015-10-27: qty 30

## 2015-10-27 MED ORDER — FAMOTIDINE 20 MG PO TABS
ORAL_TABLET | ORAL | Status: AC
  Administered 2015-10-27: 20 mg via ORAL
  Filled 2015-10-27: qty 1

## 2015-10-27 MED ORDER — ONDANSETRON HCL 4 MG/2ML IJ SOLN
INTRAMUSCULAR | Status: DC | PRN
  Administered 2015-10-27: 4 mg via INTRAVENOUS

## 2015-10-27 MED ORDER — ACETAMINOPHEN 10 MG/ML IV SOLN
INTRAVENOUS | Status: AC
  Filled 2015-10-27: qty 100

## 2015-10-27 MED ORDER — ACETAMINOPHEN 10 MG/ML IV SOLN
INTRAVENOUS | Status: DC | PRN
  Administered 2015-10-27: 1000 mg via INTRAVENOUS

## 2015-10-27 SURGICAL SUPPLY — 31 items
BLADE SURG 15 STRL SS SAFETY (BLADE) ×2 IMPLANT
CANISTER SUCT 1200ML W/VALVE (MISCELLANEOUS) ×2 IMPLANT
CHLORAPREP W/TINT 26ML (MISCELLANEOUS) ×2 IMPLANT
DRAPE CHEST BREAST 77X106 FENE (MISCELLANEOUS) ×2 IMPLANT
DRAPE LAPAROTOMY 100X77 ABD (DRAPES) ×2 IMPLANT
DRSG TEGADERM 4X4.75 (GAUZE/BANDAGES/DRESSINGS) ×4 IMPLANT
DRSG TELFA 3X8 NADH (GAUZE/BANDAGES/DRESSINGS) ×2 IMPLANT
ELECT REM PT RETURN 9FT ADLT (ELECTROSURGICAL) ×2
ELECTRODE REM PT RTRN 9FT ADLT (ELECTROSURGICAL) ×1 IMPLANT
GAUZE SPONGE 4X4 12PLY STRL (GAUZE/BANDAGES/DRESSINGS) ×2 IMPLANT
GLOVE BIO SURGEON STRL SZ7.5 (GLOVE) ×2 IMPLANT
GLOVE INDICATOR 8.0 STRL GRN (GLOVE) ×2 IMPLANT
GOWN STRL REUS W/ TWL LRG LVL3 (GOWN DISPOSABLE) ×2 IMPLANT
GOWN STRL REUS W/TWL LRG LVL3 (GOWN DISPOSABLE) ×2
KIT RM TURNOVER STRD PROC AR (KITS) ×2 IMPLANT
LABEL OR SOLS (LABEL) ×2 IMPLANT
MESH VENT ST 7.6CM S CIR (Mesh General) ×2 IMPLANT
NDL SAFETY 22GX1.5 (NEEDLE) ×2 IMPLANT
NEEDLE HYPO 25X1 1.5 SAFETY (NEEDLE) ×2 IMPLANT
NS IRRIG 500ML POUR BTL (IV SOLUTION) ×2 IMPLANT
PACK BASIN MINOR ARMC (MISCELLANEOUS) ×2 IMPLANT
SPONGE LAP 18X18 5 PK (GAUZE/BANDAGES/DRESSINGS) ×2 IMPLANT
STRIP CLOSURE SKIN 1/2X4 (GAUZE/BANDAGES/DRESSINGS) ×2 IMPLANT
SUT PROLENE 0 CT 1 30 (SUTURE) ×4 IMPLANT
SUT SURGILON 0 BLK (SUTURE) ×4 IMPLANT
SUT VIC AB 2-0 BRD 54 (SUTURE) ×2 IMPLANT
SUT VIC AB 3-0 SH 27 (SUTURE) ×1
SUT VIC AB 3-0 SH 27X BRD (SUTURE) ×1 IMPLANT
SUT VIC AB 4-0 FS2 27 (SUTURE) ×4 IMPLANT
SUT VICRYL+ 3-0 144IN (SUTURE) ×2 IMPLANT
SYR CONTROL 10ML (SYRINGE) ×2 IMPLANT

## 2015-10-27 NOTE — Transfer of Care (Signed)
Immediate Anesthesia Transfer of Care Note  Patient: Tracey Rogers  Procedure(s) Performed: Procedure(s): HERNIA REPAIR VENTRAL ADULT (N/A)  Patient Location: PACU  Anesthesia Type:General  Level of Consciousness: awake  Airway & Oxygen Therapy: Patient Spontanous Breathing  Post-op Assessment: Report given to RN  Post vital signs: stable  Last Vitals:  Filed Vitals:   10/27/15 1221 10/27/15 1408  BP: 153/79 153/70  Pulse: 76 84  Temp: 35.9 C 36.4 C  Resp: 16 17    Last Pain: There were no vitals filed for this visit.       Complications: No apparent anesthesia complications

## 2015-10-27 NOTE — Op Note (Signed)
Preoperative diagnosis: Ventral hernia.  Postoperative diagnosis: Same.  Operative procedure: Repair of ventral hernia with Ventrio ST mesh.  Operating surgeon: Ollen Bowl, M.D.  Anesthesia: Gen. by endotracheal, Marcaine 0.5%, plain 30 mL local infiltration.  Estimated blood loss: Less than 5 mL.  Clinical note: This 70 year old woman underwent laparoscopic cholecystectomy earlier this year. A fascial defect at the umbilicus had been made to allow passage of a thickened gallbladder. This was closed with interrupted 2-0 PDS sutures. She has recently developed a symptomatic ventral hernia and was him in for elective repair. Plavix has been discontinued for 1 week and she received Ancef prior to procedure. SCD stockings for DVT prevention.  Operative note: With the patient under adequate general anesthesia the abdomen was prepped with ChloraPrep and draped. Marcaine was infiltrated for postoperative analgesia. An incision was made beginning about 2 inches above the umbilicus and extending through the old transmural incision. The skin was incised sharply and the remaining dissection completed with electrocautery. The abdomen was entered. Adhesions of the omentum to the anterior abdominal wall and the hernia were taken down with cautery dissection. No evidence of bowel involvement. The peritoneal surface was swept around the hernia site. The skin and subcutis tissue was elevated off the underlying fascia. A 3 inch coated bilayer mesh was placed in the intraperitoneal position. This was held in place with trans-fascial transfixion sutures of 0 Prolene at the 12, 3, 6 and 9:00 position. The fascia was then approximated with interrupted 0 Prolene simple sutures taking a small bite of the mesh with each fascial stitch. The adipose layer was approximated with a running 3-0 Vicryl suture. The skin was closed with interrupted 4-0 Vicryl septic sutures. Benzoin, Steri-Strips, Telfa and tailored and dressings  were applied.  Patient tolerated procedure well and was taken to the recovery room in stable condition.

## 2015-10-27 NOTE — H&P (Signed)
No change in clinical history or exam.  For repair of ventral hernia.

## 2015-10-27 NOTE — Anesthesia Preprocedure Evaluation (Signed)
Anesthesia Evaluation  Patient identified by MRN, date of birth, ID band Patient awake    Reviewed: Allergy & Precautions, H&P , NPO status , Patient's Chart, lab work & pertinent test results, reviewed documented beta blocker date and time   History of Anesthesia Complications (+) Family history of anesthesia reaction and history of anesthetic complications  Airway Mallampati: II  TM Distance: >3 FB Neck ROM: full    Dental no notable dental hx. (+) Edentulous Upper, Edentulous Lower, Upper Dentures, Lower Dentures   Pulmonary neg shortness of breath, neg sleep apnea, COPD, neg recent URI, former smoker,    Pulmonary exam normal breath sounds clear to auscultation       Cardiovascular Exercise Tolerance: Good hypertension, (-) angina+ Peripheral Vascular Disease  (-) CAD, (-) Past MI, (-) Cardiac Stents and (-) CABG Normal cardiovascular exam(-) dysrhythmias + Valvular Problems/Murmurs  Rhythm:regular Rate:Normal     Neuro/Psych PSYCHIATRIC DISORDERS (Depression) negative neurological ROS     GI/Hepatic Neg liver ROS, GERD  ,  Endo/Other  negative endocrine ROS  Renal/GU negative Renal ROS  negative genitourinary   Musculoskeletal   Abdominal   Peds  Hematology negative hematology ROS (+)   Anesthesia Other Findings Past Medical History:   Hypertension                                                 Ulcer of left ankle (HCC)                                    Peripheral vascular disease (HCC)                            Constipation                                                 Psoriasis                                                    Skin ulcer of right great toe (HCC)                          Wears dentures                                               GERD (gastroesophageal reflux disease)                       Heart murmur                                                 COPD (chronic obstructive  pulmonary disease) Marland Kitchen  Comment:mild   Arthritis                                                    Psoriasis                                                    Complication of anesthesia                                     Comment:gets weak after surgery   Family history of adverse reaction to anesthes*                Comment:son gets postop nausea and vomiting, weak   Reproductive/Obstetrics negative OB ROS                             Anesthesia Physical Anesthesia Plan  ASA: III  Anesthesia Plan: General   Post-op Pain Management:    Induction:   Airway Management Planned:   Additional Equipment:   Intra-op Plan:   Post-operative Plan:   Informed Consent: I have reviewed the patients History and Physical, chart, labs and discussed the procedure including the risks, benefits and alternatives for the proposed anesthesia with the patient or authorized representative who has indicated his/her understanding and acceptance.   Dental Advisory Given  Plan Discussed with: Anesthesiologist, CRNA and Surgeon  Anesthesia Plan Comments:         Anesthesia Quick Evaluation

## 2015-10-27 NOTE — Anesthesia Procedure Notes (Signed)
Procedure Name: Intubation Date/Time: 10/27/2015 1:11 PM Performed by: Zetta Bills Pre-anesthesia Checklist: Patient identified, Emergency Drugs available, Suction available and Patient being monitored Patient Re-evaluated:Patient Re-evaluated prior to inductionOxygen Delivery Method: Circle system utilized Preoxygenation: Pre-oxygenation with 100% oxygen Intubation Type: IV induction Ventilation: Mask ventilation without difficulty Laryngoscope Size: Mac and 3 Grade View: Grade I Tube type: Oral Tube size: 7.0 mm Number of attempts: 1 Airway Equipment and Method: Stylet Placement Confirmation: ETT inserted through vocal cords under direct vision,  positive ETCO2 and breath sounds checked- equal and bilateral Secured at: 20 cm Tube secured with: Tape

## 2015-10-27 NOTE — Progress Notes (Signed)
Dr Bary Castilla in to see pt, advises ok to d/c to home. He instructed fam/pt she could remove bandage on Saturday if she would like to, leave steri strips in place

## 2015-10-29 NOTE — Anesthesia Postprocedure Evaluation (Signed)
Anesthesia Post Note  Patient: Tracey Rogers  Procedure(s) Performed: Procedure(s) (LRB): HERNIA REPAIR VENTRAL ADULT (N/A)  Patient location during evaluation: PACU Anesthesia Type: General Level of consciousness: awake and alert Pain management: pain level controlled Vital Signs Assessment: post-procedure vital signs reviewed and stable Respiratory status: spontaneous breathing, nonlabored ventilation, respiratory function stable and patient connected to nasal cannula oxygen Cardiovascular status: blood pressure returned to baseline and stable Postop Assessment: no signs of nausea or vomiting Anesthetic complications: no    Last Vitals:  Filed Vitals:   10/27/15 1508 10/27/15 1545  BP: 159/63 140/58  Pulse: 75 66  Temp: 36.5 C   Resp: 16 16    Last Pain:  Filed Vitals:   10/28/15 0833  PainSc: 2                  Martha Clan

## 2015-11-01 ENCOUNTER — Encounter: Payer: Self-pay | Admitting: *Deleted

## 2015-11-01 NOTE — Telephone Encounter (Signed)
Judeen Hammans is the anesthesiologist at [facility] anesthesiology.

## 2015-11-08 ENCOUNTER — Encounter: Payer: Self-pay | Admitting: General Surgery

## 2015-11-08 ENCOUNTER — Ambulatory Visit (INDEPENDENT_AMBULATORY_CARE_PROVIDER_SITE_OTHER): Payer: Commercial Managed Care - HMO | Admitting: General Surgery

## 2015-11-08 VITALS — BP 120/74 | HR 88 | Resp 12 | Ht 64.0 in | Wt 143.4 lb

## 2015-11-08 DIAGNOSIS — L97409 Non-pressure chronic ulcer of unspecified heel and midfoot with unspecified severity: Secondary | ICD-10-CM | POA: Diagnosis not present

## 2015-11-08 DIAGNOSIS — L97509 Non-pressure chronic ulcer of other part of unspecified foot with unspecified severity: Secondary | ICD-10-CM | POA: Diagnosis not present

## 2015-11-08 DIAGNOSIS — K439 Ventral hernia without obstruction or gangrene: Secondary | ICD-10-CM

## 2015-11-08 DIAGNOSIS — L97309 Non-pressure chronic ulcer of unspecified ankle with unspecified severity: Secondary | ICD-10-CM | POA: Diagnosis not present

## 2015-11-08 DIAGNOSIS — I998 Other disorder of circulatory system: Secondary | ICD-10-CM | POA: Diagnosis not present

## 2015-11-08 DIAGNOSIS — I6523 Occlusion and stenosis of bilateral carotid arteries: Secondary | ICD-10-CM | POA: Diagnosis not present

## 2015-11-08 DIAGNOSIS — I70299 Other atherosclerosis of native arteries of extremities, unspecified extremity: Secondary | ICD-10-CM | POA: Diagnosis not present

## 2015-11-08 DIAGNOSIS — I739 Peripheral vascular disease, unspecified: Secondary | ICD-10-CM | POA: Diagnosis not present

## 2015-11-08 DIAGNOSIS — E785 Hyperlipidemia, unspecified: Secondary | ICD-10-CM | POA: Diagnosis not present

## 2015-11-08 DIAGNOSIS — I10 Essential (primary) hypertension: Secondary | ICD-10-CM | POA: Diagnosis not present

## 2015-11-08 NOTE — Patient Instructions (Addendum)
Activity as tolerated. You may drive when you are pain free and comfortable. You may use a heating pad for comfort.  Follow up as needed.

## 2015-11-08 NOTE — Progress Notes (Signed)
Patient ID: Tracey Rogers, female   DOB: 05/24/1946, 70 y.o.   MRN: [id number]  Chief Complaint  Patient presents with  . Routine Post Op    HPI Tracey Rogers is a 70 y.o. female here for a post op from ventral hernia surgery done on 10/27/15. She reports that she is doing well. Minimal pain.   I personally reviewed the patient's history. HPI  Past Medical History  Diagnosis Date  . Hypertension   . Ulcer of left ankle ([place])   . Peripheral vascular disease ([place])   . Constipation   . Psoriasis   . Skin ulcer of right great toe ([place])   . Wears dentures   . GERD (gastroesophageal reflux disease)   . Heart murmur   . COPD (chronic obstructive pulmonary disease) (HCC)     mild  . Arthritis   . Psoriasis   . Complication of anesthesia     gets weak after surgery  . Family history of adverse reaction to anesthesia     son gets postop nausea and vomiting, weak    Past Surgical History  Procedure Laterality Date  . Cataract extraction w/ intraocular lens  implant, bilateral  2015  . Endovascular stent insertion  sept  &  oct  2015    LEFT LEG STENTING--  common and superficial femoral artery  . Cardiovascular stress test  03-03-2014  dr Saralyn Pilar    normal lexi scan sestamibi study/  normal LVF without evidence for significant scar or ischemia  . Transthoracic echocardiogram  03-03-2014    normal LVF/  ef 55-60%/  mild TI and MI  . Incision and drainage of wound Bilateral 04/13/2014    Procedure: IRRIGATION AND DEBRIDEMENT OF LEFT ANKLE WOUND AND RIGHT FOOT;  Surgeon: Theodoro Kos, DO;  Location: [facility];  Service: Plastics;  Laterality: Bilateral;  . Application of a-cell of extremity Left 04/13/2014    Procedure: PLACEMENT OF APPLICATION OF A-CELL ;  Surgeon: Theodoro Kos, DO;  Location: [facility];  Service: Plastics;  Laterality: Left;  . I&d extremity Left 09/10/2014    Procedure: IRRIGATION AND DEBRIDEMENT LEFT ANKLE WOUND  SURGICAL PREP ;  Surgeon: Theodoro Kos, DO;  Location: [facility];  Service: Plastics;  Laterality: Left;  . Application of a-cell of extremity Left 09/10/2014    Procedure: APPLICATION OF A-CELL OF EXTREMITY;  Surgeon: Theodoro Kos, DO;  Location: [facility];  Service: Plastics;  Laterality: Left;  . Endovascular stent insertion  May 27, 2014    LifeStent bare metal stent left SFA  . Cholecystectomy N/A 06/16/2015    Procedure: LAPAROSCOPIC CHOLECYSTECTOMY WITH CHOLANGIOGRAM;  Surgeon: Robert Bellow, MD;  Location: [facility];  Service: General;  Laterality: N/A;  . Ventral hernia repair N/A 10/27/2015    Procedure: HERNIA REPAIR VENTRAL ADULT;  Surgeon: Robert Bellow, MD;  Location: [facility];  Service: General;  Laterality: N/A;  . Hernia repair  10/27/2015    Ventral hernia repaired with Ventrio ST mesh above previous umbilical port site    Family History  Problem Relation Age of Onset  . Breast cancer Maternal Aunt 60  . Kidney disease Mother   . Hypertension Mother   . Heart disease Mother   . Heart disease Sister   . Stroke Sister     Social History Social History  Substance Use Topics  . Smoking status: Former Smoker -- 0.50 packs/day for 42 years    Types: Cigarettes  Quit date: 12/24/2013  . Smokeless tobacco: Former Systems developer     Comment: no smokers in her home  . Alcohol Use: No    Allergies  Allergen Reactions  . Clarithromycin Rash    "Mycin"  . Methocarbamol Rash  . Minocin [Minocycline Hcl] Rash  . Other Rash    Optifoam applied to her left achilles wound.   . Tetracycline Rash  . Tetracyclines & Related Rash    Current Outpatient Prescriptions  Medication Sig Dispense Refill  . acetaminophen (TYLENOL) 325 MG tablet Take 650 mg by mouth every 6 (six) hours as needed.    Marland Kitchen alendronate (FOSAMAX) 70 MG tablet TAKE ONE TABLET EVERY WEEK AT LEAST 30 MINUTES BEFORE FOOD OR BEVERAGE WITH A LITTLE WATER 12 tablet 3  .  amLODipine (NORVASC) 10 MG tablet TAKE ONE (1) TABLET EACH DAY 90 tablet 3  . aspirin EC 81 MG tablet Take 81 mg by mouth daily. In evening    . fluticasone (FLONASE) 50 MCG/ACT nasal spray Place 2 sprays into both nostrils daily. (Patient taking differently: Place 2 sprays into both nostrils daily. As needed) 16 g 2  . HYDROcodone-acetaminophen (NORCO) 5-325 MG tablet Take 1-2 tablets by mouth every 4 (four) hours as needed for moderate pain. 30 tablet 0  . loratadine (CLARITIN) 10 MG tablet Take 10 mg by mouth daily. As needed    . Magnesium 400 MG CAPS Take 400 mg by mouth every evening. As needed for leg cramps.    . triamcinolone cream (KENALOG) 0.1 % Apply 1 application topically 2 (two) times daily. As needed    . clopidogrel (PLAVIX) 75 MG tablet Take 75 mg by mouth daily.     No current facility-administered medications for this visit.    Review of Systems Review of Systems  Constitutional: Negative.   Respiratory: Negative.   Cardiovascular: Negative.     Blood pressure 120/74, pulse 88, resp. rate 12, height 5\' 4"  (1.626 m), weight 143 lb 6.4 oz (65.046 kg).  Physical Exam Physical Exam  Constitutional: She is oriented to person, place, and time. She appears well-developed and well-nourished.  Eyes: Conjunctivae are normal. No scleral icterus.  Neck: Neck supple.  Cardiovascular: Normal rate, regular rhythm and normal heart sounds.   Pulmonary/Chest: Effort normal and breath sounds normal.  Abdominal:    Well healed incision  Neurological: She is alert and oriented to person, place, and time.  Skin: Skin is warm and dry.       Assessment    Doing well status post ventral hernia repair.     Plan    While the patient is not very active, she was encouraged to use proper lifting techniques. She has been encouraged to report any problems. Follow up otherwise will be on an as-needed basis.    PCP: Venia Minks  This has been scribed by Lesly Rubenstein  LPN    Robert Bellow 11/08/2015, 9:51 PM

## 2015-11-24 ENCOUNTER — Other Ambulatory Visit: Payer: Self-pay | Admitting: Family Medicine

## 2015-11-24 DIAGNOSIS — Z1231 Encounter for screening mammogram for malignant neoplasm of breast: Secondary | ICD-10-CM

## 2015-12-13 ENCOUNTER — Other Ambulatory Visit: Payer: Self-pay | Admitting: Family Medicine

## 2015-12-13 ENCOUNTER — Ambulatory Visit
Admission: RE | Admit: 2015-12-13 | Discharge: 2015-12-13 | Disposition: A | Discharge status: Home / Self Care | Payer: Commercial Managed Care - HMO | Source: Ambulatory Visit | Attending: Family Medicine | Admitting: Family Medicine

## 2015-12-13 DIAGNOSIS — Z1231 Encounter for screening mammogram for malignant neoplasm of breast: Secondary | ICD-10-CM

## 2016-02-14 DIAGNOSIS — I998 Other disorder of circulatory system: Secondary | ICD-10-CM | POA: Diagnosis not present

## 2016-02-14 DIAGNOSIS — I739 Peripheral vascular disease, unspecified: Secondary | ICD-10-CM | POA: Diagnosis not present

## 2016-02-14 DIAGNOSIS — L97509 Non-pressure chronic ulcer of other part of unspecified foot with unspecified severity: Secondary | ICD-10-CM | POA: Diagnosis not present

## 2016-02-14 DIAGNOSIS — I10 Essential (primary) hypertension: Secondary | ICD-10-CM | POA: Diagnosis not present

## 2016-02-14 DIAGNOSIS — I70299 Other atherosclerosis of native arteries of extremities, unspecified extremity: Secondary | ICD-10-CM | POA: Diagnosis not present

## 2016-02-14 DIAGNOSIS — I6523 Occlusion and stenosis of bilateral carotid arteries: Secondary | ICD-10-CM | POA: Diagnosis not present

## 2016-02-14 DIAGNOSIS — L97309 Non-pressure chronic ulcer of unspecified ankle with unspecified severity: Secondary | ICD-10-CM | POA: Diagnosis not present

## 2016-02-14 DIAGNOSIS — E785 Hyperlipidemia, unspecified: Secondary | ICD-10-CM | POA: Diagnosis not present

## 2016-02-14 DIAGNOSIS — L97409 Non-pressure chronic ulcer of unspecified heel and midfoot with unspecified severity: Secondary | ICD-10-CM | POA: Diagnosis not present

## 2016-02-23 ENCOUNTER — Ambulatory Visit (INDEPENDENT_AMBULATORY_CARE_PROVIDER_SITE_OTHER): Payer: Commercial Managed Care - HMO | Admitting: Physician Assistant

## 2016-02-23 ENCOUNTER — Encounter: Payer: Self-pay | Admitting: Physician Assistant

## 2016-02-23 VITALS — BP 122/60 | HR 60 | Temp 97.9°F | Resp 16 | Ht 64.0 in | Wt 150.6 lb

## 2016-02-23 DIAGNOSIS — M810 Age-related osteoporosis without current pathological fracture: Secondary | ICD-10-CM

## 2016-02-23 DIAGNOSIS — Z0000 Encounter for general adult medical examination without abnormal findings: Secondary | ICD-10-CM | POA: Diagnosis not present

## 2016-02-23 DIAGNOSIS — Z780 Asymptomatic menopausal state: Secondary | ICD-10-CM | POA: Diagnosis not present

## 2016-02-23 DIAGNOSIS — Z23 Encounter for immunization: Secondary | ICD-10-CM | POA: Diagnosis not present

## 2016-02-23 NOTE — Patient Instructions (Signed)

## 2016-02-23 NOTE — Progress Notes (Signed)
Patient: Tracey Rogers, Female    DOB: [birth year], 70 y.o.   MRN: [id number] Visit Date: 02/23/2016  Today's Provider: Mar Daring, PA-C   Chief Complaint  Patient presents with  . Medicare Wellness   Subjective:    Annual wellness visit Tracey Rogers is a 70 y.o. female. She feels well. She reports exercising walks. She reports she is sleeping well. Patient declined Influenza vaccine.  Mammogram:12/13/2015 BMD:06/03/2013-osteoporosis-currently on Fosamax PCV13: 02/19/2015 Patient refuses colonoscopy Patient refuses labs -----------------------------------------------------------   Review of Systems  Constitutional: Positive for chills (on plavix). Negative for activity change, appetite change, diaphoresis, fatigue, fever and unexpected weight change.  HENT: Positive for hearing loss, mouth sores (cold sores) and tinnitus. Negative for congestion, dental problem, ear discharge, ear pain, postnasal drip, rhinorrhea, sinus pressure, sneezing, sore throat, trouble swallowing and voice change.   Eyes: Negative.   Respiratory: Positive for cough (occasional). Negative for apnea, choking, chest tightness, shortness of breath, wheezing and stridor.   Cardiovascular: Positive for leg swelling (left LE). Negative for chest pain and palpitations.  Gastrointestinal: Negative.   Endocrine: Negative.   Genitourinary: Negative.   Musculoskeletal: Positive for arthralgias and gait problem (uses rolling walker). Negative for back pain, joint swelling, myalgias, neck pain and neck stiffness.  Skin: Positive for wound (posterior left achilles; healing well; followed by wound care).  Allergic/Immunologic: Negative.   Neurological: Positive for weakness. Negative for dizziness, seizures, syncope, light-headedness, numbness and headaches.  Hematological: Negative for adenopathy. Bruises/bleeds easily (plavix).  Psychiatric/Behavioral: Negative.     Social History    Social History  . Marital status: Married    Spouse name: N/A  . Number of children: N/A  . Years of education: N/A   Occupational History  . Not on file.   Social History Main Topics  . Smoking status: Former Smoker    Packs/day: 0.50    Years: 42.00    Types: Cigarettes    Quit date: 12/24/2013  . Smokeless tobacco: Former Systems developer     Comment: no smokers in her home  . Alcohol use No  . Drug use: No  . Sexual activity: Not on file   Other Topics Concern  . Not on file   Social History Narrative  . No narrative on file    Past Medical History:  Diagnosis Date  . Arthritis   . Complication of anesthesia    gets weak after surgery  . Constipation   . COPD (chronic obstructive pulmonary disease) (HCC)    mild  . Family history of adverse reaction to anesthesia    son gets postop nausea and vomiting, weak  . GERD (gastroesophageal reflux disease)   . Heart murmur   . Hypertension   . Peripheral vascular disease ([place])   . Psoriasis   . Psoriasis   . Skin ulcer of right great toe ([place])   . Ulcer of left ankle ([place])   . Wears dentures      Patient Active Problem List   Diagnosis Date Noted  . Atherosclerosis 10/20/2015  . Ventral hernia without obstruction or gangrene 10/05/2015  . 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 Gallstones 02/19/2015  . History of alcohol use disorder 09/22/2014  . Allergic rhinitis 09/22/2014  . Chronic constipation 09/22/2014  . History of repeated overdose 09/22/2014  . Below normal amount of sodium in the blood 09/22/2014  . Depression, major, single episode 09/22/2014  . Arthritis, degenerative 09/22/2014  . OP (osteoporosis) 09/22/2014  .  Psoriasis 09/22/2014  . Foot ulcer ([place]) 04/10/2014  . History of tobacco abuse 02/16/2014  . Ankle ulcer ([place]) 02/16/2014  . CAFL (chronic airflow limitation) ([place]) 02/16/2014  . Benign essential HTN 02/16/2014  . 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 Leg ulcer ([place])  02/10/2014    Past Surgical History:  Procedure Laterality Date  . APPLICATION OF A-CELL OF EXTREMITY Left 04/13/2014   Procedure: PLACEMENT OF APPLICATION OF A-CELL ;  Surgeon: Theodoro Kos, DO;  Location: [place];  Service: Plastics;  Laterality: Left;  . APPLICATION OF A-CELL OF EXTREMITY Left 09/10/2014   Procedure: APPLICATION OF A-CELL OF EXTREMITY;  Surgeon: Theodoro Kos, DO;  Location: [place];  Service: Plastics;  Laterality: Left;  . CARDIOVASCULAR STRESS TEST  03-03-2014  dr Saralyn Pilar   normal lexi scan sestamibi study/  normal LVF without evidence for significant scar or ischemia  . CATARACT EXTRACTION W/ INTRAOCULAR LENS  IMPLANT, BILATERAL  2015  . CHOLECYSTECTOMY N/A 06/16/2015   Procedure: LAPAROSCOPIC CHOLECYSTECTOMY WITH CHOLANGIOGRAM;  Surgeon: Robert Bellow, MD;  Location: [facility];  Service: General;  Laterality: N/A;  . ENDOVASCULAR STENT INSERTION  sept  &  oct  2015   LEFT LEG STENTING--  common and superficial femoral artery  . ENDOVASCULAR STENT INSERTION  May 27, 2014   LifeStent bare metal stent left SFA  . HERNIA REPAIR  10/27/2015   Ventral hernia repaired with Ventrio ST mesh above previous umbilical port site  . I&D EXTREMITY Left 09/10/2014   Procedure: IRRIGATION AND DEBRIDEMENT LEFT ANKLE WOUND SURGICAL PREP ;  Surgeon: Theodoro Kos, DO;  Location: [place];  Service: Plastics;  Laterality: Left;  . INCISION AND DRAINAGE OF WOUND Bilateral 04/13/2014   Procedure: IRRIGATION AND DEBRIDEMENT OF LEFT ANKLE WOUND AND RIGHT FOOT;  Surgeon: Theodoro Kos, DO;  Location: [place];  Service: Plastics;  Laterality: Bilateral;  . TRANSTHORACIC ECHOCARDIOGRAM  03-03-2014   normal LVF/  ef 55-60%/  mild TI and MI  . VENTRAL HERNIA REPAIR N/A 10/27/2015   Procedure: HERNIA REPAIR VENTRAL ADULT;  Surgeon: Robert Bellow, MD;  Location: [facility];  Service: General;  Laterality: N/A;     Her family history includes Breast cancer (age of onset: 54) in her maternal aunt; Heart disease in her mother and sister; Hypertension in her mother; Kidney disease in her mother; Stroke in her sister.    Current Meds  Medication Sig  . acetaminophen (TYLENOL) 325 MG tablet Take 650 mg by mouth every 6 (six) hours as needed.  Marland Kitchen alendronate (FOSAMAX) 70 MG tablet TAKE ONE TABLET EVERY WEEK AT LEAST 30 MINUTES BEFORE FOOD OR BEVERAGE WITH A LITTLE WATER  . amLODipine (NORVASC) 10 MG tablet TAKE ONE (1) TABLET EACH DAY  . aspirin EC 81 MG tablet Take 81 mg by mouth daily. In evening  . clopidogrel (PLAVIX) 75 MG tablet Take 75 mg by mouth daily.  . fluticasone (FLONASE) 50 MCG/ACT nasal spray Place 2 sprays into both nostrils daily. (Patient taking differently: Place 2 sprays into both nostrils daily. As needed)  . loratadine (CLARITIN) 10 MG tablet Take 10 mg by mouth daily. As needed  . Magnesium 400 MG CAPS Take 400 mg by mouth every evening. As needed for leg cramps.  . triamcinolone cream (KENALOG) 0.1 % Apply 1 application topically 2 (two) times daily. As needed    Patient Care Team: Margarita Rana, MD as PCP - General (Family Medicine) Margarita Rana,  MD as Referring Physician (Family Medicine) Robert Bellow, MD (General Surgery)    Objective:   Vitals: BP 122/60 (BP Location: Right Arm, Patient Position: Sitting, Cuff Size: Normal)   Pulse 60   Temp 97.9 F (36.6 C) (Oral)   Resp 16   Ht 5\' 4"  (1.626 m)   Wt 150 lb 9.6 oz (68.3 kg)   BMI 25.85 kg/m   Physical Exam  Constitutional: She is oriented to person, place, and time. She appears well-developed and well-nourished. No distress.  HENT:  Head: Normocephalic and atraumatic.  Right Ear: Tympanic membrane, external ear and ear canal normal. Decreased hearing is noted.  Left Ear: Tympanic membrane, external ear and ear canal normal. Decreased hearing is noted.  Nose: Nose normal.  Mouth/Throat: Uvula is midline,  oropharynx is clear and moist and mucous membranes are normal. She has dentures. No oropharyngeal exudate.  Eyes: Conjunctivae and EOM are normal. Pupils are equal, round, and reactive to light. Right eye exhibits no discharge. Left eye exhibits no discharge. No scleral icterus.  Neck: Normal range of motion. Neck supple. No JVD present. Carotid bruit is not present. No tracheal deviation present. No thyromegaly present.  Cardiovascular: Normal rate and regular rhythm.  Exam reveals no gallop and no friction rub.   Murmur heard.  Systolic murmur is present with a grade of 2/6  Pulmonary/Chest: Effort normal. No respiratory distress. She has decreased breath sounds. She has no wheezes. She has no rales. She exhibits no tenderness.  Abdominal: Soft. Bowel sounds are normal. She exhibits no distension and no mass. There is no tenderness. There is no rebound and no guarding.  Musculoskeletal: Normal range of motion. She exhibits no edema or tenderness.  Lymphadenopathy:    She has no cervical adenopathy.  Neurological: She is alert and oriented to person, place, and time.  Skin: Skin is warm and dry. No rash noted. She is not diaphoretic.  Psychiatric: She has a normal mood and affect. Her behavior is normal. Judgment and thought content normal.  Vitals reviewed.   Activities of Daily Living In your present state of health, do you have any difficulty performing the following activities: 02/23/2016 10/19/2015  Hearing? Y N  Vision? N N  Difficulty concentrating or making decisions? Y N  Walking or climbing stairs? Y Y  Dressing or bathing? N N  Doing errands, shopping? Y -  Some recent data might be hidden    Fall Risk Assessment Fall Risk  02/23/2016 02/19/2015  Falls in the past year? No Yes  Injury with Fall? - Yes     Depression Screen PHQ 2/9 Scores 02/23/2016 02/19/2015  PHQ - 2 Score 0 0    Cognitive Testing - 6-CIT  Correct? Score   What year is it? yes 0 0 or 4  What month is  it? yes 0 0 or 3  Memorize:    Pia Mau,  42,  [place],      What time is it? (within 1 hour) yes 0 0 or 3  Count backwards from 20 yes 0 0, 2, or 4  Name the months of the year yes 0 0, 2, or 4  Repeat name & address above no 4 0, 2, 4, 6, 8, or 10       TOTAL SCORE  4/28   Interpretation:  Normal  Normal (0-7) Abnormal (8-28)   Audit-C Alcohol Use Screening  Question Answer Points  How often do you have alcoholic drink?  never 0  On days you do drink alcohol, how many drinks do you typically consume? 0 0  How oftey will you drink 6 or more in a total? never 0  Total Score:  0   A score of 3 or more in women, and 4 or more in men indicates increased risk for alcohol abuse, EXCEPT if all of the points are from question 1.     Assessment & Plan:     Annual Wellness Visit  Reviewed patient's Family Medical History Reviewed and updated list of patient's medical providers Assessment of cognitive impairment was done Assessed patient's functional ability Established a written schedule for health screening [place] Completed and Reviewed  Exercise Activities and Dietary recommendations Goals    None      Immunization History  Administered Date(s) Administered  . Pneumococcal Conjugate-13 02/19/2015    Health Maintenance  Topic Date Due  . Hepatitis C Screening  08-Jul-1945  . TETANUS/TDAP  03/02/1965  . COLONOSCOPY  03/02/1996  . ZOSTAVAX  03/02/2006  . INFLUENZA VACCINE  12/28/2015  . PNA vac Low Risk Adult (2 of 2 - PPSV23) 02/19/2016  . MAMMOGRAM  12/12/2017  . DEXA SCAN  Completed      Discussed health benefits of physical activity, and encouraged her to engage in regular exercise appropriate for her age and condition.    1. Medicare annual wellness visit, subsequent Fairly normal physical exam with exception of chronic issues noted on PE.   2. Osteoporosis On fosamax. Will recheck BMD since last one was 10/2013. Will  f/u pending results.  - DG Bone Density; Future  3. Postmenopausal estrogen deficiency See above medical treatment plan. - DG Bone Density; Future  4. Need for pneumococcal vaccination Pneumococcal-23 vaccine given to patient without complications. Patient sat for 15 minutes after administration and was tolerated well without adverse effects. - Pneumococcal polysaccharide vaccine 23-valent greater than or equal to 2yo subcutaneous/IM  ------------------------------------------------------------------------------------------------------------    Mar Daring, PA-C  [facility]

## 2016-02-26 IMAGING — MG MM SCREENING BREAST TOMO BILATERAL
8 of 12 series · 8 of 28 positions shown · non-contrast
Comparison: Previous exam(s).

CLINICAL DATA: Screening.

EXAM:
DIGITAL SCREENING BILATERAL MAMMOGRAM WITH 3D TOMO WITH CAD

[R MLO synth-2D]
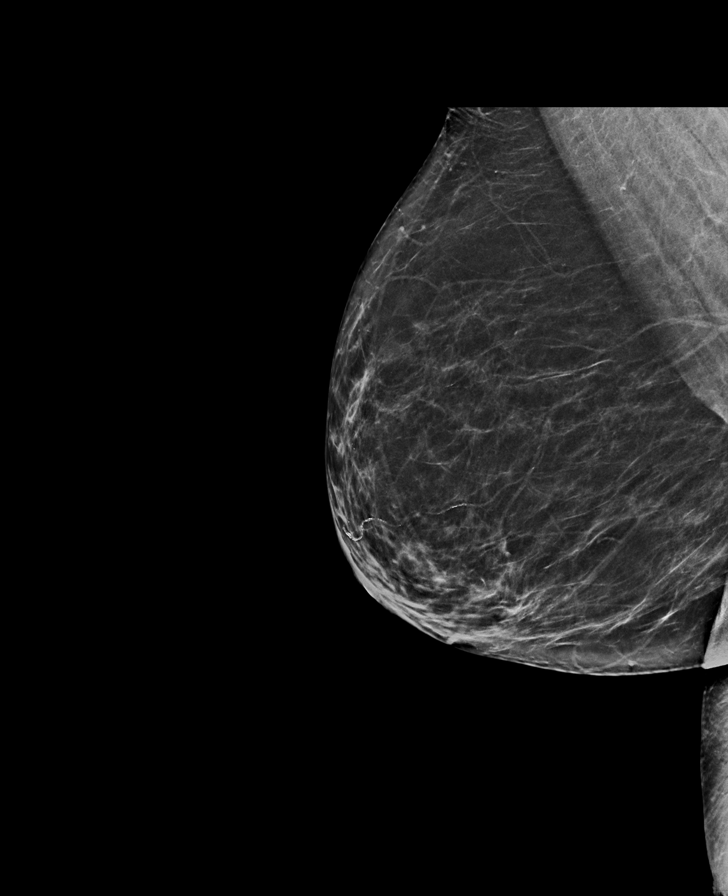

[L MLO]
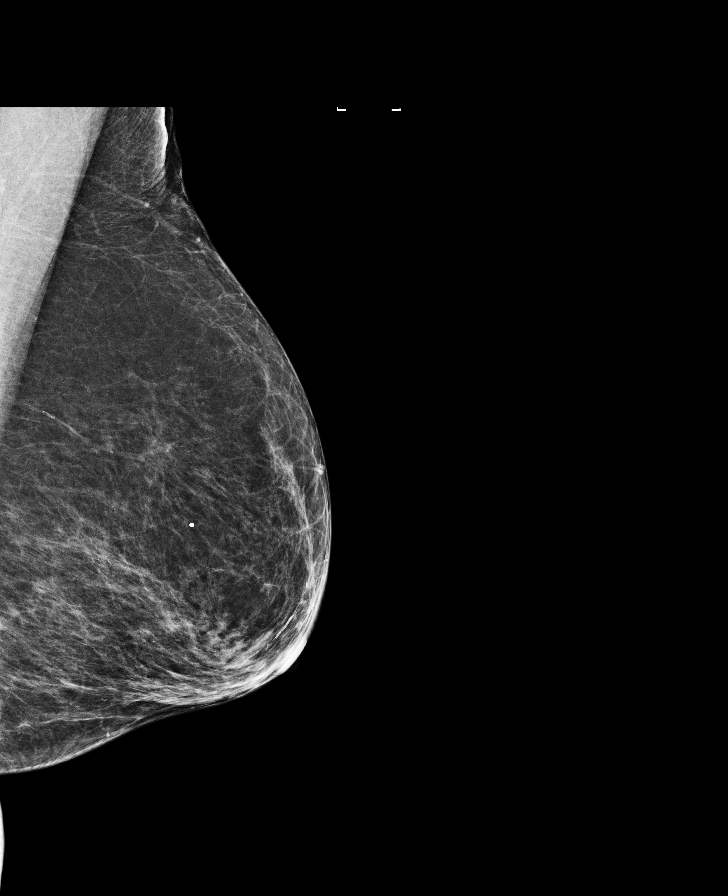

[R CC]
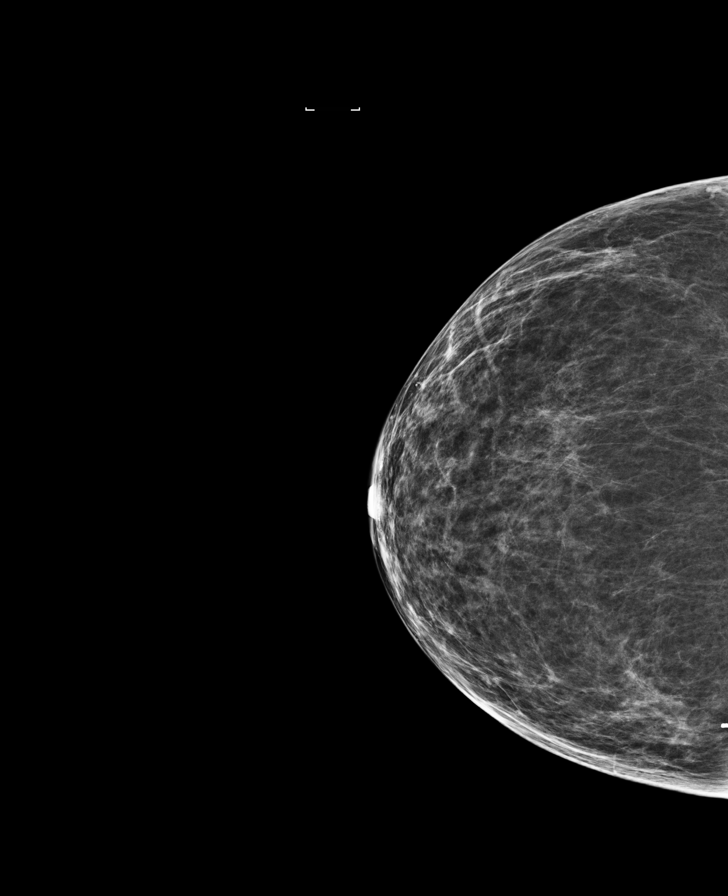

[L CC synth-2D]
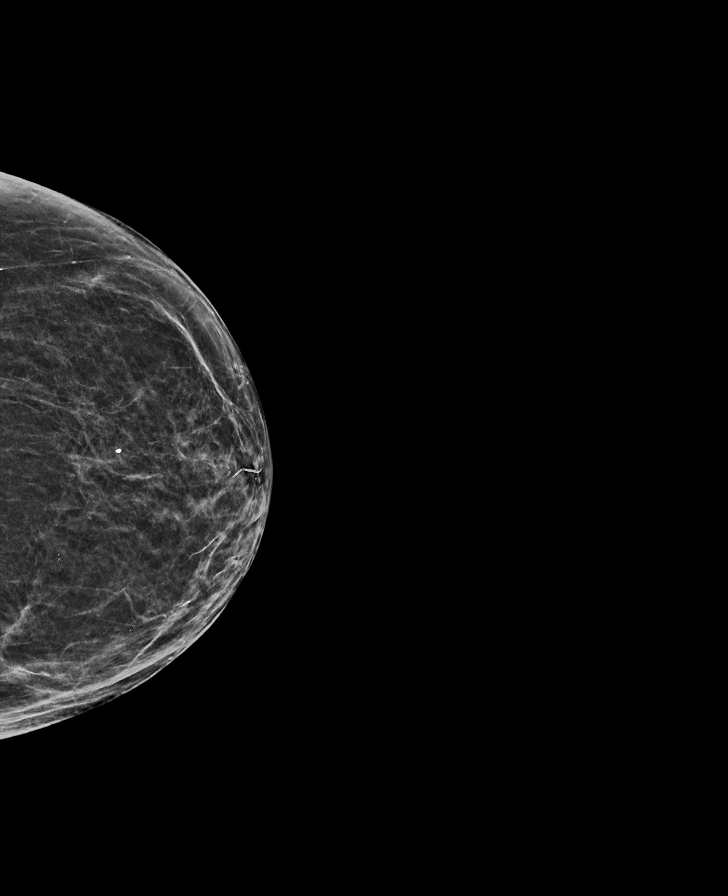

[L MLO synth-2D]
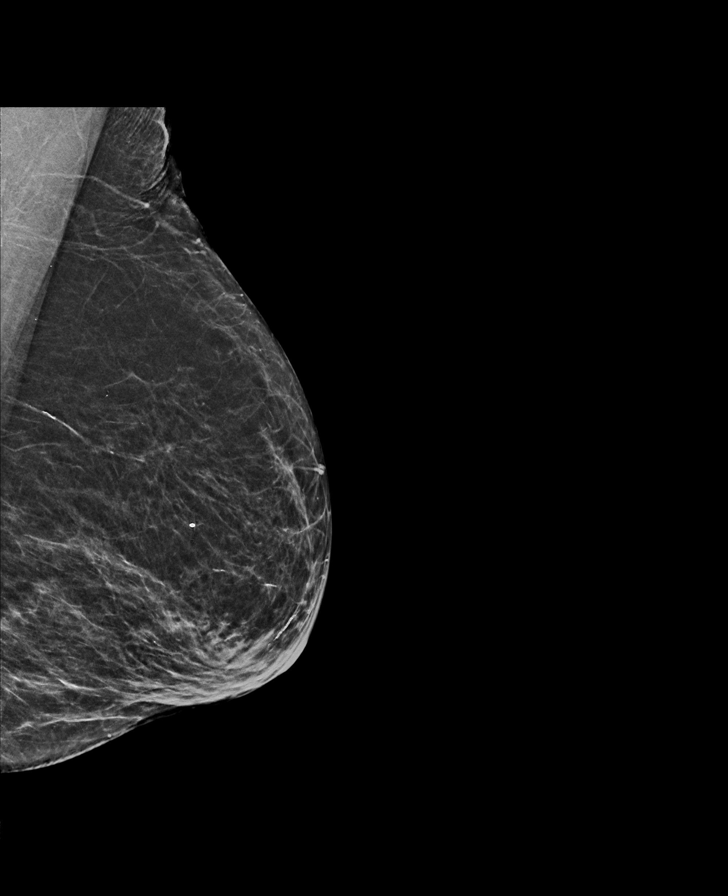

[R MLO]
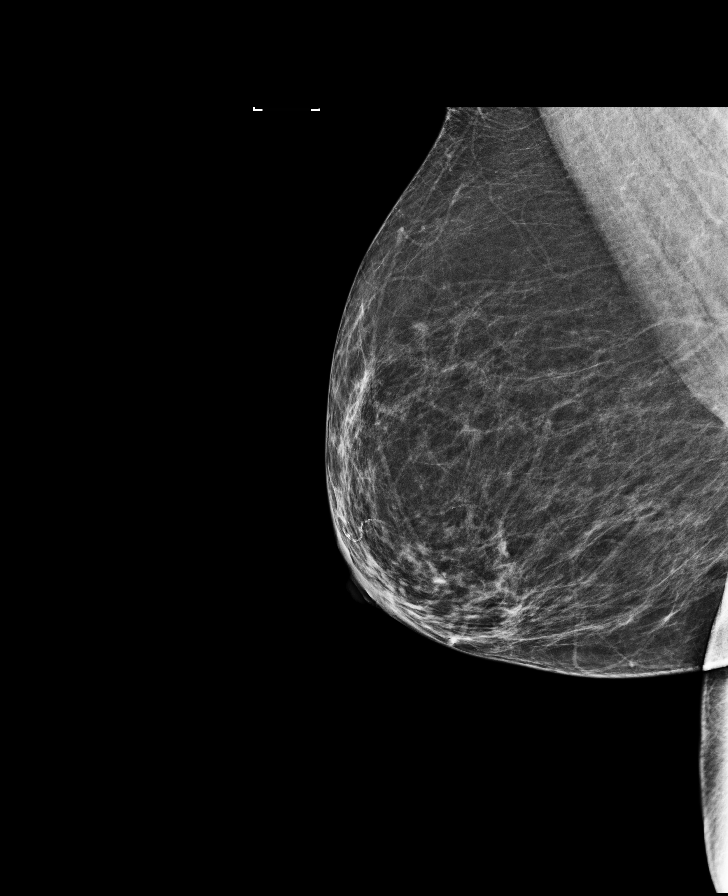

[R CC synth-2D]
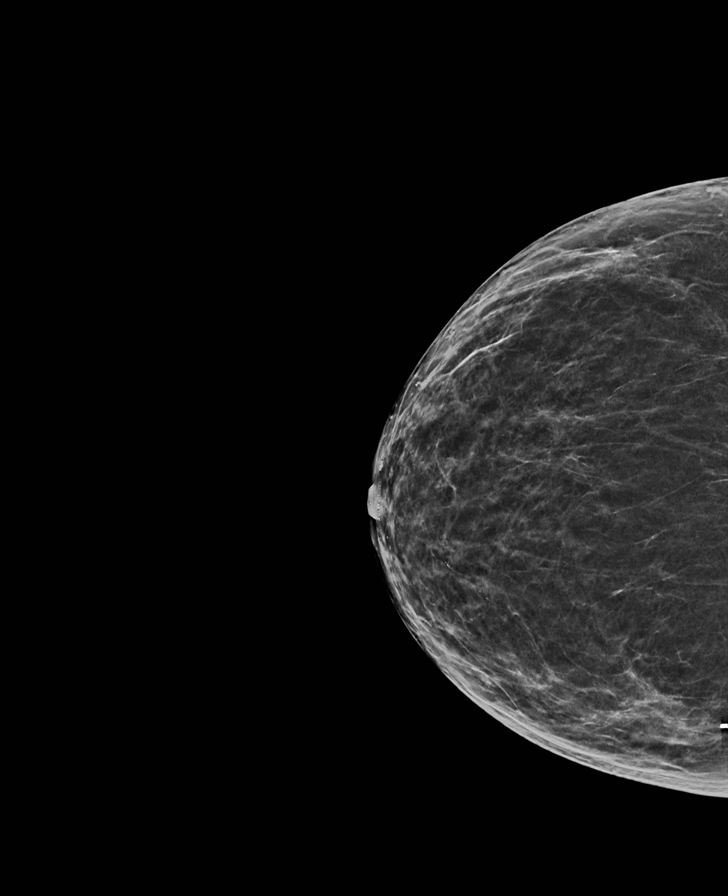

[L CC]
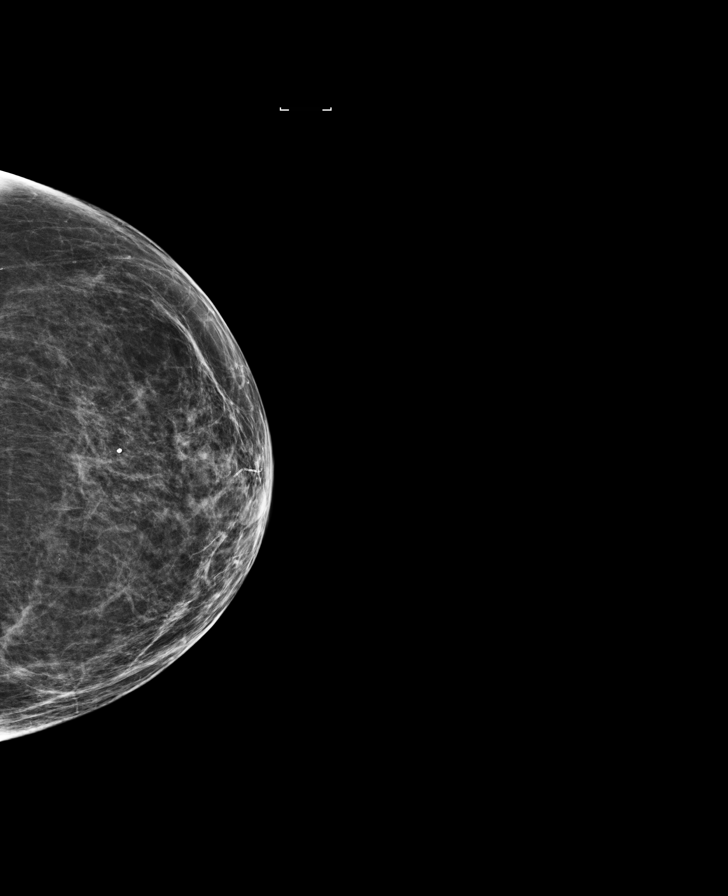

[8 of 28 positions shown; findings below may reference images not displayed]

ACR Breast Density Category b: There are scattered areas of
fibroglandular density.
FINDINGS: There are no findings suspicious for malignancy. Images were
processed with CAD.
IMPRESSION: No mammographic evidence of malignancy. A result letter of this
screening mammogram will be mailed directly to the patient.

RECOMMENDATION:
Screening mammogram in one year. (Code:55-L-23V)

BI-RADS CATEGORY  1: Negative.

## 2016-03-07 ENCOUNTER — Telehealth: Payer: Self-pay

## 2016-03-07 ENCOUNTER — Ambulatory Visit
Admission: RE | Admit: 2016-03-07 | Discharge: 2016-03-07 | Disposition: A | Discharge status: Home / Self Care | Payer: Commercial Managed Care - HMO | Source: Ambulatory Visit | Attending: Physician Assistant | Admitting: Physician Assistant

## 2016-03-07 DIAGNOSIS — Z780 Asymptomatic menopausal state: Secondary | ICD-10-CM

## 2016-03-07 DIAGNOSIS — M810 Age-related osteoporosis without current pathological fracture: Secondary | ICD-10-CM | POA: Insufficient documentation

## 2016-03-07 DIAGNOSIS — Z7983 Long term (current) use of bisphosphonates: Secondary | ICD-10-CM | POA: Insufficient documentation

## 2016-03-07 NOTE — Telephone Encounter (Signed)
-----   Message from Mar Daring, [place] sent at 03/07/2016 11:50 AM EDT ----- Bone density did not improve from previous bone density. May benefit by seeing specialist for consideration of injectable treatments for better benefit. Please let me know if you would like this or if you want to continue oral fosamax once weekly with calcium and Vit D supplement.

## 2016-03-07 NOTE — Telephone Encounter (Signed)
Pt agrees to referral. Renaldo Fiddler, CMA

## 2016-03-09 ENCOUNTER — Telehealth: Payer: Self-pay

## 2016-03-09 DIAGNOSIS — L97509 Non-pressure chronic ulcer of other part of unspecified foot with unspecified severity: Secondary | ICD-10-CM

## 2016-03-09 NOTE — Telephone Encounter (Signed)
Patient daughter called stating that her mom needs a new Podiatry referral. Patient is already an established patient of Dr. Elvina Mattes, but they required a new referral since it has been a year since the last referral order was placed.

## 2016-03-09 NOTE — Telephone Encounter (Signed)
Referral placed.

## 2016-03-27 DIAGNOSIS — L851 Acquired keratosis [keratoderma] palmaris et plantaris: Secondary | ICD-10-CM | POA: Diagnosis not present

## 2016-03-27 DIAGNOSIS — E1142 Type 2 diabetes mellitus with diabetic polyneuropathy: Secondary | ICD-10-CM | POA: Diagnosis not present

## 2016-03-27 DIAGNOSIS — B351 Tinea unguium: Secondary | ICD-10-CM | POA: Diagnosis not present

## 2016-08-01 DIAGNOSIS — I739 Peripheral vascular disease, unspecified: Secondary | ICD-10-CM | POA: Diagnosis not present

## 2016-08-01 DIAGNOSIS — L851 Acquired keratosis [keratoderma] palmaris et plantaris: Secondary | ICD-10-CM | POA: Diagnosis not present

## 2016-08-01 DIAGNOSIS — B351 Tinea unguium: Secondary | ICD-10-CM | POA: Diagnosis not present

## 2016-08-10 ENCOUNTER — Other Ambulatory Visit (INDEPENDENT_AMBULATORY_CARE_PROVIDER_SITE_OTHER): Payer: Self-pay | Admitting: Vascular Surgery

## 2016-08-10 DIAGNOSIS — I70213 Atherosclerosis of native arteries of extremities with intermittent claudication, bilateral legs: Secondary | ICD-10-CM

## 2016-08-10 DIAGNOSIS — Z95820 Peripheral vascular angioplasty status with implants and grafts: Secondary | ICD-10-CM

## 2016-08-14 ENCOUNTER — Ambulatory Visit (INDEPENDENT_AMBULATORY_CARE_PROVIDER_SITE_OTHER): Payer: Medicare HMO

## 2016-08-14 ENCOUNTER — Encounter (INDEPENDENT_AMBULATORY_CARE_PROVIDER_SITE_OTHER): Payer: Self-pay | Admitting: Vascular Surgery

## 2016-08-14 ENCOUNTER — Ambulatory Visit (INDEPENDENT_AMBULATORY_CARE_PROVIDER_SITE_OTHER): Payer: Medicare HMO | Admitting: Vascular Surgery

## 2016-08-14 VITALS — BP 140/73 | HR 71 | Resp 17 | Wt 154.0 lb

## 2016-08-14 DIAGNOSIS — I70213 Atherosclerosis of native arteries of extremities with intermittent claudication, bilateral legs: Secondary | ICD-10-CM

## 2016-08-14 DIAGNOSIS — Z959 Presence of cardiac and vascular implant and graft, unspecified: Secondary | ICD-10-CM

## 2016-08-14 DIAGNOSIS — I70219 Atherosclerosis of native arteries of extremities with intermittent claudication, unspecified extremity: Secondary | ICD-10-CM | POA: Insufficient documentation

## 2016-08-14 DIAGNOSIS — Z95820 Peripheral vascular angioplasty status with implants and grafts: Secondary | ICD-10-CM

## 2016-08-14 DIAGNOSIS — I7025 Atherosclerosis of native arteries of other extremities with ulceration: Secondary | ICD-10-CM | POA: Insufficient documentation

## 2016-08-14 DIAGNOSIS — I10 Essential (primary) hypertension: Secondary | ICD-10-CM

## 2016-08-14 DIAGNOSIS — M153 Secondary multiple arthritis: Secondary | ICD-10-CM | POA: Diagnosis not present

## 2016-08-14 NOTE — Progress Notes (Signed)
MRN : [id number]  Tracey Rogers is a 71 y.o. (30-Sep-1945) female who presents with chief complaint of  Chief Complaint  Patient presents with  . Follow-up  .  History of Present Illness: The patient is seen for follow up evaluation of atherosclerotic occlusive disease. All of her ulcers are healed. The patient denies changes in her claudication symptoms.  The patient denies interval amaurosis fugax. There is no recent history of TIA symptoms or focal motor deficits.  The patient denies interval development of abdominal or back pain. No new leg pain or leg symptoms. No new lower extremity pain or discoloration of the toes.  The patient has a history of coronary artery disease, no recent episodes of angina or shortness of breath. There is a history of hyperlipidemia which is being treated with a statin.  01/27/2015 CT angiography of the abdomen and pelvis shows all stents are patent and no iliofemoral hemodynamically significant stenosis is identified.  ABI's Rt=0.93 and Lt=0.94 previous ABI's Rt=0.92 and Lt=0.87 Duplex US of the lower extremity arterial system bilateral SFA stents are patent with moderate restenosis (right 159 cm/sec was 250 cm/sec and left 367 cm/sec was 401 cm/sec)of the proximal ends and bi-triphasic tibial signals  Current Meds  Medication Sig  . acetaminophen (TYLENOL) 325 MG tablet Take 650 mg by mouth every 6 (six) hours as needed.  Marland Kitchen alendronate (FOSAMAX) 70 MG tablet TAKE ONE TABLET EVERY WEEK AT LEAST 30 MINUTES BEFORE FOOD OR BEVERAGE WITH A LITTLE WATER  . amLODipine (NORVASC) 10 MG tablet TAKE ONE (1) TABLET EACH DAY  . aspirin EC 81 MG tablet Take 81 mg by mouth daily. In evening  . clopidogrel (PLAVIX) 75 MG tablet Take 75 mg by mouth daily.  . fluticasone (FLONASE) 50 MCG/ACT nasal spray Place 2 sprays into both nostrils daily. (Patient taking differently: Place 2 sprays into both nostrils daily. As needed)  . HYDROcodone-acetaminophen  (NORCO) 5-325 MG tablet Take 1-2 tablets by mouth every 4 (four) hours as needed for moderate pain.  Marland Kitchen loratadine (CLARITIN) 10 MG tablet Take 10 mg by mouth daily. As needed  . Magnesium 400 MG CAPS Take 400 mg by mouth every evening. As needed for leg cramps.  . triamcinolone cream (KENALOG) 0.1 % Apply 1 application topically 2 (two) times daily. As needed    Past Medical History:  Diagnosis Date  . Arthritis   . Complication of anesthesia    gets weak after surgery  . Constipation   . COPD (chronic obstructive pulmonary disease) (HCC)    mild  . Family history of adverse reaction to anesthesia    son gets postop nausea and vomiting, weak  . GERD (gastroesophageal reflux disease)   . Heart murmur   . Hypertension   . Peripheral vascular disease ([place])   . Psoriasis   . Psoriasis   . Skin ulcer of right great toe ([place])   . Ulcer of left ankle ([place])   . Wears dentures     Past Surgical History:  Procedure Laterality Date  . APPLICATION OF A-CELL OF EXTREMITY Left 04/13/2014   Procedure: PLACEMENT OF APPLICATION OF A-CELL ;  Surgeon: Theodoro Kos, DO;  Location: [place];  Service: Plastics;  Laterality: Left;  . APPLICATION OF A-CELL OF EXTREMITY Left 09/10/2014   Procedure: APPLICATION OF A-CELL OF EXTREMITY;  Surgeon: Theodoro Kos, DO;  Location: [place];  Service: Plastics;  Laterality: Left;  . CARDIOVASCULAR STRESS TEST  03-03-2014  dr Saralyn Pilar   normal lexi scan sestamibi study/  normal LVF without evidence for significant scar or ischemia  . CATARACT EXTRACTION W/ INTRAOCULAR LENS  IMPLANT, BILATERAL  2015  . CHOLECYSTECTOMY N/A 06/16/2015   Procedure: LAPAROSCOPIC CHOLECYSTECTOMY WITH CHOLANGIOGRAM;  Surgeon: Robert Bellow, MD;  Location: [facility];  Service: General;  Laterality: N/A;  . ENDOVASCULAR STENT INSERTION  sept  &  oct  2015   LEFT LEG STENTING--  common and superficial femoral artery  . ENDOVASCULAR STENT INSERTION   May 27, 2014   LifeStent bare metal stent left SFA  . HERNIA REPAIR  10/27/2015   Ventral hernia repaired with Ventrio ST mesh above previous umbilical port site  . I&D EXTREMITY Left 09/10/2014   Procedure: IRRIGATION AND DEBRIDEMENT LEFT ANKLE WOUND SURGICAL PREP ;  Surgeon: Theodoro Kos, DO;  Location: [place];  Service: Plastics;  Laterality: Left;  . INCISION AND DRAINAGE OF WOUND Bilateral 04/13/2014   Procedure: IRRIGATION AND DEBRIDEMENT OF LEFT ANKLE WOUND AND RIGHT FOOT;  Surgeon: Theodoro Kos, DO;  Location: [place];  Service: Plastics;  Laterality: Bilateral;  . TRANSTHORACIC ECHOCARDIOGRAM  03-03-2014   normal LVF/  ef 55-60%/  mild TI and MI  . VENTRAL HERNIA REPAIR N/A 10/27/2015   Procedure: HERNIA REPAIR VENTRAL ADULT;  Surgeon: Robert Bellow, MD;  Location: [facility];  Service: General;  Laterality: N/A;    Social History Social History  Substance Use Topics  . Smoking status: Former Smoker    Packs/day: 0.50    Years: 42.00    Types: Cigarettes    Quit date: 12/24/2013  . Smokeless tobacco: Former Systems developer     Comment: no smokers in her home  . Alcohol use No    Family History Family History  Problem Relation Age of Onset  . Kidney disease Mother   . Hypertension Mother   . Heart disease Mother   . Breast cancer Maternal Aunt 60  . Heart disease Sister   . Stroke Sister   No family history of bleeding/clotting disorders, porphyria or autoimmune disease   Allergies  Allergen Reactions  . Clarithromycin Rash    "Mycin"  . Methocarbamol Rash  . Minocin [Minocycline Hcl] Rash  . Other Rash    Optifoam applied to her left achilles wound.   . Tetracycline Rash  . Tetracyclines & Related Rash     REVIEW OF SYSTEMS (Negative unless checked)  Constitutional: [] Weight loss  [] Fever  [] Chills Cardiac: [] Chest pain   [] Chest pressure   [] Palpitations   [] Shortness of breath when laying flat   [] Shortness of breath  with exertion. Vascular:  [x] Pain in legs with walking   [] Pain in legs at rest  [] History of DVT   [] Phlebitis   [x] Swelling in legs   [] Varicose veins   [] Non-healing ulcers Pulmonary:   [] Uses home oxygen   [] Productive cough   [] Hemoptysis   [] Wheeze  [] COPD   [] Asthma Neurologic:  [] Dizziness   [] Seizures   [] History of stroke   [] History of TIA  [] Aphasia   [] Vissual changes   [] Weakness or numbness in arm   [] Weakness or numbness in leg Musculoskeletal:   [] Joint swelling   [] Joint pain   [] Low back pain Hematologic:  [] Easy bruising  [] Easy bleeding   [] Hypercoagulable state   [] Anemic Gastrointestinal:  [] Diarrhea   [] Vomiting  [] Gastroesophageal reflux/heartburn   [] Difficulty swallowing. Genitourinary:  [] Chronic kidney disease   [] Difficult urination  [] Frequent urination   []   Blood in urine Skin:  [] Rashes   [] Ulcers  Psychological:  [] History of anxiety   []  History of major depression.  Physical Examination  Vitals:   08/14/16 1039  BP: 140/73  Pulse: 71  Resp: 17  Weight: 154 lb (69.9 kg)   Body mass index is 26.43 kg/m. Gen: WD/WN, NAD Head: [place]/AT, No temporalis wasting.  Ear/Nose/Throat: Hearing grossly intact, nares w/o erythema or drainage, poor dentition Eyes: PER, EOMI, sclera nonicteric.  Neck: Supple, no masses.  No bruit or JVD.  Pulmonary:  Good air movement, clear to auscultation bilaterally, no use of accessory muscles.  Cardiac: RRR, normal S1, S2, no Murmurs. Vascular: Feet are pink and warm with brisk capillary refill bilaterally. Skin is intact. No ulcerations open wounds or sores Vessel Right Left  Radial Palpable Palpable  Ulnar Palpable Palpable  Brachial Palpable Palpable  Carotid Palpable Palpable  Femoral Palpable Palpable  Popliteal Palpable Palpable  PT 1+ Palpable Trace Palpable  DP 1+ Palpable Not Palpable   Gastrointestinal: soft, non-distended. No guarding/no peritoneal signs.  Musculoskeletal: M/S 5/5 throughout.  No deformity or  atrophy.  Neurologic: CN 2-12 intact. Pain and light touch intact in extremities.  Symmetrical.  Speech is fluent. Motor exam as listed above. Psychiatric: Judgment intact, Mood & affect appropriate for pt's clinical situation. Dermatologic: No rashes or ulcers noted.  No changes consistent with cellulitis. Lymph : No Cervical lymphadenopathy, no lichenification or skin changes of chronic lymphedema.  CBC Lab Results  Component Value Date   WBC 8.7 03/09/2015   HGB 12.5 03/09/2015   HCT 37.2 03/09/2015   MCV 89.4 03/09/2015   PLT 346 03/09/2015    BMET    Component Value Date/Time   NA 136 03/09/2015 1104   NA 128 (L) 05/27/2014 1253   K 4.3 03/09/2015 1104   K 4.2 05/27/2014 1253   CL 103 03/09/2015 1104   CL 97 (L) 05/27/2014 1253   CO2 24 03/09/2015 1104   CO2 25 05/27/2014 1253   GLUCOSE 99 03/09/2015 1104   GLUCOSE 84 05/27/2014 1253   BUN 9 03/09/2015 1104   BUN 9 05/27/2014 1253   CREATININE 0.76 03/09/2015 1104   CREATININE 0.89 05/27/2014 1253   CALCIUM 9.4 03/09/2015 1104   CALCIUM 9.1 05/27/2014 1253   GFRNONAA >60 03/09/2015 1104   GFRNONAA >60 05/27/2014 1253   GFRNONAA >60 02/06/2014 0724   GFRAA >60 03/09/2015 1104   GFRAA >60 05/27/2014 1253   GFRAA >60 02/06/2014 0724   CrCl cannot be calculated (Patient's most recent lab result is older than the maximum 21 days allowed.).  COAG Lab Results  Component Value Date   INR 0.9 03/18/2014    Radiology No results found.  Assessment/Plan 1. Atherosclerosis of native artery of both lower extremities with intermittent claudication (HCC)  Recommend:  The patient has evidence of atherosclerosis of the lower extremities with claudication.  The patient does not voice lifestyle limiting changes at this point in time.  Noninvasive studies do not suggest clinically significant change.  No invasive studies, angiography or surgery at this time The patient should continue walking and begin a more formal  exercise program.  The patient should continue antiplatelet therapy and aggressive treatment of the lipid abnormalities  No changes in the patient's medications at this time  The patient should continue wearing graduated compression socks 10-15 mmHg strength to control the mild edema.    2. Benign essential HTN Continue antihypertensive medications as already ordered, these medications have been  reviewed and there are no changes at this time.   3. Other secondary osteoarthritis of multiple sites Continue NSAID medications as already ordered, these medications have been reviewed and there are no changes at this time.     Hortencia Pilar, MD  08/14/2016 11:20 AM

## 2016-09-14 DIAGNOSIS — H43813 Vitreous degeneration, bilateral: Secondary | ICD-10-CM | POA: Diagnosis not present

## 2016-12-13 ENCOUNTER — Other Ambulatory Visit: Payer: Self-pay | Admitting: Physician Assistant

## 2016-12-13 DIAGNOSIS — I10 Essential (primary) hypertension: Secondary | ICD-10-CM

## 2016-12-26 ENCOUNTER — Telehealth: Payer: Self-pay | Admitting: Family Medicine

## 2016-12-26 NOTE — Telephone Encounter (Signed)
Left message to see if she can come at 9:30 on 02/28/17 prior to CPE with [facility] for AWV-ab

## 2017-01-25 ENCOUNTER — Telehealth: Payer: Self-pay | Admitting: Family Medicine

## 2017-02-06 ENCOUNTER — Telehealth: Payer: Self-pay | Admitting: Family Medicine

## 2017-02-15 ENCOUNTER — Ambulatory Visit (INDEPENDENT_AMBULATORY_CARE_PROVIDER_SITE_OTHER): Payer: Medicare HMO

## 2017-02-15 ENCOUNTER — Ambulatory Visit (INDEPENDENT_AMBULATORY_CARE_PROVIDER_SITE_OTHER): Payer: Medicare HMO | Admitting: Vascular Surgery

## 2017-02-15 ENCOUNTER — Encounter (INDEPENDENT_AMBULATORY_CARE_PROVIDER_SITE_OTHER): Payer: Self-pay | Admitting: Vascular Surgery

## 2017-02-15 VITALS — BP 140/62 | HR 63 | Resp 16 | Wt 156.0 lb

## 2017-02-15 DIAGNOSIS — I70213 Atherosclerosis of native arteries of extremities with intermittent claudication, bilateral legs: Secondary | ICD-10-CM

## 2017-02-15 DIAGNOSIS — I10 Essential (primary) hypertension: Secondary | ICD-10-CM | POA: Diagnosis not present

## 2017-02-15 NOTE — Progress Notes (Signed)
Subjective:    Patient ID: Tracey Rogers, female    DOB: 1945-11-25, 71 y.o.   MRN: [id number] Chief Complaint  Patient presents with  . Follow-up    70mo abi,ble art   Patient presents to review vascular studies. She was last seen on 08/14/2016. Patient presents today without complaint. She denies any claudication, rest pain or ulceration to her lower extremity. Patient underwent a bilateral lower extremity ABI which was notable for right ankle brachial index suggesting mild right lower extremity arterial disease. Left ankle brachial index suggesting mild left lower extremity arterial disease. Bilateral toe brachial indices are normal. Diffuse partially occlusive plaque throughout the bilateral lower extremity. Velocities suggesting 1-49% stenosis of the right superficial femoral artery and 50-99% stenosis of the left common femoral artery. Left lower extremity velocities suggesting 59 9% stenosis of the left common femoral artery and the superficial femoral artery of the left lower extremity these are slightly increased Doppler velocities in the common femoral artery deep while artery superficial femoral artery and popliteal artery bilaterally when compared to the last examination on 08/14/2016. Denies fever, nausea or vomiting.    Review of Systems  Constitutional: Negative.   HENT: Negative.   Eyes: Negative.   Respiratory: Negative.   Cardiovascular: Negative.   Gastrointestinal: Negative.   Endocrine: Negative.   Genitourinary: Negative.   Musculoskeletal: Negative.   Skin: Negative.   Allergic/Immunologic: Negative.   Neurological: Negative.   Hematological: Negative.   Psychiatric/Behavioral: Negative.        Objective:   Physical Exam  Constitutional: She is oriented to person, place, and time. She appears well-developed and well-nourished. No distress.  HENT:  Head: Normocephalic and atraumatic.  Eyes: Pupils are equal, round, and reactive to light. Conjunctivae are  normal.  Neck: Normal range of motion.  Cardiovascular: Normal rate, regular rhythm, normal heart sounds and intact distal pulses.   Pulses:      Radial pulses are 2+ on the right side, and 2+ on the left side.  Faint pedal pulses palpated bilaterally. Bilateral feet are warm.  Pulmonary/Chest: Effort normal.  Musculoskeletal: Normal range of motion. She exhibits no edema.  Neurological: She is alert and oriented to person, place, and time.  Skin: Skin is warm and dry. She is not diaphoretic.  Psychiatric: She has a normal mood and affect. Her behavior is normal. Judgment and thought content normal.  Vitals reviewed.   BP 140/62   Pulse 63   Resp 16   Wt 156 lb (70.8 kg)   BMI 26.78 kg/m   Past Medical History:  Diagnosis Date  . Arthritis   . Complication of anesthesia    gets weak after surgery  . Constipation   . COPD (chronic obstructive pulmonary disease) (HCC)    mild  . Family history of adverse reaction to anesthesia    son gets postop nausea and vomiting, weak  . GERD (gastroesophageal reflux disease)   . Heart murmur   . Hypertension   . Peripheral vascular disease ([place])   . Psoriasis   . Psoriasis   . Skin ulcer of right great toe ([place])   . Ulcer of left ankle ([place])   . Wears dentures     Social History   Social History  . Marital status: Married    Spouse name: N/A  . Number of children: N/A  . Years of education: N/A   Occupational History  . Not on file.   Social History Main Topics  . Smoking  status: Former Smoker    Packs/day: 0.50    Years: 42.00    Types: Cigarettes    Quit date: 12/24/2013  . Smokeless tobacco: Former Systems developer     Comment: no smokers in her home  . Alcohol use No  . Drug use: No  . Sexual activity: Not on file   Other Topics Concern  . Not on file   Social History Narrative  . No narrative on file    Past Surgical History:  Procedure Laterality Date  . APPLICATION OF A-CELL OF EXTREMITY Left 04/13/2014    Procedure: PLACEMENT OF APPLICATION OF A-CELL ;  Surgeon: Theodoro Kos, DO;  Location: [place];  Service: Plastics;  Laterality: Left;  . APPLICATION OF A-CELL OF EXTREMITY Left 09/10/2014   Procedure: APPLICATION OF A-CELL OF EXTREMITY;  Surgeon: Theodoro Kos, DO;  Location: [place];  Service: Plastics;  Laterality: Left;  . CARDIOVASCULAR STRESS TEST  03-03-2014  dr Saralyn Pilar   normal lexi scan sestamibi study/  normal LVF without evidence for significant scar or ischemia  . CATARACT EXTRACTION W/ INTRAOCULAR LENS  IMPLANT, BILATERAL  2015  . CHOLECYSTECTOMY N/A 06/16/2015   Procedure: LAPAROSCOPIC CHOLECYSTECTOMY WITH CHOLANGIOGRAM;  Surgeon: Robert Bellow, MD;  Location: [facility];  Service: General;  Laterality: N/A;  . ENDOVASCULAR STENT INSERTION  sept  &  oct  2015   LEFT LEG STENTING--  common and superficial femoral artery  . ENDOVASCULAR STENT INSERTION  May 27, 2014   LifeStent bare metal stent left SFA  . HERNIA REPAIR  10/27/2015   Ventral hernia repaired with Ventrio ST mesh above previous umbilical port site  . I&D EXTREMITY Left 09/10/2014   Procedure: IRRIGATION AND DEBRIDEMENT LEFT ANKLE WOUND SURGICAL PREP ;  Surgeon: Theodoro Kos, DO;  Location: [place];  Service: Plastics;  Laterality: Left;  . INCISION AND DRAINAGE OF WOUND Bilateral 04/13/2014   Procedure: IRRIGATION AND DEBRIDEMENT OF LEFT ANKLE WOUND AND RIGHT FOOT;  Surgeon: Theodoro Kos, DO;  Location: [place];  Service: Plastics;  Laterality: Bilateral;  . TRANSTHORACIC ECHOCARDIOGRAM  03-03-2014   normal LVF/  ef 55-60%/  mild TI and MI  . VENTRAL HERNIA REPAIR N/A 10/27/2015   Procedure: HERNIA REPAIR VENTRAL ADULT;  Surgeon: Robert Bellow, MD;  Location: [facility];  Service: General;  Laterality: N/A;    Family History  Problem Relation Age of Onset  . Kidney disease Mother   . Hypertension Mother   . Heart disease Mother     . Breast cancer Maternal Aunt 60  . Heart disease Sister   . Stroke Sister     Allergies  Allergen Reactions  . Clarithromycin Rash    "Mycin"  . Methocarbamol Rash  . Minocin [Minocycline Hcl] Rash  . Other Rash    Optifoam applied to her left achilles wound.   . Tetracycline Rash  . Tetracyclines & Related Rash       Assessment & Plan:  Patient presents to review vascular studies. She was last seen on 08/14/2016. Patient presents today without complaint. She denies any claudication, rest pain or ulceration to her lower extremity. Patient underwent a bilateral lower extremity ABI which was notable for right ankle brachial index suggesting mild right lower extremity arterial disease. Left ankle brachial index suggesting mild left lower extremity arterial disease. Bilateral toe brachial indices are normal. Diffuse partially occlusive plaque throughout the bilateral lower extremity. Velocities suggesting 1-49% stenosis of the right  superficial femoral artery and 50-99% stenosis of the left common femoral artery. Left lower extremity velocities suggesting 59 9% stenosis of the left common femoral artery and the superficial femoral artery of the left lower extremity these are slightly increased Doppler velocities in the common femoral artery deep while artery superficial femoral artery and popliteal artery bilaterally when compared to the last examination on 08/14/2016. Denies fever, nausea or vomiting.   1. Atherosclerosis of native artery of both lower extremities with intermittent claudication ([place]) - Stable Patient is currently asymptomatic at this time. She is not interested in moving forward with any interventions at this time. Patient will follow up in 6 months for repeat ABI and bilateral lower arterial duplex. Patient to call the office future if her symptoms worsen. Patient and daughter who was with her today are in agreement.  - VAS [place] ABI WITH/WO TBI; Future - VAS [place] LOWER  EXTREMITY ARTERIAL DUPLEX; Future  2. Essential hypertension - Stable Encouraged good control as its slows the progression of atherosclerotic disease  Current Outpatient Prescriptions on File Prior to Visit  Medication Sig Dispense Refill  . acetaminophen (TYLENOL) 325 MG tablet Take 650 mg by mouth every 6 (six) hours as needed.    Marland Kitchen alendronate (FOSAMAX) 70 MG tablet TAKE ONE TABLET EVERY WEEK AT LEAST 30 MINUTES BEFORE FOOD OR BEVERAGE WITH A LITTLE WATER 12 tablet 3  . amLODipine (NORVASC) 10 MG tablet TAKE 1 TABLET BY MOUTH EVERY DAY. 90 tablet 1  . aspirin EC 81 MG tablet Take 81 mg by mouth daily. In evening    . clopidogrel (PLAVIX) 75 MG tablet TAKE 1 TABLET BY MOUTH ONCE A DAY 90 tablet 1  . fluticasone (FLONASE) 50 MCG/ACT nasal spray Place 2 sprays into both nostrils daily. (Patient taking differently: Place 2 sprays into both nostrils daily. As needed) 16 g 2  . HYDROcodone-acetaminophen (NORCO) 5-325 MG tablet Take 1-2 tablets by mouth every 4 (four) hours as needed for moderate pain. 30 tablet 0  . loratadine (CLARITIN) 10 MG tablet Take 10 mg by mouth daily. As needed    . Magnesium 400 MG CAPS Take 400 mg by mouth every evening. As needed for leg cramps.    . triamcinolone cream (KENALOG) 0.1 % Apply 1 application topically 2 (two) times daily. As needed     No current facility-administered medications on file prior to visit.     There are no Patient Instructions on file for this visit. No Follow-up on file.   Derreck Wiltsey A Chanay Nugent, PA-C

## 2017-02-28 ENCOUNTER — Telehealth: Payer: Self-pay | Admitting: Physician Assistant

## 2017-02-28 ENCOUNTER — Encounter: Payer: Self-pay | Admitting: Physician Assistant

## 2017-02-28 ENCOUNTER — Ambulatory Visit (INDEPENDENT_AMBULATORY_CARE_PROVIDER_SITE_OTHER): Payer: Medicare HMO | Admitting: Physician Assistant

## 2017-02-28 VITALS — BP 160/60 | HR 66 | Temp 97.7°F | Resp 16 | Ht 64.0 in | Wt 157.8 lb

## 2017-02-28 DIAGNOSIS — Z0000 Encounter for general adult medical examination without abnormal findings: Secondary | ICD-10-CM

## 2017-02-28 DIAGNOSIS — F324 Major depressive disorder, single episode, in partial remission: Secondary | ICD-10-CM | POA: Diagnosis not present

## 2017-02-28 DIAGNOSIS — Z2821 Immunization not carried out because of patient refusal: Secondary | ICD-10-CM

## 2017-02-28 DIAGNOSIS — Z1239 Encounter for other screening for malignant neoplasm of breast: Secondary | ICD-10-CM

## 2017-02-28 DIAGNOSIS — Z1211 Encounter for screening for malignant neoplasm of colon: Secondary | ICD-10-CM

## 2017-02-28 DIAGNOSIS — Z1231 Encounter for screening mammogram for malignant neoplasm of breast: Secondary | ICD-10-CM

## 2017-02-28 DIAGNOSIS — I10 Essential (primary) hypertension: Secondary | ICD-10-CM

## 2017-02-28 DIAGNOSIS — J449 Chronic obstructive pulmonary disease, unspecified: Secondary | ICD-10-CM | POA: Diagnosis not present

## 2017-02-28 DIAGNOSIS — Z716 Tobacco abuse counseling: Secondary | ICD-10-CM

## 2017-02-28 DIAGNOSIS — I70213 Atherosclerosis of native arteries of extremities with intermittent claudication, bilateral legs: Secondary | ICD-10-CM | POA: Diagnosis not present

## 2017-02-28 DIAGNOSIS — Z780 Asymptomatic menopausal state: Secondary | ICD-10-CM | POA: Diagnosis not present

## 2017-02-28 DIAGNOSIS — L97519 Non-pressure chronic ulcer of other part of right foot with unspecified severity: Secondary | ICD-10-CM | POA: Diagnosis not present

## 2017-02-28 DIAGNOSIS — L97321 Non-pressure chronic ulcer of left ankle limited to breakdown of skin: Secondary | ICD-10-CM

## 2017-02-28 DIAGNOSIS — M810 Age-related osteoporosis without current pathological fracture: Secondary | ICD-10-CM

## 2017-02-28 LAB — COMPLETE METABOLIC PANEL WITH GFR
AG Ratio: 1.4 (calc) (ref 1.0–2.5)
ALT: 11 U/L (ref 6–29)
AST: 17 U/L (ref 10–35)
Albumin: 4.4 g/dL (ref 3.6–5.1)
Alkaline phosphatase (APISO): 64 U/L (ref 33–130)
BUN: 9 mg/dL (ref 7–25)
CALCIUM: 9.4 mg/dL (ref 8.6–10.4)
CO2: 26 mmol/L (ref 20–32)
CREATININE: 0.82 mg/dL (ref 0.60–0.93)
Chloride: 106 mmol/L (ref 98–110)
GFR, EST AFRICAN AMERICAN: 84 mL/min/{1.73_m2} (ref >=60)
GFR, EST NON AFRICAN AMERICAN: 72 mL/min/{1.73_m2} (ref >=60)
GLUCOSE: 111 mg/dL — AB (ref 65–99)
Globulin: 3.2 g/dL (calc) (ref 1.9–3.7)
Potassium: 4.4 mmol/L (ref 3.5–5.3)
SODIUM: 138 mmol/L (ref 135–146)
TOTAL PROTEIN: 7.6 g/dL (ref 6.1–8.1)
Total Bilirubin: 0.6 mg/dL (ref 0.2–1.2)

## 2017-02-28 LAB — LIPID PANEL
CHOL/HDL RATIO: 5.4 (calc) — AB (ref <5.0)
Cholesterol: 209 mg/dL — ABNORMAL HIGH (ref <200)
HDL: 39 mg/dL — AB (ref >50)
LDL Cholesterol (Calc): 149 mg/dL (calc) — ABNORMAL HIGH
NON-HDL CHOLESTEROL (CALC): 170 mg/dL — AB (ref <130)
Triglycerides: 99 mg/dL (ref <150)

## 2017-02-28 LAB — CBC WITH DIFFERENTIAL/PLATELET
BASOS ABS: 135 {cells}/uL (ref 0–200)
Basophils Relative: 1.3 %
EOS ABS: 426 {cells}/uL (ref 15–500)
EOS PCT: 4.1 %
HEMATOCRIT: 39 % (ref 35.0–45.0)
HEMOGLOBIN: 13.3 g/dL (ref 11.7–15.5)
LYMPHS ABS: 1570 {cells}/uL (ref 850–3900)
MCH: 30.1 pg (ref 27.0–33.0)
MCHC: 34.1 g/dL (ref 32.0–36.0)
MCV: 88.2 fL (ref 80.0–100.0)
MPV: 11.2 fL (ref 7.5–12.5)
Monocytes Relative: 7.1 %
NEUTROS ABS: 7530 {cells}/uL (ref 1500–7800)
NEUTROS PCT: 72.4 %
PLATELETS: 283 10*3/uL (ref 140–400)
RBC: 4.42 10*6/uL (ref 3.80–5.10)
RDW: 13 % (ref 11.0–15.0)
Total Lymphocyte: 15.1 %
WBC mixed population: 738 cells/uL (ref 200–950)
WBC: 10.4 10*3/uL (ref 3.8–10.8)

## 2017-02-28 MED ORDER — CLOPIDOGREL BISULFATE 75 MG PO TABS: 75.0000 mg | ORAL_TABLET | Freq: Every day | ORAL | 3 refills | Status: DC

## 2017-02-28 MED ORDER — AMLODIPINE BESYLATE 10 MG PO TABS: 10.0000 mg | ORAL_TABLET | Freq: Every day | ORAL | 3 refills | Status: DC

## 2017-02-28 MED ORDER — SERTRALINE HCL 25 MG PO TABS: 25.0000 mg | ORAL_TABLET | Freq: Every day | ORAL | 1 refills | Status: DC

## 2017-02-28 NOTE — Patient Instructions (Signed)
Health Maintenance for Postmenopausal Women Menopause is a normal process in which your reproductive ability comes to an end. This process happens gradually over a span of months to years, usually between the ages of 22 and 9. Menopause is complete when you have missed 12 consecutive menstrual periods. It is important to talk with your health care provider about some of the most common conditions that affect postmenopausal women, such as heart disease, cancer, and bone loss (osteoporosis). Adopting a healthy lifestyle and getting preventive care can help to promote your health and wellness. Those actions can also lower your chances of developing some of these common conditions. What should I know about menopause? During menopause, you may experience a number of symptoms, such as:  Moderate-to-severe hot flashes.  Night sweats.  Decrease in sex drive.  Mood swings.  Headaches.  Tiredness.  Irritability.  Memory problems.  Insomnia.  Choosing to treat or not to treat menopausal changes is an individual decision that you make with your health care provider. What should I know about hormone replacement therapy and supplements? Hormone therapy products are effective for treating symptoms that are associated with menopause, such as hot flashes and night sweats. Hormone replacement carries certain risks, especially as you become older. If you are thinking about using estrogen or estrogen with progestin treatments, discuss the benefits and risks with your health care provider. What should I know about heart disease and stroke? Heart disease, heart attack, and stroke become more likely as you age. This may be due, in part, to the hormonal changes that your body experiences during menopause. These can affect how your body processes dietary fats, triglycerides, and cholesterol. Heart attack and stroke are both medical emergencies. There are many things that you can do to help prevent heart disease  and stroke:  Have your blood pressure checked at least every 1-2 years. High blood pressure causes heart disease and increases the risk of stroke.  If you are 53-22 years old, ask your health care provider if you should take aspirin to prevent a heart attack or a stroke.  Do not use any tobacco products, including cigarettes, chewing tobacco, or electronic cigarettes. If you need help quitting, ask your health care provider.  It is important to eat a healthy diet and maintain a healthy weight. ? Be sure to include plenty of vegetables, fruits, low-fat dairy products, and lean protein. ? Avoid eating foods that are high in solid fats, added sugars, or salt (sodium).  Get regular exercise. This is one of the most important things that you can do for your health. ? Try to exercise for at least 150 minutes each week. The type of exercise that you do should increase your heart rate and make you sweat. This is known as moderate-intensity exercise. ? Try to do strengthening exercises at least twice each week. Do these in addition to the moderate-intensity exercise.  Know your numbers.Ask your health care provider to check your cholesterol and your blood glucose. Continue to have your blood tested as directed by your health care provider.  What should I know about cancer screening? There are several types of cancer. Take the following steps to reduce your risk and to catch any cancer development as early as possible. Breast Cancer  Practice breast self-awareness. ? This means understanding how your breasts normally appear and feel. ? It also means doing regular breast self-exams. Let your health care provider know about any changes, no matter how small.  If you are 40  or older, have a clinician do a breast exam (clinical breast exam or CBE) every year. Depending on your age, family history, and medical history, it may be recommended that you also have a yearly breast X-ray (mammogram).  If you  have a family history of breast cancer, talk with your health care provider about genetic screening.  If you are at high risk for breast cancer, talk with your health care provider about having an MRI and a mammogram every year.  Breast cancer (BRCA) gene test is recommended for women who have family members with BRCA-related cancers. Results of the assessment will determine the need for genetic counseling and BRCA1 and for BRCA2 testing. BRCA-related cancers include these types: ? Breast. This occurs in males or females. ? Ovarian. ? Tubal. This may also be called fallopian tube cancer. ? Cancer of the abdominal or pelvic lining (peritoneal cancer). ? Prostate. ? Pancreatic.  Cervical, Uterine, and Ovarian Cancer Your health care provider may recommend that you be screened regularly for cancer of the pelvic organs. These include your ovaries, uterus, and vagina. This screening involves a pelvic exam, which includes checking for microscopic changes to the surface of your cervix (Pap test).  For women ages 21-65, health care providers may recommend a pelvic exam and a Pap test every three years. For women ages 79-65, they may recommend the Pap test and pelvic exam, combined with testing for human papilloma virus (HPV), every five years. Some types of HPV increase your risk of cervical cancer. Testing for HPV may also be done on women of any age who have unclear Pap test results.  Other health care providers may not recommend any screening for nonpregnant women who are considered low risk for pelvic cancer and have no symptoms. Ask your health care provider if a screening pelvic exam is right for you.  If you have had past treatment for cervical cancer or a condition that could lead to cancer, you need Pap tests and screening for cancer for at least 20 years after your treatment. If Pap tests have been discontinued for you, your risk factors (such as having a new sexual partner) need to be  reassessed to determine if you should start having screenings again. Some women have medical problems that increase the chance of getting cervical cancer. In these cases, your health care provider may recommend that you have screening and Pap tests more often.  If you have a family history of uterine cancer or ovarian cancer, talk with your health care provider about genetic screening.  If you have vaginal bleeding after reaching menopause, tell your health care provider.  There are currently no reliable tests available to screen for ovarian cancer.  Lung Cancer Lung cancer screening is recommended for adults 69-62 years old who are at high risk for lung cancer because of a history of smoking. A yearly low-dose CT scan of the lungs is recommended if you:  Currently smoke.  Have a history of at least 30 pack-years of smoking and you currently smoke or have quit within the past 15 years. A pack-year is smoking an average of one pack of cigarettes per day for one year.  Yearly screening should:  Continue until it has been 15 years since you quit.  Stop if you develop a health problem that would prevent you from having lung cancer treatment.  Colorectal Cancer  This type of cancer can be detected and can often be prevented.  Routine colorectal cancer screening usually begins at  age 42 and continues through age 45.  If you have risk factors for colon cancer, your health care provider may recommend that you be screened at an earlier age.  If you have a family history of colorectal cancer, talk with your health care provider about genetic screening.  Your health care provider may also recommend using home test kits to check for hidden blood in your stool.  A small camera at the end of a tube can be used to examine your colon directly (sigmoidoscopy or colonoscopy). This is done to check for the earliest forms of colorectal cancer.  Direct examination of the colon should be repeated every  5-10 years until age 71. However, if early forms of precancerous polyps or small growths are found or if you have a family history or genetic risk for colorectal cancer, you may need to be screened more often.  Skin Cancer  Check your skin from head to toe regularly.  Monitor any moles. Be sure to tell your health care provider: ? About any new moles or changes in moles, especially if there is a change in a mole's shape or color. ? If you have a mole that is larger than the size of a pencil eraser.  If any of your family members has a history of skin cancer, especially at a young age, talk with your health care provider about genetic screening.  Always use sunscreen. Apply sunscreen liberally and repeatedly throughout the day.  Whenever you are outside, protect yourself by wearing long sleeves, pants, a wide-brimmed hat, and sunglasses.  What should I know about osteoporosis? Osteoporosis is a condition in which bone destruction happens more quickly than new bone creation. After menopause, you may be at an increased risk for osteoporosis. To help prevent osteoporosis or the bone fractures that can happen because of osteoporosis, the following is recommended:  If you are 46-71 years old, get at least 1,000 mg of calcium and at least 600 mg of vitamin D per day.  If you are older than age 55 but younger than age 65, get at least 1,200 mg of calcium and at least 600 mg of vitamin D per day.  If you are older than age 54, get at least 1,200 mg of calcium and at least 800 mg of vitamin D per day.  Smoking and excessive alcohol intake increase the risk of osteoporosis. Eat foods that are rich in calcium and vitamin D, and do weight-bearing exercises several times each week as directed by your health care provider. What should I know about how menopause affects my mental health? Depression may occur at any age, but it is more common as you become older. Common symptoms of depression  include:  Low or sad mood.  Changes in sleep patterns.  Changes in appetite or eating patterns.  Feeling an overall lack of motivation or enjoyment of activities that you previously enjoyed.  Frequent crying spells.  Talk with your health care provider if you think that you are experiencing depression. What should I know about immunizations? It is important that you get and maintain your immunizations. These include:  Tetanus, diphtheria, and pertussis (Tdap) booster vaccine.  Influenza every year before the flu season begins.  Pneumonia vaccine.  Shingles vaccine.  Your health care provider may also recommend other immunizations. This information is not intended to replace advice given to you by your health care provider. Make sure you discuss any questions you have with your health care provider. Document Released: 07/07/2005  Document Revised: 12/03/2015 Document Reviewed: 02/16/2015 Elsevier Interactive Patient Education  2018 [employer]  

## 2017-02-28 NOTE — Telephone Encounter (Signed)
Order for cologuard faxed to [facility] °

## 2017-02-28 NOTE — Progress Notes (Signed)
Patient: Tracey Rogers, Female    DOB: Apr 13, 1946, 71 y.o.   MRN: [id number] Visit Date: 02/28/2017  Today's Provider: Mar Daring, PA-C   Chief Complaint  Patient presents with  . Medicare Wellness   Subjective:    Annual wellness visit Tracey Rogers is a 71 y.o. female. She feels well. She reports exercising none. She reports she is sleeping well.  Last AWE:02/23/16 BMD:03/07/16-Osteoporosis Mammogram:12/13/15 BI-RADS 1 Colonoscopy: Refused on 02/13/16-need a colon cancer screening -----------------------------------------------------------   Review of Systems  Constitutional: Negative.   HENT: Positive for hearing loss.   Eyes: Negative.   Respiratory: Positive for cough.   Cardiovascular: Negative.   Gastrointestinal: Negative.   Endocrine: Positive for cold intolerance.  Genitourinary: Negative.   Musculoskeletal: Positive for arthralgias, gait problem and joint swelling.  Skin: Negative.   Allergic/Immunologic: Negative.   Hematological: Bruises/bleeds easily.  Psychiatric/Behavioral: Positive for agitation.    Social History   Social History  . Marital status: Married    Spouse name: N/A  . Number of children: N/A  . Years of education: N/A   Occupational History  . Not on file.   Social History Main Topics  . Smoking status: Former Smoker    Packs/day: 0.50    Years: 42.00    Types: Cigarettes    Quit date: 12/24/2013  . Smokeless tobacco: Former Systems developer     Comment: no smokers in her home  . Alcohol use No  . Drug use: No  . Sexual activity: Not on file   Other Topics Concern  . Not on file   Social History Narrative  . No narrative on file    Past Medical History:  Diagnosis Date  . Arthritis   . Complication of anesthesia    gets weak after surgery  . Constipation   . COPD (chronic obstructive pulmonary disease) (HCC)    mild  . Family history of adverse reaction to anesthesia    son gets postop nausea  and vomiting, weak  . GERD (gastroesophageal reflux disease)   . Heart murmur   . Hypertension   . Peripheral vascular disease ([place])   . Psoriasis   . Psoriasis   . Skin ulcer of right great toe ([place])   . Ulcer of left ankle ([place])   . Wears dentures      Patient Active Problem List   Diagnosis Date Noted  . Atherosclerosis of native arteries of extremity with intermittent claudication ([place]) 08/14/2016  . Atherosclerosis 10/20/2015  . Ventral hernia without obstruction or gangrene 10/05/2015  . 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 Gallstones 02/19/2015  . History of alcohol use disorder 09/22/2014  . Allergic rhinitis 09/22/2014  . Chronic constipation 09/22/2014  . History of repeated overdose 09/22/2014  . Below normal amount of sodium in the blood 09/22/2014  . Depression, major, single episode 09/22/2014  . Arthritis, degenerative 09/22/2014  . OP (osteoporosis) 09/22/2014  . Psoriasis 09/22/2014  . Foot ulcer ([place]) 04/10/2014  . History of tobacco abuse 02/16/2014  . Ankle ulcer ([place]) 02/16/2014  . CAFL (chronic airflow limitation) ([place]) 02/16/2014  . Essential hypertension 02/16/2014  . 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 Leg ulcer ([place]) 02/10/2014    Past Surgical History:  Procedure Laterality Date  . APPLICATION OF A-CELL OF EXTREMITY Left 04/13/2014   Procedure: PLACEMENT OF APPLICATION OF A-CELL ;  Surgeon: Theodoro Kos, DO;  Location: [place];  Service: Plastics;  Laterality: Left;  . APPLICATION  OF A-CELL OF EXTREMITY Left 09/10/2014   Procedure: APPLICATION OF A-CELL OF EXTREMITY;  Surgeon: Theodoro Kos, DO;  Location: [place];  Service: Plastics;  Laterality: Left;  . CARDIOVASCULAR STRESS TEST  03-03-2014  dr Saralyn Pilar   normal lexi scan sestamibi study/  normal LVF without evidence for significant scar or ischemia  . CATARACT EXTRACTION W/ INTRAOCULAR LENS  IMPLANT, BILATERAL  2015  . CHOLECYSTECTOMY  N/A 06/16/2015   Procedure: LAPAROSCOPIC CHOLECYSTECTOMY WITH CHOLANGIOGRAM;  Surgeon: Robert Bellow, MD;  Location: [facility];  Service: General;  Laterality: N/A;  . ENDOVASCULAR STENT INSERTION  sept  &  oct  2015   LEFT LEG STENTING--  common and superficial femoral artery  . ENDOVASCULAR STENT INSERTION  May 27, 2014   LifeStent bare metal stent left SFA  . HERNIA REPAIR  10/27/2015   Ventral hernia repaired with Ventrio ST mesh above previous umbilical port site  . I&D EXTREMITY Left 09/10/2014   Procedure: IRRIGATION AND DEBRIDEMENT LEFT ANKLE WOUND SURGICAL PREP ;  Surgeon: Theodoro Kos, DO;  Location: [place];  Service: Plastics;  Laterality: Left;  . INCISION AND DRAINAGE OF WOUND Bilateral 04/13/2014   Procedure: IRRIGATION AND DEBRIDEMENT OF LEFT ANKLE WOUND AND RIGHT FOOT;  Surgeon: Theodoro Kos, DO;  Location: [place];  Service: Plastics;  Laterality: Bilateral;  . TRANSTHORACIC ECHOCARDIOGRAM  03-03-2014   normal LVF/  ef 55-60%/  mild TI and MI  . VENTRAL HERNIA REPAIR N/A 10/27/2015   Procedure: HERNIA REPAIR VENTRAL ADULT;  Surgeon: Robert Bellow, MD;  Location: [facility];  Service: General;  Laterality: N/A;    Her family history includes Breast cancer (age of onset: 64) in her maternal aunt; Heart disease in her mother and sister; Hypertension in her mother; Kidney disease in her mother; Stroke in her sister.      Current Outpatient Prescriptions:  .  acetaminophen (TYLENOL) 325 MG tablet, Take 650 mg by mouth every 6 (six) hours as needed., Disp: , Rfl:  .  alendronate (FOSAMAX) 70 MG tablet, TAKE ONE TABLET EVERY WEEK AT LEAST 30 MINUTES BEFORE FOOD OR BEVERAGE WITH A LITTLE WATER, Disp: 12 tablet, Rfl: 3 .  amLODipine (NORVASC) 10 MG tablet, TAKE 1 TABLET BY MOUTH EVERY DAY., Disp: 90 tablet, Rfl: 1 .  aspirin EC 81 MG tablet, Take 81 mg by mouth daily. In evening, Disp: , Rfl:  .  clopidogrel (PLAVIX) 75 MG tablet, TAKE  1 TABLET BY MOUTH ONCE A DAY, Disp: 90 tablet, Rfl: 1 .  fluticasone (FLONASE) 50 MCG/ACT nasal spray, Place 2 sprays into both nostrils daily. (Patient taking differently: Place 2 sprays into both nostrils daily. As needed), Disp: 16 g, Rfl: 2 .  HYDROcodone-acetaminophen (NORCO) 5-325 MG tablet, Take 1-2 tablets by mouth every 4 (four) hours as needed for moderate pain., Disp: 30 tablet, Rfl: 0 .  loratadine (CLARITIN) 10 MG tablet, Take 10 mg by mouth daily. As needed, Disp: , Rfl:  .  Magnesium 400 MG CAPS, Take 400 mg by mouth every evening. As needed for leg cramps., Disp: , Rfl:  .  triamcinolone cream (KENALOG) 0.1 %, Apply 1 application topically 2 (two) times daily. As needed, Disp: , Rfl:   Patient Care Team: Margarita Rana, MD as PCP - General (Family Medicine) Margarita Rana, MD as Referring Physician (Family Medicine) Bary Castilla Forest Gleason, MD (General Surgery)     Objective:   Vitals: BP (!) 160/60 (BP Location: Right Arm,  Patient Position: Sitting, Cuff Size: Normal)   Pulse 66   Temp 97.7 F (36.5 C) (Oral)   Resp 16   Ht 5\' 4"  (1.626 m)   Wt 157 lb 12.8 oz (71.6 kg)   BMI 27.09 kg/m   Physical Exam  Constitutional: She is oriented to person, place, and time. She appears well-developed and well-nourished. No distress.  HENT:  Head: Normocephalic and atraumatic.  Right Ear: Hearing, tympanic membrane, external ear and ear canal normal.  Left Ear: Hearing, tympanic membrane, external ear and ear canal normal.  Nose: Nose normal.  Mouth/Throat: Uvula is midline, oropharynx is clear and moist and mucous membranes are normal. No oropharyngeal exudate.  Eyes: Pupils are equal, round, and reactive to light. Conjunctivae and EOM are normal. Right eye exhibits no discharge. Left eye exhibits no discharge. No scleral icterus.  Neck: Normal range of motion. Neck supple. No JVD present. Carotid bruit is not present. No tracheal deviation present. No thyromegaly present.    Cardiovascular: Normal rate, regular rhythm, normal heart sounds and intact distal pulses.  Exam reveals no gallop and no friction rub.   No murmur heard. Pulmonary/Chest: Effort normal and breath sounds normal. No respiratory distress. She has no wheezes. She has no rales. She exhibits no tenderness.  Abdominal: Soft. Bowel sounds are normal. She exhibits no distension and no mass. There is no tenderness. There is no rebound and no guarding.  Musculoskeletal: Normal range of motion. She exhibits no edema or tenderness.  Lymphadenopathy:    She has no cervical adenopathy.  Neurological: She is alert and oriented to person, place, and time. She has normal reflexes.  Skin: Skin is warm and dry. No rash noted. She is not diaphoretic.  Psychiatric: She has a normal mood and affect. Her behavior is normal. Judgment and thought content normal.  Vitals reviewed.   Activities of Daily Living In your present state of health, do you have any difficulty performing the following activities: 02/28/2017  Hearing? Y  Vision? N  Difficulty concentrating or making decisions? Y  Walking or climbing stairs? Y  Dressing or bathing? N  Doing errands, shopping? Y  Some recent data might be hidden    Fall Risk Assessment Fall Risk  02/28/2017 02/23/2016 02/19/2015  Falls in the past year? No No Yes  Injury with Fall? - - Yes     Depression Screen PHQ 2/9 Scores 02/28/2017 02/23/2016 02/19/2015  PHQ - 2 Score 0 0 0    Cognitive Testing - 6-CIT  Correct? Score   What year is it? yes 0 0 or 4  What month is it? yes 0 0 or 3  Memorize:    Pia Mau,  42,  [address]  [place],      What time is it? (within 1 hour) yes 0 0 or 3  Count backwards from 20 yes 0 0, 2, or 4  Name the months of the year yes 0 0, 2, or 4  Repeat name & address above no 2 0, 2, 4, 6, 8, or 10       TOTAL SCORE  02/28   Interpretation:  Normal  Normal (0-7) Abnormal (8-28)       Assessment & Plan:     Annual Wellness  Visit  Reviewed patient's Family Medical History Reviewed and updated list of patient's medical providers Assessment of cognitive impairment was done Assessed patient's functional ability Established a written schedule for health screening [place] Completed and Reviewed  Exercise Activities and Dietary recommendations Goals    None      Immunization History  Administered Date(s) Administered  . Pneumococcal Conjugate-13 02/19/2015  . Pneumococcal Polysaccharide-23 02/23/2016    Health Maintenance  Topic Date Due  . Hepatitis C Screening  [birth year]  . TETANUS/TDAP  03/02/1965  . COLONOSCOPY  03/02/1996  . INFLUENZA VACCINE  08/26/2017 (Originally 12/27/2016)  . MAMMOGRAM  12/12/2017  . DEXA SCAN  Completed  . PNA vac Low Risk Adult  Completed     Discussed health benefits of physical activity, and encouraged her to engage in regular exercise appropriate for her age and condition.   1. Medicare annual wellness visit, subsequent Normal exam today.  2. Colon cancer screening Refuses colonoscopy but will get cologuard screening. Cologuard ordered as below. I will f/u pending results.  - Cologuard  3. Breast cancer screening There is no family history of breast cancer. She does perform regular self breast exams. Mammogram was ordered as below. Information for [facility] clinic was given to patient so she may schedule her mammogram at her convenience. - MM Digital Screening; Future  4. Postmenopausal estrogen deficiency Started on Fosamax in 2017. Will recheck BMD to make sure improving BMD. If not, may consider stopping fosamax and starting prolia for better control and reduce fracture risk. I will f/u pending BMD results.  - DG Bone Density; Future  5. Age-related osteoporosis without current pathological fracture See above medical treatment plan. - DG Bone Density; Future  6. Influenza vaccination declined  7. Essential  hypertension Fairly stable today in the office. Continue amlodipine 10mg . Will check labs as below and f/u pending results. - CBC w/Diff/Platelet - COMPLETE METABOLIC PANEL WITH GFR - Lipid Profile  8. CAFL (chronic airflow limitation) (HCC) Stable. Will check labs as below and f/u pending results. Discussed smoking cessation in detail for approx 3-5 minutes. Patient admits to having stopped for 3 years then stress increased and she admits to sneaking and smoking when she is stressed.  - CBC w/Diff/Platelet - COMPLETE METABOLIC PANEL WITH GFR  9. Major depressive disorder with single episode, in partial remission ([place]) Worsening due to outside factors, family members that live with her and husband's health failing. Will give sertraline as below. I will see her back in 4 weeks to see how she is doing with this medication.  - sertraline (ZOLOFT) 25 MG tablet; Take 1 tablet (25 mg total) by mouth at bedtime.  Dispense: 30 tablet; Refill: 1  10. Skin ulcer of left ankle, limited to breakdown of skin ([place]) Followed by vascular surgery. Reports most recent vascular US was stable, patent stents.  - CBC w/Diff/Platelet - COMPLETE METABOLIC PANEL WITH GFR  11. Ulcer of right foot, unspecified ulcer stage ([place]) Followed by vascular surgery.  - CBC w/Diff/Platelet - COMPLETE METABOLIC PANEL WITH GFR  12. Benign essential HTN Stable. Diagnosis pulled for medication refill. Continue current medical treatment plan. - amLODipine (NORVASC) 10 MG tablet; Take 1 tablet (10 mg total) by mouth daily.  Dispense: 90 tablet; Refill: 3  13. Atherosclerosis of native artery of both lower extremities with intermittent claudication ([place]) Followed by vascular surgery. - clopidogrel (PLAVIX) 75 MG tablet; Take 1 tablet (75 mg total) by mouth daily.  Dispense: 90 tablet; Refill: 3  14. Encounter for smoking cessation counseling Discussed for 3-5 minutes. Patient has previously stopped for 3 years but recently  started back up due to stressors. Will start sertraline 25mg  as noted above to see if this  helps control nerves better, thus helping her quit smoking again.    Mar Daring, PA-C  [facility]

## 2017-03-02 ENCOUNTER — Telehealth: Payer: Self-pay

## 2017-03-02 DIAGNOSIS — E7800 Pure hypercholesterolemia, unspecified: Secondary | ICD-10-CM

## 2017-03-02 NOTE — Telephone Encounter (Signed)
-----   Message from Mar Daring, [place] sent at 03/01/2017  1:24 PM EDT ----- Cholesterol is elevated and ASCVD 10 yr risk is very elevated at 31.5% of having a cardiovascular event in the next 10 years. I would recommend to start a statin to lower cholesterol. If patient agreeable I will send to her pharmacy. All other labs are stable and WNL.

## 2017-03-02 NOTE — Telephone Encounter (Signed)
LMTCB  Thanks,  -Suleyma Wafer 

## 2017-03-06 NOTE — Telephone Encounter (Signed)
Patient advised as below. Patient verbalizes understanding and is in agreement with treatment plan. Patient agreed to starting statin medication she uses [facility]

## 2017-03-07 MED ORDER — ATORVASTATIN CALCIUM 10 MG PO TABS: 10.0000 mg | ORAL_TABLET | Freq: Every day | ORAL | 3 refills | Status: DC

## 2017-03-07 NOTE — Telephone Encounter (Signed)
Atorvastatin sent in 

## 2017-03-20 DIAGNOSIS — Z1211 Encounter for screening for malignant neoplasm of colon: Secondary | ICD-10-CM | POA: Diagnosis not present

## 2017-03-20 DIAGNOSIS — Z1212 Encounter for screening for malignant neoplasm of rectum: Secondary | ICD-10-CM | POA: Diagnosis not present

## 2017-03-26 ENCOUNTER — Telehealth: Payer: Self-pay

## 2017-03-26 ENCOUNTER — Ambulatory Visit
Admission: RE | Admit: 2017-03-26 | Discharge: 2017-03-26 | Disposition: A | Discharge status: Home / Self Care | Payer: Commercial Managed Care - HMO | Source: Ambulatory Visit | Attending: Physician Assistant | Admitting: Physician Assistant

## 2017-03-26 DIAGNOSIS — Z1231 Encounter for screening mammogram for malignant neoplasm of breast: Secondary | ICD-10-CM | POA: Diagnosis not present

## 2017-03-26 DIAGNOSIS — Z780 Asymptomatic menopausal state: Secondary | ICD-10-CM | POA: Insufficient documentation

## 2017-03-26 DIAGNOSIS — M810 Age-related osteoporosis without current pathological fracture: Secondary | ICD-10-CM | POA: Insufficient documentation

## 2017-03-26 DIAGNOSIS — Z1239 Encounter for other screening for malignant neoplasm of breast: Secondary | ICD-10-CM

## 2017-03-26 NOTE — Telephone Encounter (Signed)
-----   Message from Mar Daring, [place] sent at 03/26/2017  2:24 PM EDT ----- Normal mammogram. Repeat screening in one year.

## 2017-03-26 NOTE — Telephone Encounter (Signed)
lmtcb

## 2017-03-26 NOTE — Telephone Encounter (Signed)
-----   Message from Mar Daring, PA-C sent at 03/26/2017  2:26 PM EDT ----- BMD unchanged from last year. T score remains -3.4. Does patient want to continue fosamax? If so I will send in, if not I can refer her to endocrine for other options now available for osteoporosis.

## 2017-03-27 ENCOUNTER — Telehealth: Payer: Self-pay | Admitting: Physician Assistant

## 2017-03-27 DIAGNOSIS — F324 Major depressive disorder, single episode, in partial remission: Secondary | ICD-10-CM

## 2017-03-27 MED ORDER — SERTRALINE HCL 25 MG PO TABS: 25.0000 mg | ORAL_TABLET | Freq: Every day | ORAL | 1 refills | Status: DC

## 2017-03-27 NOTE — Telephone Encounter (Signed)
Tracey Rogers faxed a request for a 90-days supply for the following medication.  Thanks CC  sertraline (ZOLOFT) 25 MG tablet

## 2017-03-27 NOTE — Telephone Encounter (Signed)
Please Review

## 2017-03-27 NOTE — Telephone Encounter (Signed)
lmtcb

## 2017-03-27 NOTE — Telephone Encounter (Signed)
refilled 

## 2017-03-28 LAB — COLOGUARD: Cologuard: NEGATIVE

## 2017-03-30 NOTE — Telephone Encounter (Signed)
Addendum: Cologuard results-Negative

## 2017-03-30 NOTE — Telephone Encounter (Signed)
Closing Chart. Tried to contact patient 3 times.

## 2017-04-04 ENCOUNTER — Telehealth: Payer: Self-pay

## 2017-04-04 NOTE — Telephone Encounter (Signed)
-----   Message from Mar Daring, PA-C sent at 04/04/2017  8:21 AM EST ----- Cologuard is negative

## 2017-04-04 NOTE — Telephone Encounter (Signed)
LMTCB  Thanks,  -Tracey Rogers 

## 2017-04-06 NOTE — Telephone Encounter (Signed)
Patient advised as below.  

## 2017-04-25 ENCOUNTER — Telehealth: Payer: Self-pay

## 2017-04-25 NOTE — Telephone Encounter (Signed)
-----   Message from Mar Daring, PA-C sent at 04/25/2017  8:23 AM EST ----- Cologuard was negative. Can repeat in 3 years.

## 2017-04-25 NOTE — Telephone Encounter (Signed)
lmtcb

## 2017-05-16 ENCOUNTER — Ambulatory Visit: Payer: Medicare HMO | Admitting: Physician Assistant

## 2017-05-16 ENCOUNTER — Encounter: Payer: Self-pay | Admitting: Physician Assistant

## 2017-05-16 VITALS — BP 158/70 | HR 68 | Temp 97.9°F | Resp 16 | Wt 160.0 lb

## 2017-05-16 DIAGNOSIS — L97519 Non-pressure chronic ulcer of other part of right foot with unspecified severity: Secondary | ICD-10-CM | POA: Diagnosis not present

## 2017-05-16 DIAGNOSIS — I70213 Atherosclerosis of native arteries of extremities with intermittent claudication, bilateral legs: Secondary | ICD-10-CM

## 2017-05-16 DIAGNOSIS — H6123 Impacted cerumen, bilateral: Secondary | ICD-10-CM

## 2017-05-16 DIAGNOSIS — I7090 Unspecified atherosclerosis: Secondary | ICD-10-CM

## 2017-05-16 DIAGNOSIS — L97321 Non-pressure chronic ulcer of left ankle limited to breakdown of skin: Secondary | ICD-10-CM

## 2017-05-16 NOTE — Telephone Encounter (Signed)
Visit complete.

## 2017-05-16 NOTE — Progress Notes (Signed)
Patient: Tracey Rogers Female    DOB: [birth year]   71 y.o.   MRN: [id number] Visit Date: 05/16/2017  Today's Provider: Mar Daring, PA-C   Chief Complaint  Patient presents with  . Hearing Problem  . Leg Swelling   Subjective:    HPI Patient here today with c/o hearing is getting worse. Had similar issue and required ear lavage by Dr. Tami Ribas with resolution of symptoms.   Daughter also mentions that Gazelle's legs are becoming more discolored again and swelling is increasing in left leg again. Patient does have H/O PAD and followed by Dr. Delana Meyer. Most recently seen in 01/2017. 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 She has history of ulceration of the posterior left lower extremity and ulceration of the right foot. Left ulcer is still present but covered with a thick layer of dry eschar. Reddening noted of the left lower extremity. Dry, thick scaling also present. Patient still denies claudication symptoms or rest pain at this time.     Allergies  Allergen Reactions  . Clarithromycin Rash    "Mycin"  . Methocarbamol Rash  . Minocin [Minocycline Hcl] Rash  . Other Rash    Optifoam applied to her left achilles wound.   . Tetracycline Rash  . Tetracyclines & Related Rash     Current Outpatient Medications:  .  acetaminophen (TYLENOL) 325 MG tablet, Take 650 mg by mouth every 6 (six) hours as needed., Disp: , Rfl:  .  alendronate (FOSAMAX) 70 MG tablet, TAKE ONE TABLET EVERY WEEK AT LEAST 30 MINUTES BEFORE FOOD OR BEVERAGE WITH A LITTLE WATER, Disp: 12 tablet, Rfl: 3 .  amLODipine (NORVASC) 10 MG tablet, Take 1 tablet (10 mg total) by mouth daily., Disp: 90 tablet, Rfl: 3 .  aspirin EC 81 MG tablet, Take 81 mg by mouth daily. In evening, Disp: , Rfl:  .  atorvastatin (LIPITOR) 10 MG tablet, Take 1 tablet (10 mg total) by mouth daily., Disp: 90 tablet, Rfl: 3 .  clopidogrel (PLAVIX) 75 MG tablet, Take 1 tablet (75 mg total) by mouth daily., Disp: 90  tablet, Rfl: 3 .  fluticasone (FLONASE) 50 MCG/ACT nasal spray, Place 2 sprays into both nostrils daily. (Patient taking differently: Place 2 sprays into both nostrils daily. As needed), Disp: 16 g, Rfl: 2 .  loratadine (CLARITIN) 10 MG tablet, Take 10 mg by mouth daily. As needed, Disp: , Rfl:  .  Magnesium 400 MG CAPS, Take 400 mg by mouth every evening. As needed for leg cramps., Disp: , Rfl:  .  sertraline (ZOLOFT) 25 MG tablet, Take 1 tablet (25 mg total) by mouth at bedtime., Disp: 90 tablet, Rfl: 1 .  triamcinolone cream (KENALOG) 0.1 %, Apply 1 application topically 2 (two) times daily. As needed, Disp: , Rfl:  .  HYDROcodone-acetaminophen (NORCO) 5-325 MG tablet, Take 1-2 tablets by mouth every 4 (four) hours as needed for moderate pain. (Patient not taking: Reported on 05/16/2017), Disp: 30 tablet, Rfl: 0  Review of Systems  Constitutional: Negative.   Respiratory: Negative.   Cardiovascular: Positive for leg swelling (left ankle and right foot hurts.). Negative for chest pain and palpitations.  Gastrointestinal: Negative.   Musculoskeletal: Positive for myalgias.  Neurological: Positive for numbness.    Social History   Tobacco Use  . Smoking status: Former Smoker    Packs/day: 0.50    Years: 42.00    Pack years: 21.00    Types: Cigarettes  Last attempt to quit: 12/24/2013    Years since quitting: 3.3  . Smokeless tobacco: Former Systems developer  . Tobacco comment: no smokers in her home  Substance Use Topics  . Alcohol use: No    Alcohol/week: 0.0 oz   Objective:   BP (!) 158/70 (BP Location: Right Arm, Patient Position: Sitting, Cuff Size: Normal)   Pulse 68   Temp 97.9 F (36.6 C) (Oral)   Resp 16   Wt 160 lb (72.6 kg)   SpO2 97%   BMI 27.46 kg/m    Physical Exam  Constitutional: She appears well-developed and well-nourished. No distress.  HENT:  Head: Normocephalic and atraumatic.  Right Ear: Hearing and external ear normal.  Left Ear: Hearing and external ear  normal.  Nose: Nose normal.  Mouth/Throat: Uvula is midline, oropharynx is clear and moist and mucous membranes are normal. No oropharyngeal exudate.  Cerumen impaction noted bilaterally  Eyes: Conjunctivae are normal. Pupils are equal, round, and reactive to light. Right eye exhibits no discharge. Left eye exhibits no discharge. No scleral icterus.  Neck: Normal range of motion. Neck supple.  Cardiovascular: Normal rate, regular rhythm and normal heart sounds. Exam reveals no gallop and no friction rub.  No murmur heard. Pulses:      Dorsalis pedis pulses are 0 on the right side, and 0 on the left side.       Posterior tibial pulses are 0 on the right side, and 0 on the left side.  Distal pulses not well palpated but cap refill is < 3 seconds and feet are warm bilaterally  Pulmonary/Chest: Effort normal and breath sounds normal. No respiratory distress. She has no wheezes. She has no rales.  Musculoskeletal: She exhibits no edema (no edema today, daughter reports about much improved over last 2 days).  Skin: Skin is warm and dry. She is not diaphoretic. There is erythema.     Vitals reviewed.       Assessment & Plan:     1. Atherosclerosis Referral placed back to Dr. Delana Meyer as below as patient is already established with him. Unsure if vascular status has declined much since September. No new ulcerations, claudication or rest pain. Patient is just having increased swelling and discoloration of the left lower extremity and some right foot pain. Daughter is requesting referral for sooner appointment.  - Ambulatory referral to Vascular Surgery  2. Skin ulcer of left ankle, limited to breakdown of skin ([place]) See above medical treatment plan. - Ambulatory referral to Vascular Surgery  3. Ulcer of right foot, unspecified ulcer stage ([place]) See above medical treatment plan. - Ambulatory referral to Vascular Surgery  4. Atherosclerosis of native artery of both lower extremities with  intermittent claudication ([place]) See above medical treatment plan. - Ambulatory referral to Vascular Surgery  5. Bilateral impacted cerumen Unfortunately we were out of Debrox drops today and unable to soften wax enough to attempt removal. Advised to use Debrox OTC and call in one week if still having decreased hearing for [place] to recheck ears and attempt lavage.        Mar Daring, PA-C  [facility]

## 2017-05-23 DIAGNOSIS — M150 Primary generalized (osteo)arthritis: Secondary | ICD-10-CM | POA: Diagnosis not present

## 2017-05-23 DIAGNOSIS — R269 Unspecified abnormalities of gait and mobility: Secondary | ICD-10-CM | POA: Diagnosis not present

## 2017-05-24 ENCOUNTER — Encounter (INDEPENDENT_AMBULATORY_CARE_PROVIDER_SITE_OTHER): Payer: Self-pay

## 2017-05-24 ENCOUNTER — Encounter (INDEPENDENT_AMBULATORY_CARE_PROVIDER_SITE_OTHER): Payer: Self-pay | Admitting: Vascular Surgery

## 2017-05-24 ENCOUNTER — Ambulatory Visit (INDEPENDENT_AMBULATORY_CARE_PROVIDER_SITE_OTHER): Payer: Medicare HMO

## 2017-05-24 ENCOUNTER — Ambulatory Visit (INDEPENDENT_AMBULATORY_CARE_PROVIDER_SITE_OTHER): Payer: Medicare HMO | Admitting: Vascular Surgery

## 2017-05-24 VITALS — BP 156/72 | HR 67 | Resp 16 | Ht 68.5 in | Wt 156.0 lb

## 2017-05-24 DIAGNOSIS — I70213 Atherosclerosis of native arteries of extremities with intermittent claudication, bilateral legs: Secondary | ICD-10-CM | POA: Diagnosis not present

## 2017-05-24 DIAGNOSIS — I10 Essential (primary) hypertension: Secondary | ICD-10-CM | POA: Diagnosis not present

## 2017-05-24 DIAGNOSIS — L97909 Non-pressure chronic ulcer of unspecified part of unspecified lower leg with unspecified severity: Secondary | ICD-10-CM | POA: Diagnosis not present

## 2017-05-24 DIAGNOSIS — G458 Other transient cerebral ischemic attacks and related syndromes: Secondary | ICD-10-CM

## 2017-05-24 DIAGNOSIS — L97321 Non-pressure chronic ulcer of left ankle limited to breakdown of skin: Secondary | ICD-10-CM

## 2017-05-24 DIAGNOSIS — I70299 Other atherosclerosis of native arteries of extremities, unspecified extremity: Secondary | ICD-10-CM

## 2017-05-24 MED ORDER — SILVER SULFADIAZINE 1 % EX CREA: 1.0000 "application " | TOPICAL_CREAM | Freq: Every day | CUTANEOUS | 0 refills | Status: DC

## 2017-05-25 ENCOUNTER — Other Ambulatory Visit (INDEPENDENT_AMBULATORY_CARE_PROVIDER_SITE_OTHER): Payer: Self-pay | Admitting: Vascular Surgery

## 2017-05-27 ENCOUNTER — Encounter (INDEPENDENT_AMBULATORY_CARE_PROVIDER_SITE_OTHER): Payer: Self-pay | Admitting: Vascular Surgery

## 2017-05-27 NOTE — Progress Notes (Signed)
MRN : [id number]  Tracey Rogers is a 71 y.o. (Sep 26, 1945) female who presents with chief complaint of  Chief Complaint  Patient presents with  . Follow-up    6 month ABI and Bilateral duplex  .  History of Present Illness: The patient returns to the office for followup and review of the noninvasive studies. There has been a significant deterioration in the lower extremity symptoms.  The patient notes interval shortening of their claudication distance and development of mild rest pain symptoms. No new ulcers or wounds have occurred since the last visit.  There have been no significant changes to the patient's overall health care.  The patient denies amaurosis fugax or recent TIA symptoms. There are no recent neurological changes noted. The patient denies history of DVT, PE or superficial thrombophlebitis. The patient denies recent episodes of angina or shortness of breath.   ABI's Rt=0.96 and Lt=0.88  Duplex US of the lower extremity arterial system shows left leg in stent restenosis    Current Meds  Medication Sig  . acetaminophen (TYLENOL) 325 MG tablet Take 650 mg by mouth every 6 (six) hours as needed.  Marland Kitchen alendronate (FOSAMAX) 70 MG tablet TAKE ONE TABLET EVERY WEEK AT LEAST 30 MINUTES BEFORE FOOD OR BEVERAGE WITH A LITTLE WATER  . amLODipine (NORVASC) 10 MG tablet Take 1 tablet (10 mg total) by mouth daily.  . ASPIRIN 81 PO   . atorvastatin (LIPITOR) 10 MG tablet Take 1 tablet (10 mg total) by mouth daily.  . clopidogrel (PLAVIX) 75 MG tablet Take 1 tablet (75 mg total) by mouth daily.  . fluticasone (FLONASE) 50 MCG/ACT nasal spray Place 2 sprays into both nostrils daily. (Patient taking differently: Place 2 sprays into both nostrils daily. As needed)  . HYDROcodone-acetaminophen (NORCO) 5-325 MG tablet Take 1-2 tablets by mouth every 4 (four) hours as needed for moderate pain.  Marland Kitchen loratadine (CLARITIN) 10 MG tablet Take 10 mg by mouth daily. As needed  . Magnesium 400  MG CAPS Take 400 mg by mouth every evening. As needed for leg cramps.  . sertraline (ZOLOFT) 25 MG tablet Take 1 tablet (25 mg total) by mouth at bedtime.  . triamcinolone cream (KENALOG) 0.1 % Apply 1 application topically 2 (two) times daily. As needed    Past Medical History:  Diagnosis Date  . Arthritis   . Complication of anesthesia    gets weak after surgery  . Constipation   . COPD (chronic obstructive pulmonary disease) (HCC)    mild  . Family history of adverse reaction to anesthesia    son gets postop nausea and vomiting, weak  . GERD (gastroesophageal reflux disease)   . Heart murmur   . Hypertension   . Peripheral vascular disease ([place])   . Psoriasis   . Psoriasis   . Skin ulcer of right great toe ([place])   . Ulcer of left ankle ([place])   . Wears dentures     Past Surgical History:  Procedure Laterality Date  . APPLICATION OF A-CELL OF EXTREMITY Left 04/13/2014   Procedure: PLACEMENT OF APPLICATION OF A-CELL ;  Surgeon: Theodoro Kos, DO;  Location: [facility];  Service: Plastics;  Laterality: Left;  . APPLICATION OF A-CELL OF EXTREMITY Left 09/10/2014   Procedure: APPLICATION OF A-CELL OF EXTREMITY;  Surgeon: Theodoro Kos, DO;  Location: [facility];  Service: Plastics;  Laterality: Left;  . CARDIOVASCULAR STRESS TEST  03-03-2014  dr Saralyn Pilar   normal lexi scan  sestamibi study/  normal LVF without evidence for significant scar or ischemia  . CATARACT EXTRACTION W/ INTRAOCULAR LENS  IMPLANT, BILATERAL  2015  . CHOLECYSTECTOMY N/A 06/16/2015   Procedure: LAPAROSCOPIC CHOLECYSTECTOMY WITH CHOLANGIOGRAM;  Surgeon: Robert Bellow, MD;  Location: [facility];  Service: General;  Laterality: N/A;  . ENDOVASCULAR STENT INSERTION  sept  &  oct  2015   LEFT LEG STENTING--  common and superficial femoral artery  . ENDOVASCULAR STENT INSERTION  May 27, 2014   LifeStent bare metal stent left SFA  . HERNIA REPAIR  10/27/2015   Ventral hernia  repaired with Ventrio ST mesh above previous umbilical port site  . I&D EXTREMITY Left 09/10/2014   Procedure: IRRIGATION AND DEBRIDEMENT LEFT ANKLE WOUND SURGICAL PREP ;  Surgeon: Theodoro Kos, DO;  Location: [facility];  Service: Plastics;  Laterality: Left;  . INCISION AND DRAINAGE OF WOUND Bilateral 04/13/2014   Procedure: IRRIGATION AND DEBRIDEMENT OF LEFT ANKLE WOUND AND RIGHT FOOT;  Surgeon: Theodoro Kos, DO;  Location: [facility];  Service: Plastics;  Laterality: Bilateral;  . TRANSTHORACIC ECHOCARDIOGRAM  03-03-2014   normal LVF/  ef 55-60%/  mild TI and MI  . VENTRAL HERNIA REPAIR N/A 10/27/2015   Procedure: HERNIA REPAIR VENTRAL ADULT;  Surgeon: Robert Bellow, MD;  Location: [facility];  Service: General;  Laterality: N/A;    Social History Social History   Tobacco Use  . Smoking status: Former Smoker    Packs/day: 0.50    Years: 42.00    Pack years: 21.00    Types: Cigarettes    Last attempt to quit: 12/24/2013    Years since quitting: 3.4  . Smokeless tobacco: Former Systems developer  . Tobacco comment: no smokers in her home  Substance Use Topics  . Alcohol use: No    Alcohol/week: 0.0 oz  . Drug use: No    Family History Family History  Problem Relation Age of Onset  . Kidney disease Mother   . Hypertension Mother   . Heart disease Mother   . Breast cancer Maternal Aunt 60  . Heart disease Sister   . Stroke Sister     Allergies  Allergen Reactions  . Clarithromycin Rash    "Mycin"  . Methocarbamol Rash  . Minocin [Minocycline Hcl] Rash  . Other Rash    Optifoam applied to her left achilles wound.   . Tetracycline Rash  . Tetracyclines & Related Rash     REVIEW OF SYSTEMS (Negative unless checked)  Constitutional: [] Weight loss  [] Fever  [] Chills Cardiac: [] Chest pain   [] Chest pressure   [] Palpitations   [] Shortness of breath when laying flat   [] Shortness of breath with exertion. Vascular:  [x] Pain in legs with walking    [] Pain in legs at rest  [] History of DVT   [] Phlebitis   [] Swelling in legs   [] Varicose veins   [x] Non-healing ulcers Pulmonary:   [] Uses home oxygen   [] Productive cough   [] Hemoptysis   [] Wheeze  [] COPD   [] Asthma Neurologic:  [] Dizziness   [] Seizures   [] History of stroke   [] History of TIA  [] Aphasia   [] Vissual changes   [] Weakness or numbness in arm   [] Weakness or numbness in leg Musculoskeletal:   [] Joint swelling   [] Joint pain   [] Low back pain Hematologic:  [] Easy bruising  [] Easy bleeding   [] Hypercoagulable state   [] Anemic Gastrointestinal:  [] Diarrhea   [] Vomiting  [] Gastroesophageal reflux/heartburn   [] Difficulty swallowing. Genitourinary:  []   Chronic kidney disease   [] Difficult urination  [] Frequent urination   [] Blood in urine Skin:  [] Rashes   [x] Ulcers  Psychological:  [] History of anxiety   []  History of major depression.  Physical Examination  Vitals:   05/24/17 1044  BP: (!) 156/72  Pulse: 67  Resp: 16  Weight: 156 lb (70.8 kg)  Height: 5' 8.5" (1.74 m)   Body mass index is 23.37 kg/m. Gen: WD/WN, NAD Head: [place]/AT, No temporalis wasting.  Ear/Nose/Throat: Hearing grossly intact, nares w/o erythema or drainage Eyes: PER, EOMI, sclera nonicteric.  Neck: Supple, no large masses.   Pulmonary:  Good air movement, no audible wheezing bilaterally, no use of accessory muscles.  Cardiac: RRR, no JVD Vascular:  Ulcer left ankle Vessel Right Left  Radial Palpable Palpable  PT Not Palpable Not Palpable  DP Not Palpable Not Palpable  Gastrointestinal: Non-distended. No guarding/no peritoneal signs.  Musculoskeletal: M/S 5/5 throughout.  No deformity or atrophy.  Neurologic: CN 2-12 intact. Symmetrical.  Speech is fluent. Motor exam as listed above. Psychiatric: Judgment intact, Mood & affect appropriate for pt's clinical situation. Dermatologic: No rashes left leg ulcer noninfected.  No changes consistent with cellulitis. Lymph : No lichenification or skin changes of  chronic lymphedema.  CBC Lab Results  Component Value Date   WBC 10.4 02/28/2017   HGB 13.3 02/28/2017   HCT 39.0 02/28/2017   MCV 88.2 02/28/2017   PLT 283 02/28/2017    BMET    Component Value Date/Time   NA 138 02/28/2017 1105   NA 128 (L) 05/27/2014 1253   K 4.4 02/28/2017 1105   K 4.2 05/27/2014 1253   CL 106 02/28/2017 1105   CL 97 (L) 05/27/2014 1253   CO2 26 02/28/2017 1105   CO2 25 05/27/2014 1253   GLUCOSE 111 (H) 02/28/2017 1105   GLUCOSE 84 05/27/2014 1253   BUN 9 02/28/2017 1105   BUN 9 05/27/2014 1253   CREATININE 0.82 02/28/2017 1105   CALCIUM 9.4 02/28/2017 1105   CALCIUM 9.1 05/27/2014 1253   GFRNONAA 72 02/28/2017 1105   GFRAA 84 02/28/2017 1105   CrCl cannot be calculated (Patient's most recent lab result is older than the maximum 21 days allowed.).  COAG Lab Results  Component Value Date   INR 0.9 03/18/2014    Radiology No results found.  Assessment/Plan 1. Atherosclerosis of artery of extremity with ulceration (HCC)  Recommend:  The patient has evidence of severe atherosclerotic changes of both lower extremities associated with ulceration and tissue loss of the foot.  This represents a limb threatening ischemia and places the patient at the risk for limb loss.  Patient should undergo angiography of the lower extremities with the hope for intervention for limb salvage.  The risks and benefits as well as the alternative therapies was discussed in detail with the patient.  All questions were answered.  Patient agrees to proceed with angiography.  The patient will follow up with me in the office after the procedure.    2. Skin ulcer of left ankle, limited to breakdown of skin ([place])  Recommend:  The patient has evidence of severe atherosclerotic changes of both lower extremities associated with ulceration and tissue loss of the foot.  This represents a limb threatening ischemia and places the patient at the risk for limb loss.  Patient  should undergo angiography of the lower extremities with the hope for intervention for limb salvage.  The risks and benefits as well as the alternative therapies was  discussed in detail with the patient.  All questions were answered.  Patient agrees to proceed with angiography.  The patient will follow up with me in the office after the procedure.    3. Brachial-basilar insufficiency syndrome Continue pulmonary medications and aerosols as already ordered, these medications have been reviewed and there are no changes at this time.    4. Essential hypertension Continue antihypertensive medications as already ordered, these medications have been reviewed and there are no changes at this time.     Hortencia Pilar, MD  05/27/2017 11:13 AM

## 2017-06-25 ENCOUNTER — Other Ambulatory Visit: Payer: Commercial Managed Care - HMO

## 2017-06-25 ENCOUNTER — Encounter
Admission: RE | Admit: 2017-06-25 | Discharge: 2017-06-25 | Disposition: A | Discharge status: Home / Self Care | Payer: Medicare HMO | Source: Ambulatory Visit | Attending: Vascular Surgery | Admitting: Vascular Surgery

## 2017-06-25 DIAGNOSIS — Z9049 Acquired absence of other specified parts of digestive tract: Secondary | ICD-10-CM | POA: Diagnosis not present

## 2017-06-25 DIAGNOSIS — Z881 Allergy status to other antibiotic agents status: Secondary | ICD-10-CM | POA: Diagnosis not present

## 2017-06-25 DIAGNOSIS — G450 Vertebro-basilar artery syndrome: Secondary | ICD-10-CM | POA: Diagnosis not present

## 2017-06-25 DIAGNOSIS — L97421 Non-pressure chronic ulcer of left heel and midfoot limited to breakdown of skin: Secondary | ICD-10-CM | POA: Diagnosis not present

## 2017-06-25 DIAGNOSIS — Z87891 Personal history of nicotine dependence: Secondary | ICD-10-CM | POA: Diagnosis not present

## 2017-06-25 DIAGNOSIS — Z841 Family history of disorders of kidney and ureter: Secondary | ICD-10-CM | POA: Diagnosis not present

## 2017-06-25 DIAGNOSIS — Z8249 Family history of ischemic heart disease and other diseases of the circulatory system: Secondary | ICD-10-CM | POA: Diagnosis not present

## 2017-06-25 DIAGNOSIS — Z888 Allergy status to other drugs, medicaments and biological substances status: Secondary | ICD-10-CM | POA: Diagnosis not present

## 2017-06-25 DIAGNOSIS — Z9841 Cataract extraction status, right eye: Secondary | ICD-10-CM | POA: Diagnosis not present

## 2017-06-25 DIAGNOSIS — I252 Old myocardial infarction: Secondary | ICD-10-CM | POA: Diagnosis not present

## 2017-06-25 DIAGNOSIS — K219 Gastro-esophageal reflux disease without esophagitis: Secondary | ICD-10-CM | POA: Diagnosis not present

## 2017-06-25 DIAGNOSIS — M1990 Unspecified osteoarthritis, unspecified site: Secondary | ICD-10-CM | POA: Diagnosis not present

## 2017-06-25 DIAGNOSIS — Z01818 Encounter for other preprocedural examination: Secondary | ICD-10-CM | POA: Diagnosis not present

## 2017-06-25 DIAGNOSIS — Z803 Family history of malignant neoplasm of breast: Secondary | ICD-10-CM | POA: Diagnosis not present

## 2017-06-25 DIAGNOSIS — Z9889 Other specified postprocedural states: Secondary | ICD-10-CM | POA: Diagnosis not present

## 2017-06-25 DIAGNOSIS — Z9842 Cataract extraction status, left eye: Secondary | ICD-10-CM | POA: Diagnosis not present

## 2017-06-25 DIAGNOSIS — Z9851 Tubal ligation status: Secondary | ICD-10-CM | POA: Diagnosis not present

## 2017-06-25 DIAGNOSIS — I10 Essential (primary) hypertension: Secondary | ICD-10-CM | POA: Diagnosis not present

## 2017-06-25 DIAGNOSIS — Z823 Family history of stroke: Secondary | ICD-10-CM | POA: Diagnosis not present

## 2017-06-25 DIAGNOSIS — I7025 Atherosclerosis of native arteries of other extremities with ulceration: Secondary | ICD-10-CM | POA: Diagnosis not present

## 2017-06-25 DIAGNOSIS — J449 Chronic obstructive pulmonary disease, unspecified: Secondary | ICD-10-CM | POA: Diagnosis not present

## 2017-06-25 LAB — CREATININE, SERUM
CREATININE: 0.78 mg/dL (ref 0.44–1.00)
GFR calc Af Amer: >60 mL/min (ref >60)

## 2017-06-25 LAB — BUN: BUN: 12 mg/dL (ref 6–20)

## 2017-06-25 MED ORDER — CEFAZOLIN SODIUM-DEXTROSE 2-4 GM/100ML-% IV SOLN
2.0000 g | Freq: Once | INTRAVENOUS | Status: AC
  Administered 2017-06-26: 2 g via INTRAVENOUS

## 2017-06-26 ENCOUNTER — Ambulatory Visit
Admission: RE | Admit: 2017-06-26 | Discharge: 2017-06-26 | Disposition: A | Discharge status: Home / Self Care | Payer: Medicare HMO | Source: Ambulatory Visit | Attending: Vascular Surgery | Admitting: Vascular Surgery

## 2017-06-26 ENCOUNTER — Encounter
Admission: RE | Disposition: A | Discharge status: Home / Self Care | Payer: Self-pay | Source: Ambulatory Visit | Attending: Vascular Surgery

## 2017-06-26 ENCOUNTER — Encounter: Payer: Self-pay | Admitting: *Deleted

## 2017-06-26 DIAGNOSIS — M1990 Unspecified osteoarthritis, unspecified site: Secondary | ICD-10-CM | POA: Diagnosis not present

## 2017-06-26 DIAGNOSIS — Z823 Family history of stroke: Secondary | ICD-10-CM | POA: Insufficient documentation

## 2017-06-26 DIAGNOSIS — J449 Chronic obstructive pulmonary disease, unspecified: Secondary | ICD-10-CM | POA: Diagnosis not present

## 2017-06-26 DIAGNOSIS — Z8249 Family history of ischemic heart disease and other diseases of the circulatory system: Secondary | ICD-10-CM | POA: Insufficient documentation

## 2017-06-26 DIAGNOSIS — I252 Old myocardial infarction: Secondary | ICD-10-CM | POA: Diagnosis not present

## 2017-06-26 DIAGNOSIS — Z888 Allergy status to other drugs, medicaments and biological substances status: Secondary | ICD-10-CM | POA: Insufficient documentation

## 2017-06-26 DIAGNOSIS — K219 Gastro-esophageal reflux disease without esophagitis: Secondary | ICD-10-CM | POA: Insufficient documentation

## 2017-06-26 DIAGNOSIS — I10 Essential (primary) hypertension: Secondary | ICD-10-CM | POA: Insufficient documentation

## 2017-06-26 DIAGNOSIS — Z881 Allergy status to other antibiotic agents status: Secondary | ICD-10-CM | POA: Insufficient documentation

## 2017-06-26 DIAGNOSIS — Z9851 Tubal ligation status: Secondary | ICD-10-CM | POA: Insufficient documentation

## 2017-06-26 DIAGNOSIS — L97421 Non-pressure chronic ulcer of left heel and midfoot limited to breakdown of skin: Secondary | ICD-10-CM | POA: Insufficient documentation

## 2017-06-26 DIAGNOSIS — Z87891 Personal history of nicotine dependence: Secondary | ICD-10-CM | POA: Diagnosis not present

## 2017-06-26 DIAGNOSIS — I70248 Atherosclerosis of native arteries of left leg with ulceration of other part of lower left leg: Secondary | ICD-10-CM | POA: Diagnosis not present

## 2017-06-26 DIAGNOSIS — Z9049 Acquired absence of other specified parts of digestive tract: Secondary | ICD-10-CM | POA: Insufficient documentation

## 2017-06-26 DIAGNOSIS — Z803 Family history of malignant neoplasm of breast: Secondary | ICD-10-CM | POA: Insufficient documentation

## 2017-06-26 DIAGNOSIS — G450 Vertebro-basilar artery syndrome: Secondary | ICD-10-CM | POA: Insufficient documentation

## 2017-06-26 DIAGNOSIS — Z01818 Encounter for other preprocedural examination: Secondary | ICD-10-CM | POA: Insufficient documentation

## 2017-06-26 DIAGNOSIS — I70219 Atherosclerosis of native arteries of extremities with intermittent claudication, unspecified extremity: Secondary | ICD-10-CM

## 2017-06-26 DIAGNOSIS — Z9889 Other specified postprocedural states: Secondary | ICD-10-CM | POA: Insufficient documentation

## 2017-06-26 DIAGNOSIS — Z841 Family history of disorders of kidney and ureter: Secondary | ICD-10-CM | POA: Insufficient documentation

## 2017-06-26 DIAGNOSIS — I7025 Atherosclerosis of native arteries of other extremities with ulceration: Secondary | ICD-10-CM | POA: Insufficient documentation

## 2017-06-26 DIAGNOSIS — Z9842 Cataract extraction status, left eye: Secondary | ICD-10-CM | POA: Insufficient documentation

## 2017-06-26 DIAGNOSIS — Z9841 Cataract extraction status, right eye: Secondary | ICD-10-CM | POA: Insufficient documentation

## 2017-06-26 HISTORY — PX: LOWER EXTREMITY ANGIOGRAPHY: CATH118251

## 2017-06-26 SURGERY — LOWER EXTREMITY ANGIOGRAPHY
Anesthesia: Moderate Sedation | Laterality: Left

## 2017-06-26 MED ORDER — SODIUM CHLORIDE 0.9% FLUSH: 3.0000 mL | INTRAVENOUS | Status: DC | PRN

## 2017-06-26 MED ORDER — HYDROMORPHONE HCL 1 MG/ML IJ SOLN: 1.0000 mg | Freq: Once | INTRAMUSCULAR | Status: DC | PRN

## 2017-06-26 MED ORDER — SODIUM CHLORIDE 0.9 % IV SOLN: 250.0000 mL | INTRAVENOUS | Status: DC | PRN

## 2017-06-26 MED ORDER — OXYCODONE HCL 5 MG PO TABS: 5.0000 mg | ORAL_TABLET | ORAL | Status: DC | PRN

## 2017-06-26 MED ORDER — LIDOCAINE HCL (PF) 1 % IJ SOLN
INTRAMUSCULAR | Status: AC
  Filled 2017-06-26: qty 30

## 2017-06-26 MED ORDER — HEPARIN SODIUM (PORCINE) 1000 UNIT/ML IJ SOLN
INTRAMUSCULAR | Status: DC | PRN
  Administered 2017-06-26: 4000 [IU] via INTRAVENOUS

## 2017-06-26 MED ORDER — SODIUM CHLORIDE 0.9 % IV SOLN
INTRAVENOUS | Status: DC
  Administered 2017-06-26: 08:00:00 via INTRAVENOUS

## 2017-06-26 MED ORDER — BACITRACIN-NEOMYCIN-POLYMYXIN 400-5-5000 EX OINT
TOPICAL_OINTMENT | CUTANEOUS | Status: AC
  Filled 2017-06-26: qty 1

## 2017-06-26 MED ORDER — MIDAZOLAM HCL 5 MG/5ML IJ SOLN
INTRAMUSCULAR | Status: AC
  Filled 2017-06-26: qty 5

## 2017-06-26 MED ORDER — HEPARIN SODIUM (PORCINE) 1000 UNIT/ML IJ SOLN
INTRAMUSCULAR | Status: AC
  Filled 2017-06-26: qty 1

## 2017-06-26 MED ORDER — MORPHINE SULFATE (PF) 4 MG/ML IV SOLN: 2.0000 mg | INTRAVENOUS | Status: DC | PRN

## 2017-06-26 MED ORDER — HEPARIN (PORCINE) IN NACL 2-0.9 UNIT/ML-% IJ SOLN
INTRAMUSCULAR | Status: AC
  Filled 2017-06-26: qty 1000

## 2017-06-26 MED ORDER — IOPAMIDOL (ISOVUE-300) INJECTION 61%
INTRAVENOUS | Status: DC | PRN
  Administered 2017-06-26: 75 mL via INTRA_ARTERIAL

## 2017-06-26 MED ORDER — CEFAZOLIN SODIUM-DEXTROSE 2-4 GM/100ML-% IV SOLN
INTRAVENOUS | Status: AC
  Administered 2017-06-26: 2 g via INTRAVENOUS
  Filled 2017-06-26: qty 100

## 2017-06-26 MED ORDER — HYDRALAZINE HCL 20 MG/ML IJ SOLN
5.0000 mg | INTRAMUSCULAR | Status: AC | PRN
  Administered 2017-06-26 (×2): 5 mg via INTRAVENOUS

## 2017-06-26 MED ORDER — SODIUM CHLORIDE 0.9 % IV SOLN: INTRAVENOUS | Status: DC

## 2017-06-26 MED ORDER — LABETALOL HCL 5 MG/ML IV SOLN: 10.0000 mg | INTRAVENOUS | Status: DC | PRN

## 2017-06-26 MED ORDER — METHYLPREDNISOLONE SODIUM SUCC 125 MG IJ SOLR: 125.0000 mg | INTRAMUSCULAR | Status: DC | PRN

## 2017-06-26 MED ORDER — FENTANYL CITRATE (PF) 100 MCG/2ML IJ SOLN
INTRAMUSCULAR | Status: AC
  Filled 2017-06-26: qty 2

## 2017-06-26 MED ORDER — HYDRALAZINE HCL 20 MG/ML IJ SOLN
INTRAMUSCULAR | Status: AC
  Administered 2017-06-26: 5 mg via INTRAVENOUS
  Filled 2017-06-26: qty 1

## 2017-06-26 MED ORDER — ONDANSETRON HCL 4 MG/2ML IJ SOLN: 4.0000 mg | Freq: Four times a day (QID) | INTRAMUSCULAR | Status: DC | PRN

## 2017-06-26 MED ORDER — FAMOTIDINE 20 MG PO TABS: 40.0000 mg | ORAL_TABLET | ORAL | Status: DC | PRN

## 2017-06-26 MED ORDER — MIDAZOLAM HCL 2 MG/2ML IJ SOLN
INTRAMUSCULAR | Status: DC | PRN
  Administered 2017-06-26 (×2): 1 mg via INTRAVENOUS
  Administered 2017-06-26: 2 mg via INTRAVENOUS

## 2017-06-26 MED ORDER — SODIUM CHLORIDE 0.9% FLUSH: 3.0000 mL | Freq: Two times a day (BID) | INTRAVENOUS | Status: DC

## 2017-06-26 MED ORDER — SODIUM CHLORIDE 0.9 % IV BOLUS (SEPSIS): 250.0000 mL | Freq: Once | INTRAVENOUS | Status: DC

## 2017-06-26 MED ORDER — FENTANYL CITRATE (PF) 100 MCG/2ML IJ SOLN
INTRAMUSCULAR | Status: DC | PRN
  Administered 2017-06-26 (×3): 50 ug via INTRAVENOUS

## 2017-06-26 SURGICAL SUPPLY — 21 items
BALLN LUTONIX DCB 4X100X130 (BALLOONS) ×2
BALLN LUTONIX DCB 4X40X130 (BALLOONS) ×4
BALLOON LUTONIX DCB 4X100X130 (BALLOONS) ×1 IMPLANT
BALLOON LUTONIX DCB 4X40X130 (BALLOONS) ×2 IMPLANT
CATH ANGIO 5F 80CM MHK1-H (CATHETERS) ×2 IMPLANT
CATH BEACON 5 .035 65 KMP TIP (CATHETERS) ×2 IMPLANT
CATH BEACON 5 .035 65 RIM TIP (CATHETERS) ×2 IMPLANT
CATH PIG 70CM (CATHETERS) ×2 IMPLANT
DEVICE PRESTO INFLATION (MISCELLANEOUS) ×2 IMPLANT
DEVICE STARCLOSE SE CLOSURE (Vascular Products) ×2 IMPLANT
DEVICE TORQUE (MISCELLANEOUS) ×2 IMPLANT
GLIDEWIRE ANGLED SS 035X260CM (WIRE) ×4 IMPLANT
NEEDLE ENTRY 21GA 7CM ECHOTIP (NEEDLE) ×2 IMPLANT
PACK ANGIOGRAPHY (CUSTOM PROCEDURE TRAY) ×2 IMPLANT
SET INTRO CAPELLA COAXIAL (SET/KITS/TRAYS/PACK) ×4 IMPLANT
SHEATH BRITE TIP 5FRX11 (SHEATH) ×4 IMPLANT
SHEATH HIGHFLEX ANSEL 6FRX55 (SHEATH) ×2 IMPLANT
SYR MEDRAD MARK V 150ML (SYRINGE) ×2 IMPLANT
TUBING CONTRAST HIGH PRESS 72 (TUBING) ×2 IMPLANT
WIRE J 3MM .035X145CM (WIRE) ×2 IMPLANT
WIRE MAGIC TORQUE 260C (WIRE) ×2 IMPLANT

## 2017-06-26 NOTE — H&P (Signed)
[facility] Admission History & Physical  MRN : [id number]  Tracey Rogers is a 72 y.o. (Apr 15, 1946) female who presents with chief complaint of No chief complaint on file. Marland Kitchen  History of Present Illness: The patient returns to the office for followup and review of the noninvasive studies. There has been a significant deterioration in the lower extremity symptoms. The patient notes interval shortening of their claudication distance and development of mild rest pain symptoms. No new ulcers or wounds have occurred since the last visit.  There have been no significant changes to the patient's overall health care.  The patient denies amaurosis fugax or recent TIA symptoms. There are no recent neurological changes noted. The patient denies history of DVT, PE or superficial thrombophlebitis. The patient denies recent episodes of angina or shortness of breath.   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 0.9 %  sodium chloride infusion   Intravenous Continuous Stegmayer, Kimberly A, PA-C 75 mL/hr at 06/26/17 0800    . ceFAZolin (ANCEF) 2-4 GM/100ML-% IVPB           . ceFAZolin (ANCEF) IVPB 2g/100 mL premix  2 g Intravenous Once Stegmayer, Kimberly A, PA-C      . famotidine (PEPCID) tablet 40 mg  40 mg Oral PRN Stegmayer, Janalyn Harder, PA-C      . HYDROmorphone (DILAUDID) injection 1 mg  1 mg Intravenous Once PRN Stegmayer, Kimberly A, PA-C      . methylPREDNISolone sodium succinate (SOLU-MEDROL) 125 mg/2 mL injection 125 mg  125 mg Intravenous PRN Stegmayer, Kimberly A, PA-C      . ondansetron (ZOFRAN) injection 4 mg  4 mg Intravenous Q6H PRN Stegmayer, Janalyn Harder, PA-C        Past Medical History:  Diagnosis Date  . Arthritis   . Complication of anesthesia    gets weak after  surgery  . Constipation   . COPD (chronic obstructive pulmonary disease) (HCC)    mild  . Family history of adverse reaction to anesthesia    son gets postop nausea and vomiting, weak  . GERD (gastroesophageal reflux disease)   . Heart murmur   . Hypertension   . Peripheral vascular disease ([place])   . Psoriasis   . Psoriasis   . Skin ulcer of right great toe ([place])   . Ulcer of left ankle ([place])   . Wears dentures     Past Surgical History:  Procedure Laterality Date  . APPLICATION OF A-CELL OF EXTREMITY Left 04/13/2014   Procedure: PLACEMENT OF APPLICATION OF A-CELL ;  Surgeon: Theodoro Kos, DO;  Location: [facility];  Service: Plastics;  Laterality: Left;  . APPLICATION OF A-CELL OF EXTREMITY Left 09/10/2014   Procedure: APPLICATION OF A-CELL OF EXTREMITY;  Surgeon: Theodoro Kos, DO;  Location: [facility];  Service: Plastics;  Laterality: Left;  . CARDIOVASCULAR STRESS TEST  03-03-2014  dr Saralyn Pilar   normal lexi scan sestamibi study/  normal LVF without evidence for significant scar or ischemia  . CATARACT EXTRACTION W/ INTRAOCULAR LENS  IMPLANT, BILATERAL  2015  . CHOLECYSTECTOMY N/A 06/16/2015   Procedure: LAPAROSCOPIC CHOLECYSTECTOMY WITH CHOLANGIOGRAM;  Surgeon: Robert Bellow, MD;  Location: [facility];  Service: General;  Laterality: N/A;  . DILATION AND CURETTAGE OF UTERUS    .  ENDOVASCULAR STENT INSERTION  sept  &  oct  2015   LEFT LEG STENTING--  common and superficial femoral artery  . ENDOVASCULAR STENT INSERTION  May 27, 2014   LifeStent bare metal stent left SFA  . HERNIA REPAIR  10/27/2015   Ventral hernia repaired with Ventrio ST mesh above previous umbilical port site  . I&D EXTREMITY Left 09/10/2014   Procedure: IRRIGATION AND DEBRIDEMENT LEFT ANKLE WOUND SURGICAL PREP ;  Surgeon: Theodoro Kos, DO;  Location: [facility];  Service: Plastics;  Laterality: Left;  . INCISION AND DRAINAGE OF WOUND Bilateral  04/13/2014   Procedure: IRRIGATION AND DEBRIDEMENT OF LEFT ANKLE WOUND AND RIGHT FOOT;  Surgeon: Theodoro Kos, DO;  Location: [facility];  Service: Plastics;  Laterality: Bilateral;  . TRANSTHORACIC ECHOCARDIOGRAM  03-03-2014   normal LVF/  ef 55-60%/  mild TI and MI  . TUBAL LIGATION    . VENTRAL HERNIA REPAIR N/A 10/27/2015   Procedure: HERNIA REPAIR VENTRAL ADULT;  Surgeon: Robert Bellow, MD;  Location: [facility];  Service: General;  Laterality: N/A;    Social History Social History   Tobacco Use  . Smoking status: Former Smoker    Packs/day: 0.50    Years: 42.00    Pack years: 21.00    Types: Cigarettes    Last attempt to quit: 12/24/2013    Years since quitting: 3.5  . Smokeless tobacco: Never Used  . Tobacco comment: no smokers in her home  Substance Use Topics  . Alcohol use: No    Alcohol/week: 0.0 oz  . Drug use: No    Family History Family History  Problem Relation Age of Onset  . Kidney disease Mother   . Hypertension Mother   . Heart disease Mother   . Breast cancer Maternal Aunt 60  . Heart disease Sister   . Stroke Sister   No family history of bleeding/clotting disorders, porphyria or autoimmune disease   Allergies  Allergen Reactions  . Clarithromycin Rash    "Mycin"  . Methocarbamol Rash  . Minocin [Minocycline Hcl] Rash  . Other Rash    Optifoam applied to her left achilles wound.   . Tetracycline Rash     REVIEW OF SYSTEMS (Negative unless checked)  Constitutional: [] Weight loss  [] Fever  [] Chills Cardiac: [] Chest pain   [] Chest pressure   [] Palpitations   [] Shortness of breath when laying flat   [] Shortness of breath at rest   [] Shortness of breath with exertion. Vascular:  [x] Pain in legs with walking   [x] Pain in legs at rest   [] Pain in legs when laying flat   [] Claudication   [] Pain in feet when walking  [] Pain in feet at rest  [] Pain in feet when laying flat   [] History of DVT   [] Phlebitis   [] Swelling in legs    [] Varicose veins   [x] Non-healing ulcers Pulmonary:   [] Uses home oxygen   [] Productive cough   [] Hemoptysis   [] Wheeze  [] COPD   [] Asthma Neurologic:  [] Dizziness  [] Blackouts   [] Seizures   [] History of stroke   [] History of TIA  [] Aphasia   [] Temporary blindness   [] Dysphagia   [] Weakness or numbness in arms   [] Weakness or numbness in legs Musculoskeletal:  [] Arthritis   [] Joint swelling   [] Joint pain   [] Low back pain Hematologic:  [] Easy bruising  [] Easy bleeding   [] Hypercoagulable state   [] Anemic  [] Hepatitis Gastrointestinal:  [] Blood in stool   [] Vomiting blood  [] Gastroesophageal  reflux/heartburn   [] Difficulty swallowing. Genitourinary:  [] Chronic kidney disease   [] Difficult urination  [] Frequent urination  [] Burning with urination   [] Blood in urine Skin:  [] Rashes   [x] Ulcers   [] Wounds Psychological:  [] History of anxiety   []  History of major depression.  Physical Examination  Vitals:   06/26/17 0741  BP: (!) 171/61  Resp: 17  Temp: 97.7 F (36.5 C)  TempSrc: Oral  SpO2: 92%  Weight: 73.5 kg (162 lb)  Height: 5\' 4"  (1.626 m)   Body mass index is 27.81 kg/m. Gen: WD/WN, NAD Head: [place]/AT, No temporalis wasting. Prominent temp pulse not noted. Ear/Nose/Throat: Hearing grossly intact, nares w/o erythema or drainage, oropharynx w/o Erythema/Exudate,  Eyes: Conjunctiva clear, sclera non-icteric Neck: Trachea midline.  No JVD.  Pulmonary:  Good air movement, respirations not labored, no use of accessory muscles.  Cardiac: RRR, normal S1, S2. Vascular: ulcer left ankle Vessel Right Left  Radial Palpable Palpable  PT Not Palpable Not Palpable  DP Not Palpable Not Palpable   Gastrointestinal: soft, non-tender/non-distended. No guarding/reflex.  Musculoskeletal: M/S 5/5 throughout.  Extremities without ischemic changes.  No deformity or atrophy.  Neurologic: Sensation grossly intact in extremities.  Symmetrical.  Speech is fluent. Motor exam as listed  above. Psychiatric: Judgment intact, Mood & affect appropriate for pt's clinical situation. Dermatologic: No rashes or ulcers noted.  No cellulitis or open wounds. Lymph : No Cervical, Axillary, or Inguinal lymphadenopathy.     CBC Lab Results  Component Value Date   WBC 10.4 02/28/2017   HGB 13.3 02/28/2017   HCT 39.0 02/28/2017   MCV 88.2 02/28/2017   PLT 283 02/28/2017    BMET    Component Value Date/Time   NA 138 02/28/2017 1105   NA 128 (L) 05/27/2014 1253   K 4.4 02/28/2017 1105   K 4.2 05/27/2014 1253   CL 106 02/28/2017 1105   CL 97 (L) 05/27/2014 1253   CO2 26 02/28/2017 1105   CO2 25 05/27/2014 1253   GLUCOSE 111 (H) 02/28/2017 1105   GLUCOSE 84 05/27/2014 1253   BUN 12 06/25/2017 1142   BUN 9 05/27/2014 1253   CREATININE 0.78 06/25/2017 1142   CREATININE 0.82 02/28/2017 1105   CALCIUM 9.4 02/28/2017 1105   CALCIUM 9.1 05/27/2014 1253   GFRNONAA >60 06/25/2017 1142   GFRNONAA 72 02/28/2017 1105   GFRAA >60 06/25/2017 1142   GFRAA 84 02/28/2017 1105   Estimated Creatinine Clearance: 63.3 mL/min (by C-G formula based on SCr of 0.78 mg/dL).  COAG Lab Results  Component Value Date   INR 0.9 03/18/2014    Radiology No results found.  Assessment/Plan 1. Atherosclerosis of artery of extremity with ulceration (HCC)  Recommend:  The patient has evidence of severe atherosclerotic changes of both lower extremities associated with ulceration and tissue loss of the foot.  This represents a limb threatening ischemia and places the patient at the risk for limb loss.  Patient should undergo angiography of the lower extremities with the hope for intervention for limb salvage.  The risks and benefits as well as the alternative therapies was discussed in detail with the patient.  All questions were answered.  Patient agrees to proceed with angiography.  The patient will follow up with me in the office after the procedure.    2. Skin ulcer of left ankle,  limited to breakdown of skin ([place])  Recommend:  The patient has evidence of severe atherosclerotic changes of both lower extremities associated with ulceration and  tissue loss of the foot.  This represents a limb threatening ischemia and places the patient at the risk for limb loss.  Patient should undergo angiography of the lower extremities with the hope for intervention for limb salvage.  The risks and benefits as well as the alternative therapies was discussed in detail with the patient.  All questions were answered.  Patient agrees to proceed with angiography.  The patient will follow up with me in the office after the procedure.    3. Brachial-basilar insufficiency syndrome Continue pulmonary medications and aerosols as already ordered, these medications have been reviewed and there are no changes at this time.    4. Essential hypertension Continue antihypertensive medications as already ordered, these medications have been reviewed and there are no changes at this time.       Hortencia Pilar, MD  06/26/2017 8:05 AM

## 2017-06-26 NOTE — Progress Notes (Addendum)
Pt with low BP at 1100. Pt asymptomatic, denies dizziness or SOB, Pt awake alert follows commands. Groin site CDI. NS bolus started. Dr. Ronalee Belts notified, orders received for 250 NS bolus. MD at bedside 1120, BP improved. Also advised per MD to change dressing in left leg, MD aware of bloody drainage, assessed dressing. Pt ok for discharge once bedrest completed

## 2017-06-26 NOTE — Op Note (Signed)
[facility] Percutaneous Study/Intervention Procedural Note   Date of Surgery: 06/26/2017  Surgeon:  Katha Cabal, MD.  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 Introduction catheter into left lower extremity 3rd order catheter placement  2. Contrast injection left lower extremity for distal runoff   3. Percutaneous transluminal angioplasty left superficial femoral artery to 4 mm with a Lutonix drug-eluting balloons  4. Star close closure right common femoral arteriotomy              5.  Excisional debridement left heel ulcer measured 3-1/2 cm x 1-1/2 cm  Anesthesia: Conscious sedation was administered under my direct supervision by the interventional radiology RN. IV Versed plus fentanyl were utilized. Continuous ECG, pulse oximetry and blood pressure was monitored throughout the entire procedure.  Conscious sedation was for a total of 63 minutes.  Sheath: 6 [place] Ansell  Contrast: 75 cc  Fluoroscopy Time: 12.1 minutes  Indications: Tracey Rogers presents with ulceration of the left heel and known atherosclerotic occlusive disease.  Tracey Rogers has had multiple interventions in the past.  The risks and benefits are reviewed all questions answered patient agrees to proceed.  Procedure: Tracey Rogers is a 72 y.o. y.o. female who was identified and appropriate procedural time out was performed. The patient was then placed supine on the table and prepped and draped in the usual sterile fashion.   Ultrasound was placed in the sterile sleeve and the right groin was evaluated the right common femoral artery was echolucent and pulsatile indicating patency.  Image was recorded for the permanent record and under real-time visualization a microneedle was inserted into  the common femoral artery microwire followed by a micro-sheath.  A J-wire was then advanced through the micro-sheath and a  5 [place] sheath was then inserted over a J-wire. J-wire was then advanced and a 5 French pigtail catheter was positioned at the level of T12. AP projection of the aorta was then obtained. Pigtail catheter was repositioned to above the bifurcation and a RAO view of the pelvis was obtained.  Subsequently a V S1 catheter with the stiff angle Glidewire was used to cross the aortic bifurcation the catheter wire were advanced down into the left distal external iliac artery. Oblique view of the femoral bifurcation was then obtained and subsequently the wire was reintroduced and the pigtail catheter negotiated into the SFA representing third order catheter placement. Distal runoff was then performed.  4000 units of heparin was then given and allowed to circulate and a 6 [place] Ansell sheath was advanced up and over the bifurcation and positioned in the femoral artery  KMP  catheter and stiff angle Glidewire were then negotiated down into the distal popliteal.  Distal runoff was then completed by hand injection through the catheter. The wire was then reintroduced and a 4 mm x 4 cm Lutonix drug-eluting balloon was used to angioplasty the mid superficial femoral artery. Inflations were to 12 atmospheres for 2 minutes.  Subsequently the origin of the SFA and the proximal one third of the SFA were treated with balloon angioplasty using 4 mm Lutonix drug-eluting balloons.  Follow-up imaging demonstrated patency with less than 5 % residual stenosis. Distal runoff was then reassessed and found to be unchanged.  After review of these images the sheath is pulled into the right external iliac oblique of the common femoral is obtained and a Star close  device deployed. There no immediate complications.  Following the successful revascularization of the left ankle is prepped and draped in a sterile fashion.   Using an 11 blade scalpel as well as straight scissors the necrotic tissue and eschar of the left heel is sharply debrided.  The ulcer measures 3-1/2 cm in length by 1-1/2 cm in width.  Necrotic skin and subcutaneous tissue is debrided.  Pressure was then held and the wound is dressed with Neosporin followed by gauze and a Kerlix wrap.   Findings: The abdominal aorta is opacified with a bolus injection contrast. Renal arteries are single and widely patent. The aorta itself has diffuse disease but no hemodynamically significant lesions. The common and external iliac arteries are widely patent bilaterally.  The left common femoral is widely patent as is the profunda femoris.  The SFA does indeed have a significant stenosis at multiple locations previous stent is patent but there is a high-grade in-stent restenosis.  The distal popliteal demonstrates moderate disease and the trifurcation is patent with three-vessel runoff to the foot.    Following angioplasty to 4 mm with a Lutonix drug-eluting balloons the SFA is now patent with in-line flow and looks quite nice.     Summary: Successful recanalization left lower extremity for limb salvage    Disposition: Patient was taken to the recovery room in stable condition having tolerated the procedure well.  Tracey Rogers, Tracey Rogers 06/26/2017,9:49 AM

## 2017-07-19 ENCOUNTER — Other Ambulatory Visit (INDEPENDENT_AMBULATORY_CARE_PROVIDER_SITE_OTHER): Payer: Self-pay | Admitting: Vascular Surgery

## 2017-07-19 DIAGNOSIS — I739 Peripheral vascular disease, unspecified: Secondary | ICD-10-CM

## 2017-07-23 ENCOUNTER — Ambulatory Visit (INDEPENDENT_AMBULATORY_CARE_PROVIDER_SITE_OTHER): Payer: Medicare HMO | Admitting: Vascular Surgery

## 2017-07-23 ENCOUNTER — Encounter (INDEPENDENT_AMBULATORY_CARE_PROVIDER_SITE_OTHER): Payer: Self-pay | Admitting: Vascular Surgery

## 2017-07-23 ENCOUNTER — Ambulatory Visit (INDEPENDENT_AMBULATORY_CARE_PROVIDER_SITE_OTHER): Payer: Medicare HMO

## 2017-07-23 VITALS — BP 147/75 | HR 68 | Resp 16 | Wt 161.8 lb

## 2017-07-23 DIAGNOSIS — I10 Essential (primary) hypertension: Secondary | ICD-10-CM

## 2017-07-23 DIAGNOSIS — L97321 Non-pressure chronic ulcer of left ankle limited to breakdown of skin: Secondary | ICD-10-CM | POA: Diagnosis not present

## 2017-07-23 DIAGNOSIS — I739 Peripheral vascular disease, unspecified: Secondary | ICD-10-CM

## 2017-07-23 DIAGNOSIS — I70213 Atherosclerosis of native arteries of extremities with intermittent claudication, bilateral legs: Secondary | ICD-10-CM

## 2017-07-23 DIAGNOSIS — M153 Secondary multiple arthritis: Secondary | ICD-10-CM | POA: Diagnosis not present

## 2017-07-23 NOTE — Progress Notes (Signed)
MRN : [id number]  Tracey Rogers is a 72 y.o. ([birth year]) female who presents with chief complaint of No chief complaint on file. Marland Kitchen  History of Present Illness: The patient returns to the office for followup and review status post angiogram with intervention.   Procedure(s) Performed 06/26/2017: 1. Introduction catheter into left lower extremity 3rd order catheter placement  2. Contrast injection left lower extremity for distal runoff   3. Percutaneous transluminal angioplasty left superficial femoral artery to 4 mm with a Lutonix drug-eluting balloons  4. Star close closure right common femoral arteriotomy              5.  Excisional debridement left heel ulcer measured 3-1/2 cm x 1-1/2 cm  The patient notes improvement in the lower extremity symptoms. No interval shortening of the patient's claudication distance or rest pain symptoms. Previous wounds have now healed.  No new ulcers or wounds have occurred since the last visit.  There have been no significant changes to the patient's overall health care.  The patient denies amaurosis fugax or recent TIA symptoms. There are no recent neurological changes noted. The patient denies history of DVT, PE or superficial thrombophlebitis. The patient denies recent episodes of angina or shortness of breath.   ABI's Rt=0.75 and Lt=0.98  (previous ABI's Rt=0.96 and Lt=0.98)   No outpatient medications have been marked as taking for the 07/23/17 encounter (Appointment) with Delana Meyer, Dolores Lory, MD.    Past Medical History:  Diagnosis Date  . Arthritis   . Complication of anesthesia    gets weak after surgery  . Constipation   . COPD (chronic obstructive pulmonary disease) (HCC)    mild  . Family history of adverse reaction to anesthesia    son gets postop nausea and vomiting, weak  . GERD (gastroesophageal reflux disease)   . Heart murmur   . Hypertension   . Peripheral vascular  disease ([place])   . Psoriasis   . Psoriasis   . Skin ulcer of right great toe ([place])   . Ulcer of left ankle ([place])   . Wears dentures     Past Surgical History:  Procedure Laterality Date  . APPLICATION OF A-CELL OF EXTREMITY Left 04/13/2014   Procedure: PLACEMENT OF APPLICATION OF A-CELL ;  Surgeon: Theodoro Kos, DO;  Location: [facility];  Service: Plastics;  Laterality: Left;  . APPLICATION OF A-CELL OF EXTREMITY Left 09/10/2014   Procedure: APPLICATION OF A-CELL OF EXTREMITY;  Surgeon: Theodoro Kos, DO;  Location: [facility];  Service: Plastics;  Laterality: Left;  . CARDIOVASCULAR STRESS TEST  03-03-2014  dr Saralyn Pilar   normal lexi scan sestamibi study/  normal LVF without evidence for significant scar or ischemia  . CATARACT EXTRACTION W/ INTRAOCULAR LENS  IMPLANT, BILATERAL  2015  . CHOLECYSTECTOMY N/A 06/16/2015   Procedure: LAPAROSCOPIC CHOLECYSTECTOMY WITH CHOLANGIOGRAM;  Surgeon: Robert Bellow, MD;  Location: [facility];  Service: General;  Laterality: N/A;  . DILATION AND CURETTAGE OF UTERUS    . ENDOVASCULAR STENT INSERTION  sept  &  oct  2015   LEFT LEG STENTING--  common and superficial femoral artery  . ENDOVASCULAR STENT INSERTION  May 27, 2014   LifeStent bare metal stent left SFA  . HERNIA REPAIR  10/27/2015   Ventral hernia repaired with Ventrio ST mesh above previous umbilical port site  . I&D EXTREMITY Left 09/10/2014   Procedure: IRRIGATION AND DEBRIDEMENT LEFT ANKLE WOUND SURGICAL PREP ;  Surgeon: Lyndee Leo  Sanger, DO;  Location: [facility];  Service: Clinical cytogeneticist;  Laterality: Left;  . INCISION AND DRAINAGE OF WOUND Bilateral 04/13/2014   Procedure: IRRIGATION AND DEBRIDEMENT OF LEFT ANKLE WOUND AND RIGHT FOOT;  Surgeon: Theodoro Kos, DO;  Location: [facility];  Service: Plastics;  Laterality: Bilateral;  . LOWER EXTREMITY ANGIOGRAPHY Left 06/26/2017   Procedure: LOWER EXTREMITY ANGIOGRAPHY;  Surgeon:  Katha Cabal, MD;  Location: [facility];  Service: Cardiovascular;  Laterality: Left;  . TRANSTHORACIC ECHOCARDIOGRAM  03-03-2014   normal LVF/  ef 55-60%/  mild TI and MI  . TUBAL LIGATION    . VENTRAL HERNIA REPAIR N/A 10/27/2015   Procedure: HERNIA REPAIR VENTRAL ADULT;  Surgeon: Robert Bellow, MD;  Location: [facility];  Service: General;  Laterality: N/A;    Social History Social History   Tobacco Use  . Smoking status: Former Smoker    Packs/day: 0.50    Years: 42.00    Pack years: 21.00    Types: Cigarettes    Last attempt to quit: 12/24/2013    Years since quitting: 3.5  . Smokeless tobacco: Never Used  . Tobacco comment: no smokers in her home  Substance Use Topics  . Alcohol use: No    Alcohol/week: 0.0 oz  . Drug use: No    Family History Family History  Problem Relation Age of Onset  . Kidney disease Mother   . Hypertension Mother   . Heart disease Mother   . Breast cancer Maternal Aunt 60  . Heart disease Sister   . Stroke Sister     Allergies  Allergen Reactions  . Clarithromycin Rash    "Mycin"  . Methocarbamol Rash  . Minocin [Minocycline Hcl] Rash  . Other Rash    Optifoam applied to her left achilles wound.   . Tetracycline Rash     REVIEW OF SYSTEMS (Negative unless checked)  Constitutional: [] Weight loss  [] Fever  [] Chills Cardiac: [] Chest pain   [] Chest pressure   [] Palpitations   [] Shortness of breath when laying flat   [] Shortness of breath with exertion. Vascular:  [x] Pain in legs with walking   [] Pain in legs at rest  [] History of DVT   [] Phlebitis   [x] Swelling in legs   [] Varicose veins   [x] Non-healing ulcers Pulmonary:   [] Uses home oxygen   [] Productive cough   [] Hemoptysis   [] Wheeze  [] COPD   [] Asthma Neurologic:  [] Dizziness   [] Seizures   [] History of stroke   [] History of TIA  [] Aphasia   [] Vissual changes   [] Weakness or numbness in arm   [] Weakness or numbness in leg Musculoskeletal:   [] Joint swelling    [] Joint pain   [] Low back pain Hematologic:  [] Easy bruising  [] Easy bleeding   [] Hypercoagulable state   [] Anemic Gastrointestinal:  [] Diarrhea   [] Vomiting  [] Gastroesophageal reflux/heartburn   [] Difficulty swallowing. Genitourinary:  [] Chronic kidney disease   [] Difficult urination  [] Frequent urination   [] Blood in urine Skin:  [x] Rashes   [x] Ulcers  Psychological:  [] History of anxiety   []  History of major depression.  Physical Examination  There were no vitals filed for this visit. There is no height or weight on file to calculate BMI. Gen: WD/WN, NAD Head: Edie/AT, No temporalis wasting.  Ear/Nose/Throat: Hearing grossly intact, nares w/o erythema or drainage Eyes: PER, EOMI, sclera nonicteric.  Neck: Supple, no large masses.   Pulmonary:  Good air movement, no audible wheezing bilaterally, no use of accessory muscles.  Cardiac: RRR, no JVD Vascular:  Left superficial ulcer over the heel clean noninfected Vessel Right Left  Radial Palpable Palpable  PT Not Palpable Not Palpable  DP Not Palpable Not Palpable  Gastrointestinal: Non-distended. No guarding/no peritoneal signs.  Musculoskeletal: M/S 5/5 throughout.  No deformity or atrophy.  Neurologic: CN 2-12 intact. Symmetrical.  Speech is fluent. Motor exam as listed above. Psychiatric: Judgment intact, Mood & affect appropriate for pt's clinical situation. Dermatologic: venous rashes with ulcers noted.  No changes consistent with cellulitis. Lymph : No lichenification or skin changes of chronic lymphedema.  CBC Lab Results  Component Value Date   WBC 10.4 02/28/2017   HGB 13.3 02/28/2017   HCT 39.0 02/28/2017   MCV 88.2 02/28/2017   PLT 283 02/28/2017    BMET    Component Value Date/Time   NA 138 02/28/2017 1105   NA 128 (L) 05/27/2014 1253   K 4.4 02/28/2017 1105   K 4.2 05/27/2014 1253   CL 106 02/28/2017 1105   CL 97 (L) 05/27/2014 1253   CO2 26 02/28/2017 1105   CO2 25 05/27/2014 1253   GLUCOSE 111 (H)  02/28/2017 1105   GLUCOSE 84 05/27/2014 1253   BUN 12 06/25/2017 1142   BUN 9 05/27/2014 1253   CREATININE 0.78 06/25/2017 1142   CREATININE 0.82 02/28/2017 1105   CALCIUM 9.4 02/28/2017 1105   CALCIUM 9.1 05/27/2014 1253   GFRNONAA >60 06/25/2017 1142   GFRNONAA 72 02/28/2017 1105   GFRAA >60 06/25/2017 1142   GFRAA 84 02/28/2017 1105   CrCl cannot be calculated (Patient's most recent lab result is older than the maximum 21 days allowed.).  COAG Lab Results  Component Value Date   INR 0.9 03/18/2014    Radiology No results found.   Assessment/Plan 1. Atherosclerosis of native artery of both lower extremities with intermittent claudication (HCC) Recommend:  The patient is status post successful angiogram with intervention.  The patient reports that the claudication symptoms and leg pain is essentially gone.   The patient denies lifestyle limiting changes at this point in time.  No further invasive studies, angiography or surgery at this time The patient should continue walking and begin a more formal exercise program.  The patient should continue antiplatelet therapy and aggressive treatment of the lipid abnormalities  The patient should continue wearing graduated compression socks 10-15 mmHg strength to control the mild edema.  Patient should undergo noninvasive studies as ordered. The patient will follow up with me after the studies.   - VAS [place] ABI WITH/WO TBI; Future - VAS [place] LOWER EXTREMITY ARTERIAL DUPLEX; Future  2. Skin ulcer of left ankle, limited to breakdown of skin ([place]) Continue dressing changes daily  3. Essential hypertension Continue antihypertensive medications as already ordered, these medications have been reviewed and there are no changes at this time.   4. Other secondary osteoarthritis of multiple sites Continue NSAID medications as already ordered, these medications have been reviewed and there are no changes at this time.  Continued activity  and therapy was stressed.    Hortencia Pilar, MD  07/23/2017 8:15 AM

## 2017-08-15 ENCOUNTER — Other Ambulatory Visit (INDEPENDENT_AMBULATORY_CARE_PROVIDER_SITE_OTHER): Payer: Self-pay | Admitting: Vascular Surgery

## 2017-08-20 ENCOUNTER — Encounter (INDEPENDENT_AMBULATORY_CARE_PROVIDER_SITE_OTHER): Payer: Medicare HMO

## 2017-08-20 ENCOUNTER — Ambulatory Visit (INDEPENDENT_AMBULATORY_CARE_PROVIDER_SITE_OTHER): Payer: Medicare HMO | Admitting: Vascular Surgery

## 2017-10-25 ENCOUNTER — Ambulatory Visit (INDEPENDENT_AMBULATORY_CARE_PROVIDER_SITE_OTHER): Payer: Medicare HMO | Admitting: Vascular Surgery

## 2017-10-25 ENCOUNTER — Encounter (INDEPENDENT_AMBULATORY_CARE_PROVIDER_SITE_OTHER): Payer: Self-pay | Admitting: Vascular Surgery

## 2017-10-25 ENCOUNTER — Ambulatory Visit (INDEPENDENT_AMBULATORY_CARE_PROVIDER_SITE_OTHER): Payer: Medicare HMO

## 2017-10-25 VITALS — BP 155/73 | HR 72 | Resp 16 | Ht 64.0 in | Wt 166.0 lb

## 2017-10-25 DIAGNOSIS — I70213 Atherosclerosis of native arteries of extremities with intermittent claudication, bilateral legs: Secondary | ICD-10-CM

## 2017-10-25 DIAGNOSIS — I6523 Occlusion and stenosis of bilateral carotid arteries: Secondary | ICD-10-CM | POA: Diagnosis not present

## 2017-10-25 DIAGNOSIS — I872 Venous insufficiency (chronic) (peripheral): Secondary | ICD-10-CM

## 2017-10-25 DIAGNOSIS — I10 Essential (primary) hypertension: Secondary | ICD-10-CM

## 2017-11-02 ENCOUNTER — Encounter (INDEPENDENT_AMBULATORY_CARE_PROVIDER_SITE_OTHER): Payer: Self-pay | Admitting: Vascular Surgery

## 2017-11-02 DIAGNOSIS — I6529 Occlusion and stenosis of unspecified carotid artery: Secondary | ICD-10-CM | POA: Insufficient documentation

## 2017-11-02 DIAGNOSIS — I872 Venous insufficiency (chronic) (peripheral): Secondary | ICD-10-CM | POA: Insufficient documentation

## 2017-11-02 NOTE — Progress Notes (Signed)
MRN : [id number]  Tracey Rogers is a 72 y.o. (11-03-1945) female who presents with chief complaint of  Chief Complaint  Patient presents with  . Follow-up    10mo abi,bil le arterial  .  History of Present Illness:   The patient returns to the office for followup and review of the noninvasive studies. There have been no interval changes in lower extremity symptoms. No interval shortening of the patient's claudication distance or development of rest pain symptoms. No new ulcers or wounds have occurred since the last visit.  There have been no significant changes to the patient's overall health care.  The patient denies amaurosis fugax or recent TIA symptoms. There are no recent neurological changes noted. The patient denies history of DVT, PE or superficial thrombophlebitis. The patient denies recent episodes of angina or shortness of breath.   ABI Rt=0.81 and Lt=0.95  (previous ABI's Rt=0.75 and Lt=0.98) Duplex ultrasound of the bilateral lower extremities moderate SFA stenosis bilaterally stable and <60%  Current Meds  Medication Sig  . acetaminophen (TYLENOL) 325 MG tablet Take 325 mg by mouth every 6 (six) hours as needed for moderate pain or headache.   . alendronate (FOSAMAX) 70 MG tablet TAKE ONE TABLET EVERY WEEK AT LEAST 30 MINUTES BEFORE FOOD OR BEVERAGE WITH A LITTLE WATER  . amLODipine (NORVASC) 10 MG tablet Take 1 tablet (10 mg total) by mouth daily. (Patient taking differently: Take 10 mg by mouth every evening. )  . aspirin EC 81 MG tablet Take 81 mg by mouth every evening.  Marland Kitchen atorvastatin (LIPITOR) 10 MG tablet Take 1 tablet (10 mg total) by mouth daily. (Patient taking differently: Take 10 mg by mouth every evening. )  . clopidogrel (PLAVIX) 75 MG tablet Take 1 tablet (75 mg total) by mouth daily. (Patient taking differently: Take 75 mg by mouth every evening. )  . fluticasone (FLONASE) 50 MCG/ACT nasal spray Place 2 sprays into both nostrils daily. (Patient  taking differently: Place 2 sprays into both nostrils daily as needed for allergies. )  . Magnesium 400 MG CAPS Take 400 mg by mouth daily as needed (cramps).   . sertraline (ZOLOFT) 25 MG tablet Take 1 tablet (25 mg total) by mouth at bedtime.  . silver sulfADIAZINE (SILVADENE) 1 % cream APPLY TO LEFT HEEL DAILY AND COVER WITH LIGHT GAUZE.  . triamcinolone ointment (KENALOG) 0.1 % Apply 1 application topically 2 (two) times daily as needed.    Past Medical History:  Diagnosis Date  . Arthritis   . Complication of anesthesia    gets weak after surgery  . Constipation   . COPD (chronic obstructive pulmonary disease) (HCC)    mild  . Family history of adverse reaction to anesthesia    son gets postop nausea and vomiting, weak  . GERD (gastroesophageal reflux disease)   . Heart murmur   . Hypertension   . Peripheral vascular disease ([place])   . Psoriasis   . Psoriasis   . Skin ulcer of right great toe ([place])   . Ulcer of left ankle ([place])   . Wears dentures     Past Surgical History:  Procedure Laterality Date  . APPLICATION OF A-CELL OF EXTREMITY Left 04/13/2014   Procedure: PLACEMENT OF APPLICATION OF A-CELL ;  Surgeon: Theodoro Kos, DO;  Location: [place];  Service: Plastics;  Laterality: Left;  . APPLICATION OF A-CELL OF EXTREMITY Left 09/10/2014   Procedure: APPLICATION OF A-CELL OF EXTREMITY;  Surgeon: Lyndee Leo  Sanger, DO;  Location: [place];  Service: Clinical cytogeneticist;  Laterality: Left;  . CARDIOVASCULAR STRESS TEST  03-03-2014  dr Saralyn Pilar   normal lexi scan sestamibi study/  normal LVF without evidence for significant scar or ischemia  . CATARACT EXTRACTION W/ INTRAOCULAR LENS  IMPLANT, BILATERAL  2015  . CHOLECYSTECTOMY N/A 06/16/2015   Procedure: LAPAROSCOPIC CHOLECYSTECTOMY WITH CHOLANGIOGRAM;  Surgeon: Robert Bellow, MD;  Location: [facility];  Service: General;  Laterality: N/A;  . DILATION AND CURETTAGE OF UTERUS    . ENDOVASCULAR STENT  INSERTION  sept  &  oct  2015   LEFT LEG STENTING--  common and superficial femoral artery  . ENDOVASCULAR STENT INSERTION  May 27, 2014   LifeStent bare metal stent left SFA  . HERNIA REPAIR  10/27/2015   Ventral hernia repaired with Ventrio ST mesh above previous umbilical port site  . I&D EXTREMITY Left 09/10/2014   Procedure: IRRIGATION AND DEBRIDEMENT LEFT ANKLE WOUND SURGICAL PREP ;  Surgeon: Theodoro Kos, DO;  Location: [place];  Service: Plastics;  Laterality: Left;  . INCISION AND DRAINAGE OF WOUND Bilateral 04/13/2014   Procedure: IRRIGATION AND DEBRIDEMENT OF LEFT ANKLE WOUND AND RIGHT FOOT;  Surgeon: Theodoro Kos, DO;  Location: [place];  Service: Plastics;  Laterality: Bilateral;  . LOWER EXTREMITY ANGIOGRAPHY Left 06/26/2017   Procedure: LOWER EXTREMITY ANGIOGRAPHY;  Surgeon: Katha Cabal, MD;  Location: [place] [facility];  Service: Cardiovascular;  Laterality: Left;  . TRANSTHORACIC ECHOCARDIOGRAM  03-03-2014   normal LVF/  ef 55-60%/  mild TI and MI  . TUBAL LIGATION    . VENTRAL HERNIA REPAIR N/A 10/27/2015   Procedure: HERNIA REPAIR VENTRAL ADULT;  Surgeon: Robert Bellow, MD;  Location: [facility];  Service: General;  Laterality: N/A;    Social History Social History   Tobacco Use  . Smoking status: Former Smoker    Packs/day: 0.50    Years: 42.00    Pack years: 21.00    Types: Cigarettes    Last attempt to quit: 12/24/2013    Years since quitting: 3.8  . Smokeless tobacco: Never Used  . Tobacco comment: no smokers in her home  Substance Use Topics  . Alcohol use: No    Alcohol/week: 0.0 oz  . Drug use: No    Family History Family History  Problem Relation Age of Onset  . Kidney disease Mother   . Hypertension Mother   . Heart disease Mother   . Breast cancer Maternal Aunt 60  . Heart disease Sister   . Stroke Sister     Allergies  Allergen Reactions  . Clarithromycin Rash    "Mycin"  .  Methocarbamol Rash  . Minocin [Minocycline Hcl] Rash  . Other Rash    Optifoam applied to her left achilles wound.   . Tetracycline Rash     REVIEW OF SYSTEMS (Negative unless checked)  Constitutional: [] Weight loss  [] Fever  [] Chills Cardiac: [] Chest pain   [] Chest pressure   [] Palpitations   [] Shortness of breath when laying flat   [] Shortness of breath with exertion. Vascular:  [x] Pain in legs with walking   [] Pain in legs at rest  [] History of DVT   [] Phlebitis   [x] Swelling in legs   [] Varicose veins   [] Non-healing ulcers Pulmonary:   [] Uses home oxygen   [] Productive cough   [] Hemoptysis   [] Wheeze  [] COPD   [] Asthma Neurologic:  [] Dizziness   [] Seizures   [] History of  stroke   [] History of TIA  [] Aphasia   [] Vissual changes   [] Weakness or numbness in arm   [] Weakness or numbness in leg Musculoskeletal:   [] Joint swelling   [] Joint pain   [] Low back pain Hematologic:  [] Easy bruising  [] Easy bleeding   [] Hypercoagulable state   [] Anemic Gastrointestinal:  [] Diarrhea   [] Vomiting  [] Gastroesophageal reflux/heartburn   [] Difficulty swallowing. Genitourinary:  [] Chronic kidney disease   [] Difficult urination  [] Frequent urination   [] Blood in urine Skin:  [] Rashes   [] Ulcers  Psychological:  [] History of anxiety   []  History of major depression.  Physical Examination  Vitals:   10/25/17 1031  BP: (!) 155/73  Pulse: 72  Resp: 16  Weight: 166 lb (75.3 kg)  Height: 5\' 4"  (1.626 m)   Body mass index is 28.49 kg/m. Gen: WD/WN, NAD Head: [place]/AT, No temporalis wasting.  Ear/Nose/Throat: Hearing grossly intact, nares w/o erythema or drainage Eyes: PER, EOMI, sclera nonicteric.  Neck: Supple, no large masses.   Pulmonary:  Good air movement, no audible wheezing bilaterally, no use of accessory muscles.  Cardiac: RRR, no JVD Vascular: left ankle callous present uninfected Vessel Right Left  Radial Palpable Palpable  PT Not Palpable Not Palpable  DP Not Palpable Not Palpable    Gastrointestinal: Non-distended. No guarding/no peritoneal signs.  Musculoskeletal: M/S 5/5 throughout.  No deformity or atrophy.  Neurologic: CN 2-12 intact. Symmetrical.  Speech is fluent. Motor exam as listed above. Psychiatric: Judgment intact, Mood & affect appropriate for pt's clinical situation. Dermatologic: No rashes or ulcers noted.  No changes consistent with cellulitis. Lymph : No lichenification or skin changes of chronic lymphedema.  CBC Lab Results  Component Value Date   WBC 10.4 02/28/2017   HGB 13.3 02/28/2017   HCT 39.0 02/28/2017   MCV 88.2 02/28/2017   PLT 283 02/28/2017    BMET    Component Value Date/Time   NA 138 02/28/2017 1105   NA 128 (L) 05/27/2014 1253   K 4.4 02/28/2017 1105   K 4.2 05/27/2014 1253   CL 106 02/28/2017 1105   CL 97 (L) 05/27/2014 1253   CO2 26 02/28/2017 1105   CO2 25 05/27/2014 1253   GLUCOSE 111 (H) 02/28/2017 1105   GLUCOSE 84 05/27/2014 1253   BUN 12 06/25/2017 1142   BUN 9 05/27/2014 1253   CREATININE 0.78 06/25/2017 1142   CREATININE 0.82 02/28/2017 1105   CALCIUM 9.4 02/28/2017 1105   CALCIUM 9.1 05/27/2014 1253   GFRNONAA >60 06/25/2017 1142   GFRNONAA 72 02/28/2017 1105   GFRAA >60 06/25/2017 1142   GFRAA 84 02/28/2017 1105   CrCl cannot be calculated (Patient's most recent lab result is older than the maximum 21 days allowed.).  COAG Lab Results  Component Value Date   INR 0.9 03/18/2014    Radiology No results found.    Assessment/Plan 1. Atherosclerosis of native artery of both lower extremities with intermittent claudication (HCC)  Recommend:  The patient has evidence of atherosclerosis of the lower extremities with claudication.  The patient does not voice lifestyle limiting changes at this point in time.  Noninvasive studies do not suggest clinically significant change.  No invasive studies, angiography or surgery at this time The patient should continue walking and begin a more formal  exercise program.  The patient should continue antiplatelet therapy and aggressive treatment of the lipid abnormalities  No changes in the patient's medications at this time  The patient should continue wearing graduated compression socks  10-15 mmHg strength to control the mild edema.   - VAS [place] LOWER EXTREMITY ARTERIAL DUPLEX; Future - VAS [place] ABI WITH/WO TBI; Future  2. Bilateral carotid artery stenosis Recommend:  Given the patient's asymptomatic subcritical stenosis no further invasive testing or surgery at this time.  Duplex ultrasound shows <70% stenosis bilaterally.  Continue antiplatelet therapy as prescribed Continue management of CAD, HTN and Hyperlipidemia Healthy heart diet,  encouraged exercise at least 4 times per week Follow up in 6 months with duplex ultrasound and physical exam   - VAS US CAROTID; Future  3. Essential hypertension Continue antihypertensive medications as already ordered, these medications have been reviewed and there are no changes at this time.   4. Chronic venous insufficiency  No surgery or intervention at this point in time.    I have reviewed my previous discussion with the patient regarding swelling and why it  causes symptoms.  The patient is doing well with compression and will continue wearing graduated compression stockings on a daily basis a prescription was given. The patient will  continue wearing the stockings first thing in the morning and removing them in the evening. The patient is instructed specifically not to sleep in the stockings.    In addition, behavioral modification including elevation during the day and exercise will be continued.      Hortencia Pilar, MD  11/02/2017 6:07 AM

## 2017-12-03 ENCOUNTER — Other Ambulatory Visit: Payer: Self-pay | Admitting: Physician Assistant

## 2017-12-03 DIAGNOSIS — F324 Major depressive disorder, single episode, in partial remission: Secondary | ICD-10-CM

## 2018-01-31 ENCOUNTER — Telehealth: Payer: Self-pay | Admitting: Physician Assistant

## 2018-01-31 NOTE — Telephone Encounter (Signed)
I left a message asking the pt to call me at [phone] to schedule AWV-S w/ McKenzie after 02/28/18. VDM (DD)

## 2018-03-11 ENCOUNTER — Other Ambulatory Visit: Payer: Self-pay | Admitting: Physician Assistant

## 2018-03-11 DIAGNOSIS — I10 Essential (primary) hypertension: Secondary | ICD-10-CM

## 2018-03-11 DIAGNOSIS — I70213 Atherosclerosis of native arteries of extremities with intermittent claudication, bilateral legs: Secondary | ICD-10-CM

## 2018-03-14 ENCOUNTER — Other Ambulatory Visit: Payer: Self-pay | Admitting: Physician Assistant

## 2018-03-14 DIAGNOSIS — E7800 Pure hypercholesterolemia, unspecified: Secondary | ICD-10-CM

## 2018-03-22 ENCOUNTER — Other Ambulatory Visit: Payer: Self-pay | Admitting: Physician Assistant

## 2018-03-22 DIAGNOSIS — Z1231 Encounter for screening mammogram for malignant neoplasm of breast: Secondary | ICD-10-CM

## 2018-04-11 ENCOUNTER — Telehealth: Payer: Self-pay | Admitting: Physician Assistant

## 2018-04-11 NOTE — Telephone Encounter (Signed)
I called the patient to schedule her AWV-S with McKenzie.  She said that she will call back to schedule it. Last AWV 02/28/17. VDM (DD)

## 2018-04-22 ENCOUNTER — Ambulatory Visit
Admission: RE | Admit: 2018-04-22 | Discharge: 2018-04-22 | Disposition: A | Discharge status: Home / Self Care | Payer: Medicare HMO | Source: Ambulatory Visit | Attending: Physician Assistant | Admitting: Physician Assistant

## 2018-04-22 DIAGNOSIS — Z1231 Encounter for screening mammogram for malignant neoplasm of breast: Secondary | ICD-10-CM | POA: Insufficient documentation

## 2018-04-23 ENCOUNTER — Telehealth: Payer: Self-pay

## 2018-04-23 NOTE — Telephone Encounter (Signed)
-----   Message from Mar Daring, [place] sent at 04/22/2018  3:39 PM EST ----- Normal mammogram. Repeat screening in one year.

## 2018-04-23 NOTE — Telephone Encounter (Signed)
Patient was advised.  

## 2018-04-29 ENCOUNTER — Ambulatory Visit (INDEPENDENT_AMBULATORY_CARE_PROVIDER_SITE_OTHER): Payer: Medicare HMO

## 2018-04-29 ENCOUNTER — Encounter (INDEPENDENT_AMBULATORY_CARE_PROVIDER_SITE_OTHER): Payer: Self-pay | Admitting: Vascular Surgery

## 2018-04-29 ENCOUNTER — Ambulatory Visit (INDEPENDENT_AMBULATORY_CARE_PROVIDER_SITE_OTHER): Payer: Medicare HMO | Admitting: Vascular Surgery

## 2018-04-29 VITALS — BP 158/75 | HR 66 | Resp 16 | Ht 64.0 in | Wt 157.0 lb

## 2018-04-29 DIAGNOSIS — I10 Essential (primary) hypertension: Secondary | ICD-10-CM | POA: Diagnosis not present

## 2018-04-29 DIAGNOSIS — I70213 Atherosclerosis of native arteries of extremities with intermittent claudication, bilateral legs: Secondary | ICD-10-CM

## 2018-04-29 DIAGNOSIS — I6523 Occlusion and stenosis of bilateral carotid arteries: Secondary | ICD-10-CM

## 2018-04-29 DIAGNOSIS — M153 Secondary multiple arthritis: Secondary | ICD-10-CM | POA: Diagnosis not present

## 2018-04-29 DIAGNOSIS — I872 Venous insufficiency (chronic) (peripheral): Secondary | ICD-10-CM | POA: Diagnosis not present

## 2018-04-29 NOTE — Progress Notes (Signed)
MRN : [id number]  Tracey Rogers is a 72 y.o. (1945/07/09) female who presents with chief complaint of No chief complaint on file. Marland Kitchen  History of Present Illness:   The patient returns to the office for followup and review of the noninvasive studies. There have been no interval changes in lower extremity symptoms. No interval shortening of the patient's claudication distance or development of rest pain symptoms. No new ulcers or wounds have occurred since the last visit.  There have been no significant changes to the patient's overall health care.  The patient denies amaurosis fugax or recent TIA symptoms. There are no recent neurological changes noted.  The patient denies history of DVT, PE or superficial thrombophlebitis. The patient denies recent episodes of angina or shortness of breath.   ABI Rt=1.17 and Lt=1.13 (previous ABI's Rt=0.81 and Lt=0.95) Duplex ultrasound of the bilateral lower extremities moderate SFA stenosis bilaterally stable and <60%  Duplex ultrasound of the carotid arteries the right internal carotid artery demonstrates a string sign and the left internal carotid artery remains 60 to 70% stenotic.  Significant worsening of the atherosclerotic narrowing on the right, no change compared to last study on the left.  No outpatient medications have been marked as taking for the 04/29/18 encounter (Appointment) with Delana Meyer, Dolores Lory, MD.    Past Medical History:  Diagnosis Date  . Arthritis   . Complication of anesthesia    gets weak after surgery  . Constipation   . COPD (chronic obstructive pulmonary disease) (HCC)    mild  . Family history of adverse reaction to anesthesia    son gets postop nausea and vomiting, weak  . GERD (gastroesophageal reflux disease)   . Heart murmur   . Hypertension   . Peripheral vascular disease ([place])   . Psoriasis   . Psoriasis   . Skin ulcer of right great toe ([place])   . Ulcer of left ankle ([place])   . Wears dentures      Past Surgical History:  Procedure Laterality Date  . APPLICATION OF A-CELL OF EXTREMITY Left 04/13/2014   Procedure: PLACEMENT OF APPLICATION OF A-CELL ;  Surgeon: Theodoro Kos, DO;  Location: [place];  Service: Plastics;  Laterality: Left;  . APPLICATION OF A-CELL OF EXTREMITY Left 09/10/2014   Procedure: APPLICATION OF A-CELL OF EXTREMITY;  Surgeon: Theodoro Kos, DO;  Location: [place];  Service: Plastics;  Laterality: Left;  . CARDIOVASCULAR STRESS TEST  03-03-2014  dr Saralyn Pilar   normal lexi scan sestamibi study/  normal LVF without evidence for significant scar or ischemia  . CATARACT EXTRACTION W/ INTRAOCULAR LENS  IMPLANT, BILATERAL  2015  . CHOLECYSTECTOMY N/A 06/16/2015   Procedure: LAPAROSCOPIC CHOLECYSTECTOMY WITH CHOLANGIOGRAM;  Surgeon: Robert Bellow, MD;  Location: [facility];  Service: General;  Laterality: N/A;  . DILATION AND CURETTAGE OF UTERUS    . ENDOVASCULAR STENT INSERTION  sept  &  oct  2015   LEFT LEG STENTING--  common and superficial femoral artery  . ENDOVASCULAR STENT INSERTION  May 27, 2014   LifeStent bare metal stent left SFA  . HERNIA REPAIR  10/27/2015   Ventral hernia repaired with Ventrio ST mesh above previous umbilical port site  . I&D EXTREMITY Left 09/10/2014   Procedure: IRRIGATION AND DEBRIDEMENT LEFT ANKLE WOUND SURGICAL PREP ;  Surgeon: Theodoro Kos, DO;  Location: [place];  Service: Plastics;  Laterality: Left;  . INCISION AND DRAINAGE OF WOUND Bilateral 04/13/2014  Procedure: IRRIGATION AND DEBRIDEMENT OF LEFT ANKLE WOUND AND RIGHT FOOT;  Surgeon: Theodoro Kos, DO;  Location: [place];  Service: Plastics;  Laterality: Bilateral;  . LOWER EXTREMITY ANGIOGRAPHY Left 06/26/2017   Procedure: LOWER EXTREMITY ANGIOGRAPHY;  Surgeon: Katha Cabal, MD;  Location: [place] [facility];  Service: Cardiovascular;  Laterality: Left;  . TRANSTHORACIC ECHOCARDIOGRAM   03-03-2014   normal LVF/  ef 55-60%/  mild TI and MI  . TUBAL LIGATION    . VENTRAL HERNIA REPAIR N/A 10/27/2015   Procedure: HERNIA REPAIR VENTRAL ADULT;  Surgeon: Robert Bellow, MD;  Location: [facility];  Service: General;  Laterality: N/A;    Social History Social History   Tobacco Use  . Smoking status: Former Smoker    Packs/day: 0.50    Years: 42.00    Pack years: 21.00    Types: Cigarettes    Last attempt to quit: 12/24/2013    Years since quitting: 4.3  . Smokeless tobacco: Never Used  . Tobacco comment: no smokers in her home  Substance Use Topics  . Alcohol use: No    Alcohol/week: 0.0 standard drinks  . Drug use: No    Family History Family History  Problem Relation Age of Onset  . Kidney disease Mother   . Hypertension Mother   . Heart disease Mother   . Breast cancer Maternal Aunt 60  . Heart disease Sister   . Stroke Sister     Allergies  Allergen Reactions  . Clarithromycin Rash    "Mycin"  . Methocarbamol Rash  . Minocin [Minocycline Hcl] Rash  . Other Rash    Optifoam applied to her left achilles wound.   . Tetracycline Rash     REVIEW OF SYSTEMS (Negative unless checked)  Constitutional: [] Weight loss  [] Fever  [] Chills Cardiac: [] Chest pain   [] Chest pressure   [] Palpitations   [] Shortness of breath when laying flat   [] Shortness of breath with exertion. Vascular:  [x] Pain in legs with walking   [] Pain in legs at rest  [] History of DVT   [] Phlebitis   [] Swelling in legs   [] Varicose veins   [] Non-healing ulcers Pulmonary:   [] Uses home oxygen   [] Productive cough   [] Hemoptysis   [] Wheeze  [] COPD   [] Asthma Neurologic:  [] Dizziness   [] Seizures   [] History of stroke   [] History of TIA  [] Aphasia   [] Vissual changes   [] Weakness or numbness in arm   [x] Weakness or numbness in leg Musculoskeletal:   [] Joint swelling   [x] Joint pain   [] Low back pain Hematologic:  [] Easy bruising  [] Easy bleeding   [] Hypercoagulable state    [] Anemic Gastrointestinal:  [] Diarrhea   [] Vomiting  [] Gastroesophageal reflux/heartburn   [] Difficulty swallowing. Genitourinary:  [] Chronic kidney disease   [] Difficult urination  [] Frequent urination   [] Blood in urine Skin:  [] Rashes   [] Ulcers  Psychological:  [] History of anxiety   []  History of major depression.  Physical Examination  There were no vitals filed for this visit. There is no height or weight on file to calculate BMI. Gen: WD/WN, NAD Head: [place]/AT, No temporalis wasting.  Ear/Nose/Throat: Hearing grossly intact, nares w/o erythema or drainage Eyes: PER, EOMI, sclera nonicteric.  Neck: Supple, no large masses.   Pulmonary:  Good air movement, no audible wheezing bilaterally, no use of accessory muscles.  Cardiac: RRR, no JVD Vascular:  No carotid bruits noted Vessel Right Left  Radial Palpable Palpable  Brachial Palpable Palpable  Carotid Palpable  Palpable  PT Trace Palpable Trace Palpable  DP Trace Palpable Trace Palpable  Gastrointestinal: Non-distended. No guarding/no peritoneal signs.  Musculoskeletal: M/S 5/5 throughout.  No deformity or atrophy.  Neurologic: CN 2-12 intact. Symmetrical.  Speech is fluent. Motor exam as listed above. Psychiatric: Judgment intact, Mood & affect appropriate for pt's clinical situation. Dermatologic: No rashes or ulcers noted.  No changes consistent with cellulitis. Lymph : No lichenification or skin changes of chronic lymphedema.  CBC Lab Results  Component Value Date   WBC 10.4 02/28/2017   HGB 13.3 02/28/2017   HCT 39.0 02/28/2017   MCV 88.2 02/28/2017   PLT 283 02/28/2017    BMET    Component Value Date/Time   NA 138 02/28/2017 1105   NA 128 (L) 05/27/2014 1253   K 4.4 02/28/2017 1105   K 4.2 05/27/2014 1253   CL 106 02/28/2017 1105   CL 97 (L) 05/27/2014 1253   CO2 26 02/28/2017 1105   CO2 25 05/27/2014 1253   GLUCOSE 111 (H) 02/28/2017 1105   GLUCOSE 84 05/27/2014 1253   BUN 12 06/25/2017 1142   BUN 9  05/27/2014 1253   CREATININE 0.78 06/25/2017 1142   CREATININE 0.82 02/28/2017 1105   CALCIUM 9.4 02/28/2017 1105   CALCIUM 9.1 05/27/2014 1253   GFRNONAA >60 06/25/2017 1142   GFRNONAA 72 02/28/2017 1105   GFRAA >60 06/25/2017 1142   GFRAA 84 02/28/2017 1105   CrCl cannot be calculated (Patient's most recent lab result is older than the maximum 21 days allowed.).  COAG Lab Results  Component Value Date   INR 0.9 03/18/2014    Radiology Mm 3d Screen Breast Bilateral  Result Date: 04/22/2018 CLINICAL DATA:  Screening. EXAM: DIGITAL SCREENING BILATERAL MAMMOGRAM WITH TOMO AND CAD COMPARISON:  Previous exam(s). ACR Breast Density Category b: There are scattered areas of fibroglandular density. FINDINGS: There are no findings suspicious for malignancy. Images were processed with CAD. IMPRESSION: No mammographic evidence of malignancy. A result letter of this screening mammogram will be mailed directly to the patient. RECOMMENDATION: Screening mammogram in one year. (Code:SM-B-01Y) BI-RADS CATEGORY  1: Negative. Electronically Signed   By: Margarette Canada M.D.   On: 04/22/2018 15:16    Assessment/Plan 1. Atherosclerosis of native artery of both lower extremities with intermittent claudication (HCC)  Recommend:  The patient has evidence of atherosclerosis of the lower extremities with claudication.  The patient does not voice lifestyle limiting changes at this point in time.  Noninvasive studies do not suggest clinically significant change.  No invasive studies, angiography or surgery at this time The patient should continue walking and begin a more formal exercise program.  The patient should continue antiplatelet therapy and aggressive treatment of the lipid abnormalities  No changes in the patient's medications at this time  The patient should continue wearing graduated compression socks 10-15 mmHg strength to control the mild edema.    2. Bilateral carotid artery stenosis The  patient remains asymptomatic with respect to the carotid stenosis.  However, the patient has now progressed and has a lesion the is >70%.  Patient should undergo CT angiography of the carotid arteries to define the degree of stenosis of the internal carotid arteries bilaterally and the anatomic suitability for surgery vs. intervention.  If the patient does indeed need surgery cardiac clearance will be required, once cleared the patient will be scheduled for surgery.  The risks, benefits and alternative therapies were reviewed in detail with the patient.  All questions were answered.  The patient agrees to proceed with imaging.  Continue antiplatelet therapy as prescribed. Continue management of CAD, HTN and Hyperlipidemia. Healthy heart diet, encouraged exercise at least 4 times per week.   - CT ANGIO NECK W OR WO CONTRAST; Future  3. Chronic venous insufficiency No surgery or intervention at this point in time.    I have had a long discussion with the patient regarding venous insufficiency and why it  causes symptoms. I have discussed with the patient the chronic skin changes that accompany venous insufficiency and the long term sequela such as infection and ulceration.  Patient will begin wearing graduated compression stockings class 1 (20-30 mmHg) or compression wraps on a daily basis a prescription was given. The patient will put the stockings on first thing in the morning and removing them in the evening. The patient is instructed specifically not to sleep in the stockings.    In addition, behavioral modification including several periods of elevation of the lower extremities during the day will be continued. I have demonstrated that proper elevation is a position with the ankles at heart level.  The patient is instructed to begin routine exercise, especially walking on a daily basis   4. Essential hypertension Continue antihypertensive medications as already ordered, these medications  have been reviewed and there are no changes at this time.   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  Continued activity and therapy was stressed.   Hortencia Pilar, MD  04/29/2018 2:36 PM

## 2018-11-27 ENCOUNTER — Ambulatory Visit (INDEPENDENT_AMBULATORY_CARE_PROVIDER_SITE_OTHER): Payer: Medicare HMO | Admitting: Physician Assistant

## 2018-11-27 ENCOUNTER — Encounter: Payer: Self-pay | Admitting: Physician Assistant

## 2018-11-27 ENCOUNTER — Other Ambulatory Visit: Payer: Self-pay

## 2018-11-27 VITALS — BP 140/66 | HR 79 | Temp 98.2°F | Resp 16 | Wt 147.4 lb

## 2018-11-27 DIAGNOSIS — L97929 Non-pressure chronic ulcer of unspecified part of left lower leg with unspecified severity: Secondary | ICD-10-CM | POA: Diagnosis not present

## 2018-11-27 DIAGNOSIS — I70213 Atherosclerosis of native arteries of extremities with intermittent claudication, bilateral legs: Secondary | ICD-10-CM | POA: Diagnosis not present

## 2018-11-27 DIAGNOSIS — I10 Essential (primary) hypertension: Secondary | ICD-10-CM

## 2018-11-27 DIAGNOSIS — E7800 Pure hypercholesterolemia, unspecified: Secondary | ICD-10-CM

## 2018-11-27 DIAGNOSIS — F418 Other specified anxiety disorders: Secondary | ICD-10-CM | POA: Diagnosis not present

## 2018-11-27 MED ORDER — AMLODIPINE BESYLATE 10 MG PO TABS: 10.0000 mg | ORAL_TABLET | Freq: Every day | ORAL | 3 refills | Status: DC

## 2018-11-27 MED ORDER — ATORVASTATIN CALCIUM 10 MG PO TABS: 10.0000 mg | ORAL_TABLET | Freq: Every evening | ORAL | 3 refills | Status: DC

## 2018-11-27 MED ORDER — SERTRALINE HCL 50 MG PO TABS: 50.0000 mg | ORAL_TABLET | Freq: Every day | ORAL | 3 refills | Status: DC

## 2018-11-27 MED ORDER — CLOPIDOGREL BISULFATE 75 MG PO TABS
75.0000 mg | ORAL_TABLET | Freq: Every day | ORAL | 3 refills | Status: DC
Start: 2018-11-27 — End: 2019-12-17

## 2018-11-27 NOTE — Patient Instructions (Signed)
Peripheral Vascular Disease  Peripheral vascular disease (PVD) is a disease of the blood vessels that are not part of your heart and brain. A simple term for PVD is poor circulation. In most cases, PVD narrows the blood vessels that carry blood from your heart to the rest of your body. This can reduce the supply of blood to your arms, legs, and internal organs, like your stomach or kidneys. However, PVD most often affects a person's lower legs and feet. Without treatment, PVD tends to get worse. PVD can also lead to acute ischemic limb. This is when an arm or leg suddenly cannot get enough blood. This is a medical emergency. Follow these instructions at home: Lifestyle  Do not use any products that contain nicotine or tobacco, such as cigarettes and e-cigarettes. If you need help quitting, ask your doctor.  Lose weight if you are overweight. Or, stay at a healthy weight as told by your doctor.  Eat a diet that is low in fat and cholesterol. If you need help, ask your doctor.  Exercise regularly. Ask your doctor for activities that are right for you. General instructions  Take over-the-counter and prescription medicines only as told by your doctor.  Take good care of your feet: ? Wear comfortable shoes that fit well. ? Check your feet often for any cuts or sores.  Keep all follow-up visits as told by your doctor This is important. Contact a doctor if:  You have cramps in your legs when you walk.  You have leg pain when you are at rest.  You have coldness in a leg or foot.  Your skin changes.  You are unable to get or have an erection (erectile dysfunction).  You have cuts or sores on your feet that do not heal. Get help right away if:  Your arm or leg turns cold, numb, and blue.  Your arms or legs become red, warm, swollen, painful, or numb.  You have chest pain.  You have trouble breathing.  You suddenly have weakness in your face, arm, or leg.  You become very  confused or you cannot speak.  You suddenly have a very bad headache.  You suddenly cannot see. Summary  Peripheral vascular disease (PVD) is a disease of the blood vessels.  A simple term for PVD is poor circulation. Without treatment, PVD tends to get worse.  Treatment may include exercise, low fat and low cholesterol diet, and quitting smoking. This information is not intended to replace advice given to you by your health care provider. Make sure you discuss any questions you have with your health care provider. Document Released: 08/09/2009 Document Revised: 04/27/2017 Document Reviewed: 06/22/2016 Elsevier Patient Education  2020 Elsevier Inc.  

## 2018-11-27 NOTE — Progress Notes (Signed)
Patient: Tracey Rogers Female    DOB: 10-07-1945   73 y.o.   MRN: [id number] Visit Date: 11/27/2018  Today's Provider: Mar Daring, PA-C   Chief Complaint  Patient presents with   Leg Swelling   Subjective:    I,Joseline E. Rosas,RMA am acting as a Education administrator for [employer], PA-C.  HPI  Patient here today with c/o left leg swelling. Reports that she had a little discharge from an old wound in back of her achilles. This has been an ongoing ulcer of the left lower extremity that has not healed despite having bilateral SFA stents and having a normal duplex US with normal ABIs in December 2019. No new wounds, just chronic ulcer. Denies rest pain or claudication symptoms.   She does also have carotid stenosis bilaterally and there were plans to possibly have intervention on the right but this was postponed due to covid. Patient still denies any amaurosis fugax or TIA symptoms.   Her daughter is with her today and reports that her mother still smokes and does not wish to quit. She also reports that her mother has been more irritable lately. She thinks she is stressed and worried because her father (patient's husband) has been declining in health and is on dialysis. They are wanting to increase her sertraline.  She is also needing refills on her medications and labs checked today.  Allergies  Allergen Reactions   Clarithromycin Rash    "Mycin"   Methocarbamol Rash   Minocin [Minocycline Hcl] Rash   Other Rash    Optifoam applied to her left achilles wound.    Tetracycline Rash     Current Outpatient Medications:    acetaminophen (TYLENOL) 325 MG tablet, Take 325 mg by mouth every 6 (six) hours as needed for moderate pain or headache. , Disp: , Rfl:    alendronate (FOSAMAX) 70 MG tablet, TAKE ONE TABLET EVERY WEEK AT LEAST 30 MINUTES BEFORE FOOD OR BEVERAGE WITH A LITTLE WATER, Disp: 12 tablet, Rfl: 3   amLODipine (NORVASC) 10 MG tablet, TAKE 1 TABLET  BY MOUTH DAILY, Disp: 90 tablet, Rfl: 3   aspirin EC 81 MG tablet, Take 81 mg by mouth every evening., Disp: , Rfl:    atorvastatin (LIPITOR) 10 MG tablet, Take 1 tablet (10 mg total) by mouth every evening., Disp: 90 tablet, Rfl: 3   clopidogrel (PLAVIX) 75 MG tablet, TAKE 1 TABLET BY MOUTH DAILY, Disp: 90 tablet, Rfl: 3   fluticasone (FLONASE) 50 MCG/ACT nasal spray, Place 2 sprays into both nostrils daily. (Patient taking differently: Place 2 sprays into both nostrils daily as needed for allergies. ), Disp: 16 g, Rfl: 2   Magnesium 400 MG CAPS, Take 400 mg by mouth daily as needed (cramps). , Disp: , Rfl:    sertraline (ZOLOFT) 25 MG tablet, Take 1 tablet (25 mg total) by mouth at bedtime., Disp: 90 tablet, Rfl: 1   silver sulfADIAZINE (SILVADENE) 1 % cream, APPLY TO LEFT HEEL DAILY AND COVER WITH LIGHT GAUZE., Disp: 50 g, Rfl: 2   triamcinolone ointment (KENALOG) 0.1 %, Apply 1 application topically 2 (two) times daily as needed., Disp: , Rfl:   Review of Systems  Constitutional: Negative for fever.  Respiratory: Negative for chest tightness, shortness of breath and wheezing.   Cardiovascular: Negative for chest pain.  Skin: Positive for wound. Negative for color change and pallor.  Neurological: Negative for headaches.    Social History   Tobacco  Use   Smoking status: Former Smoker    Packs/day: 0.50    Years: 42.00    Pack years: 21.00    Types: Cigarettes    Quit date: 12/24/2013    Years since quitting: 4.9   Smokeless tobacco: Never Used   Tobacco comment: no smokers in her home  Substance Use Topics   Alcohol use: No    Alcohol/week: 0.0 standard drinks      Objective:   BP 140/66 (BP Location: Left Arm, Patient Position: Sitting, Cuff Size: Normal)    Pulse 79    Temp 98.2 F (36.8 C) (Oral)    Resp 16    Wt 147 lb 6.4 oz (66.9 kg)    BMI 25.30 kg/m  Vitals:   11/27/18 1016  BP: 140/66  Pulse: 79  Resp: 16  Temp: 98.2 F (36.8 C)  TempSrc: Oral    Weight: 147 lb 6.4 oz (66.9 kg)     Physical Exam Vitals signs reviewed.  Constitutional:      General: She is not in acute distress.    Appearance: Normal appearance. She is well-developed. She is not ill-appearing or diaphoretic.  Neck:     Musculoskeletal: Normal range of motion and neck supple.  Cardiovascular:     Rate and Rhythm: Normal rate and regular rhythm.     Pulses:          Dorsalis pedis pulses are 1+ on the right side and 1+ on the left side.       Posterior tibial pulses are 1+ on the right side and 0 on the left side.     Heart sounds: Normal heart sounds. No murmur. No friction rub. No gallop.   Pulmonary:     Effort: Pulmonary effort is normal. No respiratory distress.     Breath sounds: Normal breath sounds. No wheezing or rales.  Musculoskeletal:     Right foot: Normal range of motion. Deformity (surgical shortening of the great toe from removal of a bone; well healed) present. No bunion.     Left foot: Normal range of motion. No deformity.  Feet:     Right foot:     Skin integrity: Skin integrity normal. No ulcer, erythema or warmth.     Toenail Condition: Right toenails are normal.     Left foot:     Skin integrity: Ulcer (left achilles; see photo) present.     Toenail Condition: Left toenails are normal.     Comments: Ulcer is covered with dry eschar, not wet. There was some slight serosanguinous drainage on the bandage. There is no pain. There was a slight necrotic smell when I removed the bandage. No surrounding redness, no warmth, no purulent drainage, no induration or fluctuation.  Feet are warm bilaterally and cap refill is approx 2-3 seconds Neurological:     Mental Status: She is alert.        No results found for any visits on 11/27/18.     Assessment & Plan    1. Benign essential HTN Stable. Diagnosis pulled for medication refill. Continue current medical treatment plan. Will check labs as below and f/u pending results. - CBC  w/Diff/Platelet - Comprehensive Metabolic Panel (CMET) - Lipid Profile - HgB A1c - amLODipine (NORVASC) 10 MG tablet; Take 1 tablet (10 mg total) by mouth daily.  Dispense: 90 tablet; Refill: 3  2. Hypercholesterolemia Stable. Diagnosis pulled for medication refill. Continue current medical treatment plan. Will check labs as below and f/u pending  results. - CBC w/Diff/Platelet - Comprehensive Metabolic Panel (CMET) - Lipid Profile - HgB A1c - atorvastatin (LIPITOR) 10 MG tablet; Take 1 tablet (10 mg total) by mouth every evening.  Dispense: 90 tablet; Refill: 3  3. Atherosclerosis of native artery of both lower extremities with intermittent claudication (HCC) Stable. Diagnosis pulled for medication refill. Continue current medical treatment plan. Will check labs as below and f/u pending results. Followed by Vascular surgery, Dr. Delana Meyer.  - CBC w/Diff/Platelet - Comprehensive Metabolic Panel (CMET) - Lipid Profile - HgB A1c - clopidogrel (PLAVIX) 75 MG tablet; Take 1 tablet (75 mg total) by mouth daily.  Dispense: 90 tablet; Refill: 3  4. Situational anxiety Worsening. Increase dose to 50mg  as below.  - sertraline (ZOLOFT) 50 MG tablet; Take 1 tablet (50 mg total) by mouth daily.  Dispense: 90 tablet; Refill: 3  5. Ulcer of left lower extremity, unspecified ulcer stage (HCC) Chronic. Patient states no change. No signs of infection today. Feet were warm and cap refill normal. Patient has no pain. She is to f/u in December for her repeat imaging. I will send picture of the wound to Dr. Delana Meyer to see if this seems stable and if he is ok with the year timeline or if she should be seen sooner.  - CBC w/Diff/Platelet - Comprehensive Metabolic Panel (CMET) - Lipid Profile - HgB A1c     Mar Daring, PA-C  [facility]

## 2018-11-28 LAB — COMPREHENSIVE METABOLIC PANEL
ALT: 12 IU/L (ref 0–32)
AST: 16 IU/L (ref 0–40)
Albumin/Globulin Ratio: 1.3 (ref 1.2–2.2)
Albumin: 4.5 g/dL (ref 3.7–4.7)
Alkaline Phosphatase: 65 IU/L (ref 39–117)
BUN/Creatinine Ratio: 15 (ref 12–28)
BUN: 13 mg/dL (ref 8–27)
Bilirubin Total: 0.6 mg/dL (ref 0.0–1.2)
CO2: 17 mmol/L — ABNORMAL LOW (ref 20–29)
Calcium: 9.6 mg/dL (ref 8.7–10.3)
Chloride: 103 mmol/L (ref 96–106)
Creatinine, Ser: 0.88 mg/dL (ref 0.57–1.00)
GFR calc Af Amer: 76 mL/min/{1.73_m2} (ref >59)
GFR calc non Af Amer: 66 mL/min/{1.73_m2} (ref >59)
Globulin, Total: 3.4 g/dL (ref 1.5–4.5)
Glucose: 100 mg/dL — ABNORMAL HIGH (ref 65–99)
Potassium: 5.1 mmol/L (ref 3.5–5.2)
Sodium: 137 mmol/L (ref 134–144)
Total Protein: 7.9 g/dL (ref 6.0–8.5)

## 2018-11-28 LAB — CBC WITH DIFFERENTIAL/PLATELET
Basophils Absolute: 0.2 10*3/uL (ref 0.0–0.2)
Basos: 2 %
EOS (ABSOLUTE): 0.4 10*3/uL (ref 0.0–0.4)
Eos: 3 %
Hematocrit: 41 % (ref 34.0–46.6)
Hemoglobin: 13.7 g/dL (ref 11.1–15.9)
Immature Grans (Abs): 0 10*3/uL (ref 0.0–0.1)
Immature Granulocytes: 0 %
Lymphocytes Absolute: 1.7 10*3/uL (ref 0.7–3.1)
Lymphs: 14 %
MCH: 29.3 pg (ref 26.6–33.0)
MCHC: 33.4 g/dL (ref 31.5–35.7)
MCV: 88 fL (ref 79–97)
Monocytes Absolute: 0.7 10*3/uL (ref 0.1–0.9)
Monocytes: 6 %
Neutrophils Absolute: 8.6 10*3/uL — ABNORMAL HIGH (ref 1.4–7.0)
Neutrophils: 75 %
Platelets: 288 10*3/uL (ref 150–450)
RBC: 4.67 x10E6/uL (ref 3.77–5.28)
RDW: 13.5 % (ref 11.7–15.4)
WBC: 11.6 10*3/uL — ABNORMAL HIGH (ref 3.4–10.8)

## 2018-11-28 LAB — HEMOGLOBIN A1C
Est. average glucose Bld gHb Est-mCnc: 105 mg/dL
Hgb A1c MFr Bld: 5.3 % (ref 4.8–5.6)

## 2018-11-28 LAB — LIPID PANEL
Chol/HDL Ratio: 3.2 ratio (ref 0.0–4.4)
Cholesterol, Total: 131 mg/dL (ref 100–199)
HDL: 41 mg/dL (ref >39)
LDL Calculated: 76 mg/dL (ref 0–99)
Triglycerides: 68 mg/dL (ref 0–149)
VLDL Cholesterol Cal: 14 mg/dL (ref 5–40)

## 2018-12-03 ENCOUNTER — Telehealth: Payer: Self-pay

## 2018-12-03 MED ORDER — CEPHALEXIN 500 MG PO CAPS: 500.0000 mg | ORAL_CAPSULE | Freq: Three times a day (TID) | ORAL | 0 refills | Status: DC

## 2018-12-03 NOTE — Telephone Encounter (Signed)
-----   Message from Mar Daring, [place] sent at 12/03/2018  5:11 PM EDT ----- Slight elevation of wbc count showing signs of infection. I will send in antibiotic. All other labs are normal.

## 2018-12-03 NOTE — Telephone Encounter (Signed)
LMTCB

## 2018-12-03 NOTE — Addendum Note (Signed)
Addended by: Mar Daring on: 12/03/2018 05:11 PM   Modules accepted: Orders

## 2018-12-04 NOTE — Telephone Encounter (Signed)
Patient advised as directed below. 

## 2019-01-09 ENCOUNTER — Other Ambulatory Visit (INDEPENDENT_AMBULATORY_CARE_PROVIDER_SITE_OTHER): Payer: Self-pay | Admitting: Vascular Surgery

## 2019-07-07 DIAGNOSIS — H903 Sensorineural hearing loss, bilateral: Secondary | ICD-10-CM | POA: Diagnosis not present

## 2019-07-07 DIAGNOSIS — H6123 Impacted cerumen, bilateral: Secondary | ICD-10-CM | POA: Diagnosis not present

## 2019-07-08 DIAGNOSIS — L989 Disorder of the skin and subcutaneous tissue, unspecified: Secondary | ICD-10-CM | POA: Diagnosis not present

## 2019-07-08 DIAGNOSIS — G609 Hereditary and idiopathic neuropathy, unspecified: Secondary | ICD-10-CM | POA: Diagnosis not present

## 2019-07-08 DIAGNOSIS — B351 Tinea unguium: Secondary | ICD-10-CM | POA: Diagnosis not present

## 2019-07-08 DIAGNOSIS — D2372 Other benign neoplasm of skin of left lower limb, including hip: Secondary | ICD-10-CM | POA: Diagnosis not present

## 2019-07-08 DIAGNOSIS — L97321 Non-pressure chronic ulcer of left ankle limited to breakdown of skin: Secondary | ICD-10-CM | POA: Diagnosis not present

## 2019-07-08 DIAGNOSIS — J449 Chronic obstructive pulmonary disease, unspecified: Secondary | ICD-10-CM | POA: Diagnosis not present

## 2019-07-08 DIAGNOSIS — L011 Impetiginization of other dermatoses: Secondary | ICD-10-CM | POA: Diagnosis not present

## 2019-07-22 DIAGNOSIS — G609 Hereditary and idiopathic neuropathy, unspecified: Secondary | ICD-10-CM | POA: Diagnosis not present

## 2019-07-22 DIAGNOSIS — L97321 Non-pressure chronic ulcer of left ankle limited to breakdown of skin: Secondary | ICD-10-CM | POA: Diagnosis not present

## 2019-07-27 DIAGNOSIS — R000 Tachycardia, unspecified: Secondary | ICD-10-CM | POA: Diagnosis not present

## 2019-07-27 DIAGNOSIS — J0140 Acute pansinusitis, unspecified: Secondary | ICD-10-CM | POA: Diagnosis not present

## 2019-07-27 DIAGNOSIS — R509 Fever, unspecified: Secondary | ICD-10-CM | POA: Diagnosis not present

## 2019-07-27 DIAGNOSIS — I10 Essential (primary) hypertension: Secondary | ICD-10-CM | POA: Diagnosis not present

## 2019-07-29 DIAGNOSIS — Z20828 Contact with and (suspected) exposure to other viral communicable diseases: Secondary | ICD-10-CM | POA: Diagnosis not present

## 2019-08-05 DIAGNOSIS — L97321 Non-pressure chronic ulcer of left ankle limited to breakdown of skin: Secondary | ICD-10-CM | POA: Diagnosis not present

## 2019-08-05 DIAGNOSIS — G609 Hereditary and idiopathic neuropathy, unspecified: Secondary | ICD-10-CM | POA: Diagnosis not present

## 2019-09-03 DIAGNOSIS — G609 Hereditary and idiopathic neuropathy, unspecified: Secondary | ICD-10-CM | POA: Diagnosis not present

## 2019-09-03 DIAGNOSIS — I739 Peripheral vascular disease, unspecified: Secondary | ICD-10-CM | POA: Diagnosis not present

## 2019-09-03 DIAGNOSIS — L905 Scar conditions and fibrosis of skin: Secondary | ICD-10-CM | POA: Diagnosis not present

## 2019-09-03 DIAGNOSIS — L97321 Non-pressure chronic ulcer of left ankle limited to breakdown of skin: Secondary | ICD-10-CM | POA: Diagnosis not present

## 2019-09-03 DIAGNOSIS — R239 Unspecified skin changes: Secondary | ICD-10-CM | POA: Diagnosis not present

## 2019-09-17 DIAGNOSIS — G609 Hereditary and idiopathic neuropathy, unspecified: Secondary | ICD-10-CM | POA: Diagnosis not present

## 2019-09-17 DIAGNOSIS — L97321 Non-pressure chronic ulcer of left ankle limited to breakdown of skin: Secondary | ICD-10-CM | POA: Diagnosis not present

## 2019-09-24 DIAGNOSIS — L97321 Non-pressure chronic ulcer of left ankle limited to breakdown of skin: Secondary | ICD-10-CM | POA: Diagnosis not present

## 2019-09-24 DIAGNOSIS — G609 Hereditary and idiopathic neuropathy, unspecified: Secondary | ICD-10-CM | POA: Diagnosis not present

## 2019-09-24 DIAGNOSIS — I739 Peripheral vascular disease, unspecified: Secondary | ICD-10-CM | POA: Diagnosis not present

## 2019-09-29 ENCOUNTER — Encounter (INDEPENDENT_AMBULATORY_CARE_PROVIDER_SITE_OTHER): Payer: Self-pay | Admitting: Vascular Surgery

## 2019-09-29 ENCOUNTER — Other Ambulatory Visit: Payer: Self-pay

## 2019-09-29 ENCOUNTER — Ambulatory Visit (INDEPENDENT_AMBULATORY_CARE_PROVIDER_SITE_OTHER): Payer: Medicare HMO | Admitting: Vascular Surgery

## 2019-09-29 VITALS — BP 162/72 | HR 87 | Resp 16 | Wt 148.4 lb

## 2019-09-29 DIAGNOSIS — L97321 Non-pressure chronic ulcer of left ankle limited to breakdown of skin: Secondary | ICD-10-CM | POA: Diagnosis not present

## 2019-09-29 DIAGNOSIS — I872 Venous insufficiency (chronic) (peripheral): Secondary | ICD-10-CM | POA: Diagnosis not present

## 2019-09-29 DIAGNOSIS — I6523 Occlusion and stenosis of bilateral carotid arteries: Secondary | ICD-10-CM | POA: Diagnosis not present

## 2019-09-29 DIAGNOSIS — I7025 Atherosclerosis of native arteries of other extremities with ulceration: Secondary | ICD-10-CM

## 2019-09-29 DIAGNOSIS — I10 Essential (primary) hypertension: Secondary | ICD-10-CM

## 2019-10-01 ENCOUNTER — Encounter (INDEPENDENT_AMBULATORY_CARE_PROVIDER_SITE_OTHER): Payer: Self-pay | Admitting: Vascular Surgery

## 2019-10-01 NOTE — Progress Notes (Signed)
MRN : [id number]  Tracey Rogers is a 74 y.o. (1946-04-25) female who presents with chief complaint of  Chief Complaint  Patient presents with  . Follow-up    re-establish care  .  History of Present Illness:  The patient is seen for evaluation of painful lower extremities and diminished pulses associated with ulceration of the left ankle.  The patient notes the ulcer recurred and has been present for multiple weeks and has not been improving.  It is very painful and has had some drainage associated with cellulitis.  No specific history of trauma noted by the patient.  The patient denies fever or chills.  The patient does not have diabetes.  There have been multiple past interventions.  Patient notes prior to the ulcer developing the extremities were painful particularly with ambulation or activity and the discomfort is very consistent day today. Typically, the pain occurs at less than one block, progress is as activity continues to the point that the patient must stop walking. Resting including standing still for several minutes allowed resumption of the activity and the ability to walk a similar distance before stopping again.   The patient denies rest pain or dangling of an extremity off the side of the bed during the night for relief.  No history of back problems or DJD of the lumbar sacral spine.   The patient denies amaurosis fugax or recent TIA symptoms. There are no recent neurological changes noted. The patient denies history of DVT, PE or superficial thrombophlebitis. The patient denies recent episodes of angina or shortness of breath.    Current Meds  Medication Sig  . acetaminophen (TYLENOL) 325 MG tablet Take 325 mg by mouth every 6 (six) hours as needed for moderate pain or headache.   Marland Kitchen amLODipine (NORVASC) 10 MG tablet Take 1 tablet (10 mg total) by mouth daily.  Marland Kitchen aspirin EC 81 MG tablet Take 81 mg by mouth every evening.  Marland Kitchen atorvastatin (LIPITOR) 10 MG tablet  Take 1 tablet (10 mg total) by mouth every evening.  . clopidogrel (PLAVIX) 75 MG tablet Take 1 tablet (75 mg total) by mouth daily.  . fluticasone (FLONASE) 50 MCG/ACT nasal spray Place 2 sprays into both nostrils daily. (Patient taking differently: Place 2 sprays into both nostrils daily as needed for allergies. )  . Magnesium 400 MG CAPS Take 400 mg by mouth daily as needed (cramps).   . silver sulfADIAZINE (SILVADENE) 1 % cream APPLY TO LEFT HEAL DAILY AND COVER WITH LIGHT GAUZE  . triamcinolone ointment (KENALOG) 0.1 % Apply 1 application topically 2 (two) times daily as needed.    Past Medical History:  Diagnosis Date  . Arthritis   . Complication of anesthesia    gets weak after surgery  . Constipation   . COPD (chronic obstructive pulmonary disease) (HCC)    mild  . Family history of adverse reaction to anesthesia    son gets postop nausea and vomiting, weak  . GERD (gastroesophageal reflux disease)   . Heart murmur   . Hypertension   . Peripheral vascular disease ([place])   . Psoriasis   . Psoriasis   . Skin ulcer of right great toe ([place])   . Ulcer of left ankle ([place])   . Wears dentures     Past Surgical History:  Procedure Laterality Date  . APPLICATION OF A-CELL OF EXTREMITY Left 04/13/2014   Procedure: PLACEMENT OF APPLICATION OF A-CELL ;  Surgeon: Theodoro Kos, DO;  Location: [facility];  Service: Plastics;  Laterality: Left;  . APPLICATION OF A-CELL OF EXTREMITY Left 09/10/2014   Procedure: APPLICATION OF A-CELL OF EXTREMITY;  Surgeon: Theodoro Kos, DO;  Location: [facility];  Service: Plastics;  Laterality: Left;  . CARDIOVASCULAR STRESS TEST  03-03-2014  dr Saralyn Pilar   normal lexi scan sestamibi study/  normal LVF without evidence for significant scar or ischemia  . CATARACT EXTRACTION W/ INTRAOCULAR LENS  IMPLANT, BILATERAL  2015  . CHOLECYSTECTOMY N/A 06/16/2015   Procedure: LAPAROSCOPIC CHOLECYSTECTOMY WITH CHOLANGIOGRAM;  Surgeon:  Robert Bellow, MD;  Location: [facility];  Service: General;  Laterality: N/A;  . DILATION AND CURETTAGE OF UTERUS    . ENDOVASCULAR STENT INSERTION  sept  &  oct  2015   LEFT LEG STENTING--  common and superficial femoral artery  . ENDOVASCULAR STENT INSERTION  May 27, 2014   LifeStent bare metal stent left SFA  . HERNIA REPAIR  10/27/2015   Ventral hernia repaired with Ventrio ST mesh above previous umbilical port site  . I & D EXTREMITY Left 09/10/2014   Procedure: IRRIGATION AND DEBRIDEMENT LEFT ANKLE WOUND SURGICAL PREP ;  Surgeon: Theodoro Kos, DO;  Location: [facility];  Service: Plastics;  Laterality: Left;  . INCISION AND DRAINAGE OF WOUND Bilateral 04/13/2014   Procedure: IRRIGATION AND DEBRIDEMENT OF LEFT ANKLE WOUND AND RIGHT FOOT;  Surgeon: Theodoro Kos, DO;  Location: [facility];  Service: Plastics;  Laterality: Bilateral;  . LOWER EXTREMITY ANGIOGRAPHY Left 06/26/2017   Procedure: LOWER EXTREMITY ANGIOGRAPHY;  Surgeon: Katha Cabal, MD;  Location: [facility];  Service: Cardiovascular;  Laterality: Left;  . TRANSTHORACIC ECHOCARDIOGRAM  03-03-2014   normal LVF/  ef 55-60%/  mild TI and MI  . TUBAL LIGATION    . VENTRAL HERNIA REPAIR N/A 10/27/2015   Procedure: HERNIA REPAIR VENTRAL ADULT;  Surgeon: Robert Bellow, MD;  Location: [facility];  Service: General;  Laterality: N/A;    Social History Social History   Tobacco Use  . Smoking status: Former Smoker    Packs/day: 0.50    Years: 42.00    Pack years: 21.00    Types: Cigarettes    Quit date: 12/24/2013    Years since quitting: 5.7  . Smokeless tobacco: Never Used  . Tobacco comment: no smokers in her home  Substance Use Topics  . Alcohol use: No    Alcohol/week: 0.0 standard drinks  . Drug use: No    Family History Family History  Problem Relation Age of Onset  . Kidney disease Mother   . Hypertension Mother   . Heart disease Mother   . Breast cancer  Maternal Aunt 60  . Heart disease Sister   . Stroke Sister     Allergies  Allergen Reactions  . Clarithromycin Rash    "Mycin"  . Methocarbamol Rash  . Minocin [Minocycline Hcl] Rash  . Other Rash    Optifoam applied to her left achilles wound.   . Tetracycline Rash  . Tetracyclines & Related Rash     REVIEW OF SYSTEMS (Negative unless checked)  Constitutional: [] Weight loss  [] Fever  [] Chills Cardiac: [] Chest pain   [] Chest pressure   [] Palpitations   [] Shortness of breath when laying flat   [] Shortness of breath with exertion. Vascular:  [x] Pain in legs with walking   [x] Pain in legs at rest  [] History of DVT   [] Phlebitis   [] Swelling in legs   [] Varicose  veins   [x] Non-healing ulcers Pulmonary:   [] Uses home oxygen   [] Productive cough   [] Hemoptysis   [] Wheeze  [] COPD   [] Asthma Neurologic:  [] Dizziness   [] Seizures   [] History of stroke   [] History of TIA  [] Aphasia   [] Vissual changes   [] Weakness or numbness in arm   [] Weakness or numbness in leg Musculoskeletal:   [] Joint swelling   [] Joint pain   [] Low back pain Hematologic:  [] Easy bruising  [] Easy bleeding   [] Hypercoagulable state   [] Anemic Gastrointestinal:  [] Diarrhea   [] Vomiting  [] Gastroesophageal reflux/heartburn   [] Difficulty swallowing. Genitourinary:  [] Chronic kidney disease   [] Difficult urination  [] Frequent urination   [] Blood in urine Skin:  [x] Rashes   [x] Ulcers  Psychological:  [] History of anxiety   []  History of major depression.  Physical Examination  Vitals:   09/29/19 1202  BP: (!) 162/72  Pulse: 87  Resp: 16  Weight: 148 lb 6.4 oz (67.3 kg)   Body mass index is 25.47 kg/m. Gen: WD/WN, NAD Head: [place]/AT, No temporalis wasting.  Ear/Nose/Throat: Hearing grossly intact, nares w/o erythema or drainage Eyes: PER, EOMI, sclera nonicteric.  Neck: Supple, no large masses.   Pulmonary:  Good air movement, no audible wheezing bilaterally, no use of accessory muscles.  Cardiac: RRR, no  JVD Vascular: Dry noninfected ulcer of the posterior ankle. Vessel Right Left  Radial Palpable Palpable  PT Not Palpable Not Palpable  DP Not Palpable 2+ Palpable  Gastrointestinal: Non-distended. No guarding/no peritoneal signs.  Musculoskeletal: M/S 5/5 throughout.  No deformity or atrophy.  Neurologic: CN 2-12 intact. Symmetrical.  Speech is fluent. Motor exam as listed above. Psychiatric: Judgment intact, Mood & affect appropriate for pt's clinical situation. Dermatologic: Venous rashes + ulcers noted.  No changes consistent with cellulitis.   CBC Lab Results  Component Value Date   WBC 11.6 (H) 11/27/2018   HGB 13.7 11/27/2018   HCT 41.0 11/27/2018   MCV 88 11/27/2018   PLT 288 11/27/2018    BMET    Component Value Date/Time   NA 137 11/27/2018 1134   NA 128 (L) 05/27/2014 1253   K 5.1 11/27/2018 1134   K 4.2 05/27/2014 1253   CL 103 11/27/2018 1134   CL 97 (L) 05/27/2014 1253   CO2 17 (L) 11/27/2018 1134   CO2 25 05/27/2014 1253   GLUCOSE 100 (H) 11/27/2018 1134   GLUCOSE 111 (H) 02/28/2017 1105   GLUCOSE 84 05/27/2014 1253   BUN 13 11/27/2018 1134   BUN 9 05/27/2014 1253   CREATININE 0.88 11/27/2018 1134   CREATININE 0.82 02/28/2017 1105   CALCIUM 9.6 11/27/2018 1134   CALCIUM 9.1 05/27/2014 1253   GFRNONAA 66 11/27/2018 1134   GFRNONAA 72 02/28/2017 1105   GFRAA 76 11/27/2018 1134   GFRAA 84 02/28/2017 1105   CrCl cannot be calculated (Patient's most recent lab result is older than the maximum 21 days allowed.).  COAG Lab Results  Component Value Date   INR 0.9 03/18/2014    Radiology No results found.   Assessment/Plan 1. Atherosclerosis of native arteries of the extremities with ulceration ([place]) Recommend:  Patient should undergo arterial duplex of the lower extremity ASAP because there has been a significant deterioration in the patient's lower extremity symptoms.  The patient states they are having increased pain and a marked decrease in the  distance that they can walk.  The risks and benefits as well as the alternatives were discussed in detail with the patient.  All questions were answered.  Patient agrees to proceed and understands this could be a prelude to angiography and intervention.  The patient will follow up with me in the office to review the studies.   - VAS [place] LOWER EXTREMITY ARTERIAL DUPLEX; Future - VAS [place] ABI WITH/WO TBI; Future  2. Chronic venous insufficiency No surgery or intervention at this point in time.    I have had a long discussion with the patient regarding venous insufficiency and why it  causes symptoms. I have discussed with the patient the chronic skin changes that accompany venous insufficiency and the long term sequela such as infection and ulceration.  Patient will begin wearing graduated compression stockings class 1 (20-30 mmHg) or compression wraps on a daily basis a prescription was given. The patient will put the stockings on first thing in the morning and removing them in the evening. The patient is instructed specifically not to sleep in the stockings.    In addition, behavioral modification including several periods of elevation of the lower extremities during the day will be continued. I have demonstrated that proper elevation is a position with the ankles at heart level.  The patient is instructed to begin routine exercise, especially walking on a daily basis  Following the review of the ultrasound the patient will follow up in 2-3 months to reassess the degree of swelling and the control that graduated compression stockings or compression wraps  is offering.   The patient can be assessed for a Lymph Pump at that time  3. Bilateral carotid artery stenosis Recommend:  Given the patient's asymptomatic subcritical stenosis no further invasive testing or surgery at this time.  Previous duplex ultrasound shows <70% stenosis bilaterally.  Continue antiplatelet therapy as  prescribed Continue management of CAD, HTN and Hyperlipidemia Healthy heart diet,  encouraged exercise at least 4 times per week Follow up in 6 months with duplex ultrasound and physical exam   4. Essential hypertension Continue antihypertensive medications as already ordered, these medications have been reviewed and there are no changes at this time.    Hortencia Pilar, MD  10/01/2019 7:35 AM

## 2019-10-08 DIAGNOSIS — L97321 Non-pressure chronic ulcer of left ankle limited to breakdown of skin: Secondary | ICD-10-CM | POA: Diagnosis not present

## 2019-10-20 ENCOUNTER — Ambulatory Visit (INDEPENDENT_AMBULATORY_CARE_PROVIDER_SITE_OTHER): Payer: Medicare HMO | Admitting: Vascular Surgery

## 2019-10-20 ENCOUNTER — Encounter (INDEPENDENT_AMBULATORY_CARE_PROVIDER_SITE_OTHER): Payer: Self-pay | Admitting: Vascular Surgery

## 2019-10-20 ENCOUNTER — Ambulatory Visit (INDEPENDENT_AMBULATORY_CARE_PROVIDER_SITE_OTHER): Payer: Medicare HMO

## 2019-10-20 ENCOUNTER — Other Ambulatory Visit: Payer: Self-pay

## 2019-10-20 VITALS — BP 153/79 | HR 72

## 2019-10-20 DIAGNOSIS — I6523 Occlusion and stenosis of bilateral carotid arteries: Secondary | ICD-10-CM | POA: Diagnosis not present

## 2019-10-20 DIAGNOSIS — L8990 Pressure ulcer of unspecified site, unspecified stage: Secondary | ICD-10-CM | POA: Insufficient documentation

## 2019-10-20 DIAGNOSIS — I7025 Atherosclerosis of native arteries of other extremities with ulceration: Secondary | ICD-10-CM | POA: Diagnosis not present

## 2019-10-20 DIAGNOSIS — I10 Essential (primary) hypertension: Secondary | ICD-10-CM

## 2019-10-20 DIAGNOSIS — I872 Venous insufficiency (chronic) (peripheral): Secondary | ICD-10-CM

## 2019-10-20 NOTE — Progress Notes (Signed)
MRN : [id number]  Tracey Rogers is a 74 y.o. (04-29-1946) female who presents with chief complaint of  Chief Complaint  Patient presents with   Follow-up    U/S Follow up  .  History of Present Illness:   The patient returns to the office for followup and review of the noninvasive studies. There have been no interval changes in lower extremity symptoms. No interval shortening of the patient's claudication distance or development of rest pain symptoms. No new ulcers or wounds have occurred since the last visit.  There have been no significant changes to the patient's overall health care.  The patient denies amaurosis fugax or recent TIA symptoms. There are no recent neurological changes noted. The patient denies history of DVT, PE or superficial thrombophlebitis. The patient denies recent episodes of angina or shortness of breath.   ABI Rt=0.81 and Lt=0.92  (previous ABI's Rt=1.17 and Lt=1.12) Duplex ultrasound of the left leg shows the SFA stent is patent with a moderate stenosis with triphasic signals at the ankle  Current Meds  Medication Sig   acetaminophen (TYLENOL) 325 MG tablet Take 325 mg by mouth every 6 (six) hours as needed for moderate pain or headache.    amLODipine (NORVASC) 10 MG tablet Take 1 tablet (10 mg total) by mouth daily.   aspirin EC 81 MG tablet Take 81 mg by mouth every evening.   atorvastatin (LIPITOR) 10 MG tablet Take 1 tablet (10 mg total) by mouth every evening.   cephALEXin (KEFLEX) 500 MG capsule Take 1 capsule (500 mg total) by mouth 3 (three) times daily.   clopidogrel (PLAVIX) 75 MG tablet Take 1 tablet (75 mg total) by mouth daily.   fluticasone (FLONASE) 50 MCG/ACT nasal spray Place 2 sprays into both nostrils daily. (Patient taking differently: Place 2 sprays into both nostrils daily as needed for allergies. )   Magnesium 400 MG CAPS Take 400 mg by mouth daily as needed (cramps).    sertraline (ZOLOFT) 50 MG tablet Take 1  tablet (50 mg total) by mouth daily.   silver sulfADIAZINE (SILVADENE) 1 % cream APPLY TO LEFT HEAL DAILY AND COVER WITH LIGHT GAUZE   triamcinolone ointment (KENALOG) 0.1 % Apply 1 application topically 2 (two) times daily as needed.    Past Medical History:  Diagnosis Date   Arthritis    Complication of anesthesia    gets weak after surgery   Constipation    COPD (chronic obstructive pulmonary disease) (HCC)    mild   Family history of adverse reaction to anesthesia    son gets postop nausea and vomiting, weak   GERD (gastroesophageal reflux disease)    Heart murmur    Hypertension    Peripheral vascular disease ([place])    Psoriasis    Psoriasis    Skin ulcer of right great toe ([place])    Ulcer of left ankle ([place])    Wears dentures     Past Surgical History:  Procedure Laterality Date   APPLICATION OF A-CELL OF EXTREMITY Left 04/13/2014   Procedure: PLACEMENT OF APPLICATION OF A-CELL ;  Surgeon: Theodoro Kos, DO;  Location: [facility];  Service: Plastics;  Laterality: Left;   APPLICATION OF A-CELL OF EXTREMITY Left 09/10/2014   Procedure: APPLICATION OF A-CELL OF EXTREMITY;  Surgeon: Theodoro Kos, DO;  Location: [facility];  Service: Plastics;  Laterality: Left;   CARDIOVASCULAR STRESS TEST  03-03-2014  dr Saralyn Pilar   normal lexi scan sestamibi study/  normal LVF without evidence for significant scar or ischemia   CATARACT EXTRACTION W/ INTRAOCULAR LENS  IMPLANT, BILATERAL  2015   CHOLECYSTECTOMY N/A 06/16/2015   Procedure: LAPAROSCOPIC CHOLECYSTECTOMY WITH CHOLANGIOGRAM;  Surgeon: Robert Bellow, MD;  Location: [facility];  Service: General;  Laterality: N/A;   DILATION AND CURETTAGE OF UTERUS     ENDOVASCULAR STENT INSERTION  sept  &  oct  2015   LEFT LEG STENTING--  common and superficial femoral artery   ENDOVASCULAR STENT INSERTION  May 27, 2014   LifeStent bare metal stent left SFA   HERNIA REPAIR  10/27/2015    Ventral hernia repaired with Ventrio ST mesh above previous umbilical port site   I & D EXTREMITY Left 09/10/2014   Procedure: IRRIGATION AND DEBRIDEMENT LEFT ANKLE WOUND SURGICAL PREP ;  Surgeon: Theodoro Kos, DO;  Location: [facility];  Service: Plastics;  Laterality: Left;   INCISION AND DRAINAGE OF WOUND Bilateral 04/13/2014   Procedure: IRRIGATION AND DEBRIDEMENT OF LEFT ANKLE WOUND AND RIGHT FOOT;  Surgeon: Theodoro Kos, DO;  Location: [facility];  Service: Plastics;  Laterality: Bilateral;   LOWER EXTREMITY ANGIOGRAPHY Left 06/26/2017   Procedure: LOWER EXTREMITY ANGIOGRAPHY;  Surgeon: Katha Cabal, MD;  Location: [facility];  Service: Cardiovascular;  Laterality: Left;   TRANSTHORACIC ECHOCARDIOGRAM  03-03-2014   normal LVF/  ef 55-60%/  mild TI and MI   TUBAL LIGATION     VENTRAL HERNIA REPAIR N/A 10/27/2015   Procedure: HERNIA REPAIR VENTRAL ADULT;  Surgeon: Robert Bellow, MD;  Location: [facility];  Service: General;  Laterality: N/A;    Social History Social History   Tobacco Use   Smoking status: Former Smoker    Packs/day: 0.50    Years: 42.00    Pack years: 21.00    Types: Cigarettes    Quit date: 12/24/2013    Years since quitting: 5.8   Smokeless tobacco: Never Used   Tobacco comment: no smokers in her home  Substance Use Topics   Alcohol use: No    Alcohol/week: 0.0 standard drinks   Drug use: No    Family History Family History  Problem Relation Age of Onset   Kidney disease Mother    Hypertension Mother    Heart disease Mother    Breast cancer Maternal Aunt 31   Heart disease Sister    Stroke Sister     Allergies  Allergen Reactions   Clarithromycin Rash    "Mycin"   Methocarbamol Rash   Minocin [Minocycline Hcl] Rash   Other Rash    Optifoam applied to her left achilles wound.    Tetracycline Rash   Tetracyclines & Related Rash     REVIEW OF SYSTEMS (Negative unless  checked)  Constitutional: [] Weight loss  [] Fever  [] Chills Cardiac: [] Chest pain   [] Chest pressure   [] Palpitations   [] Shortness of breath when laying flat   [] Shortness of breath with exertion. Vascular:  [] Pain in legs with walking   [] Pain in legs at rest  [] History of DVT   [] Phlebitis   [] Swelling in legs   [] Varicose veins   [x] Non-healing ulcers Pulmonary:   [] Uses home oxygen   [] Productive cough   [] Hemoptysis   [] Wheeze  [] COPD   [] Asthma Neurologic:  [] Dizziness   [] Seizures   [] History of stroke   [] History of TIA  [] Aphasia   [] Vissual changes   [] Weakness or numbness in arm   [] Weakness or numbness in  leg Musculoskeletal:   [] Joint swelling   [] Joint pain   [] Low back pain Hematologic:  [] Easy bruising  [] Easy bleeding   [] Hypercoagulable state   [] Anemic Gastrointestinal:  [] Diarrhea   [] Vomiting  [] Gastroesophageal reflux/heartburn   [] Difficulty swallowing. Genitourinary:  [] Chronic kidney disease   [] Difficult urination  [] Frequent urination   [] Blood in urine Skin:  [] Rashes   [x] Ulcers  Psychological:  [] History of anxiety   []  History of major depression.  Physical Examination  Vitals:   10/20/19 1555  BP: (!) 153/79  Pulse: 72   There is no height or weight on file to calculate BMI. Gen: WD/WN, NAD Head: [place]/AT, No temporalis wasting.  Ear/Nose/Throat: Hearing grossly intact, nares w/o erythema or drainage Eyes: PER, EOMI, sclera nonicteric.  Neck: Supple, no large masses.   Pulmonary:  Good air movement, no audible wheezing bilaterally, no use of accessory muscles.  Cardiac: RRR, no JVD Vascular: left heel ulcer not infected Vessel Right Left  Radial Palpable Palpable  PT Not Palpable Trace Palpable  DP Not Palpable 2+ Palpable  Gastrointestinal: Non-distended. No guarding/no peritoneal signs.  Musculoskeletal: M/S 5/5 throughout.  No deformity or atrophy.  Neurologic: CN 2-12 intact. Symmetrical.  Speech is fluent. Motor exam as listed above. Psychiatric:  Judgment intact, Mood & affect appropriate for pt's clinical situation. Dermatologic: No rashes or ulcers noted.  No changes consistent with cellulitis. Lymph : No lichenification or skin changes of chronic lymphedema.  CBC Lab Results  Component Value Date   WBC 11.6 (H) 11/27/2018   HGB 13.7 11/27/2018   HCT 41.0 11/27/2018   MCV 88 11/27/2018   PLT 288 11/27/2018    BMET    Component Value Date/Time   NA 137 11/27/2018 1134   NA 128 (L) 05/27/2014 1253   K 5.1 11/27/2018 1134   K 4.2 05/27/2014 1253   CL 103 11/27/2018 1134   CL 97 (L) 05/27/2014 1253   CO2 17 (L) 11/27/2018 1134   CO2 25 05/27/2014 1253   GLUCOSE 100 (H) 11/27/2018 1134   GLUCOSE 111 (H) 02/28/2017 1105   GLUCOSE 84 05/27/2014 1253   BUN 13 11/27/2018 1134   BUN 9 05/27/2014 1253   CREATININE 0.88 11/27/2018 1134   CREATININE 0.82 02/28/2017 1105   CALCIUM 9.6 11/27/2018 1134   CALCIUM 9.1 05/27/2014 1253   GFRNONAA 66 11/27/2018 1134   GFRNONAA 72 02/28/2017 1105   GFRAA 76 11/27/2018 1134   GFRAA 84 02/28/2017 1105   CrCl cannot be calculated (Patient's most recent lab result is older than the maximum 21 days allowed.).  COAG Lab Results  Component Value Date   INR 0.9 03/18/2014    Radiology No results found.    Assessment/Plan 1. Atherosclerosis of native arteries of the extremities with ulceration ([place])  Recommend:  The patient has evidence of atherosclerosis of the lower extremities with claudication.  The patient does not voice lifestyle limiting changes at this point in time.    Noninvasive studies do not suggest clinically significant change. She appears to have good perfusion for wound healing.  No invasive studies, angiography or surgery at this time The patient should continue walking and begin a more formal exercise program.  The patient should continue antiplatelet therapy and aggressive treatment of the lipid abnormalities  No changes in the patient's medications at  this time  The patient should continue wearing graduated compression socks 10-15 mmHg strength to control the mild edema.   - VAS [place] LOWER EXTREMITY ARTERIAL DUPLEX; Future -  VAS [place] ABI WITH/WO TBI; Future  2. Bilateral carotid artery stenosis Recommend:  Given the patient's asymptomatic subcritical stenosis no further invasive testing or surgery at this time.  Continue antiplatelet therapy as prescribed Continue management of CAD, HTN and Hyperlipidemia Healthy heart diet,  encouraged exercise at least 4 times per week Follow up in 12 months with duplex ultrasound and physical exam   3. Chronic venous insufficiency No surgery or intervention at this point in time.    I have had a long discussion with the patient regarding venous insufficiency and why it  causes symptoms. I have discussed with the patient the chronic skin changes that accompany venous insufficiency and the long term sequela such as infection and ulceration.  Patient will begin wearing graduated compression stockings class 1 (20-30 mmHg) or compression wraps on a daily basis a prescription was given. The patient will put the stockings on first thing in the morning and removing them in the evening. The patient is instructed specifically not to sleep in the stockings.    In addition, behavioral modification including several periods of elevation of the lower extremities during the day will be continued. I have demonstrated that proper elevation is a position with the ankles at heart level.  The patient is instructed to begin routine exercise, especially walking on a daily basis  4. Essential hypertension Continue antihypertensive medications as already ordered, these medications have been reviewed and there are no changes at this time.     Hortencia Pilar, MD  10/20/2019 4:00 PM

## 2019-12-16 ENCOUNTER — Other Ambulatory Visit: Payer: Self-pay | Admitting: Physician Assistant

## 2019-12-16 DIAGNOSIS — Z1231 Encounter for screening mammogram for malignant neoplasm of breast: Secondary | ICD-10-CM

## 2019-12-17 ENCOUNTER — Other Ambulatory Visit: Payer: Self-pay | Admitting: Physician Assistant

## 2019-12-17 ENCOUNTER — Telehealth: Payer: Self-pay | Admitting: *Deleted

## 2019-12-17 DIAGNOSIS — I70213 Atherosclerosis of native arteries of extremities with intermittent claudication, bilateral legs: Secondary | ICD-10-CM

## 2019-12-17 DIAGNOSIS — E7800 Pure hypercholesterolemia, unspecified: Secondary | ICD-10-CM

## 2019-12-17 DIAGNOSIS — I10 Essential (primary) hypertension: Secondary | ICD-10-CM

## 2019-12-17 NOTE — Telephone Encounter (Signed)
Called pt to set up an appt for refills on her medications.   She was in the middle of doing something and said she would "call back and make the appt" which I offered to make an appt for her while on the phone. I let her know I was giving her 30 days worth of her medications so she would not run out before being seen.   She was agreeable to this.

## 2019-12-25 ENCOUNTER — Ambulatory Visit (INDEPENDENT_AMBULATORY_CARE_PROVIDER_SITE_OTHER): Payer: Medicare HMO | Admitting: Physician Assistant

## 2019-12-25 ENCOUNTER — Encounter: Payer: Self-pay | Admitting: Physician Assistant

## 2019-12-25 ENCOUNTER — Other Ambulatory Visit: Payer: Self-pay

## 2019-12-25 DIAGNOSIS — F17200 Nicotine dependence, unspecified, uncomplicated: Secondary | ICD-10-CM

## 2019-12-25 DIAGNOSIS — I7090 Unspecified atherosclerosis: Secondary | ICD-10-CM | POA: Diagnosis not present

## 2019-12-25 DIAGNOSIS — E7800 Pure hypercholesterolemia, unspecified: Secondary | ICD-10-CM

## 2019-12-25 DIAGNOSIS — I10 Essential (primary) hypertension: Secondary | ICD-10-CM

## 2019-12-25 DIAGNOSIS — F418 Other specified anxiety disorders: Secondary | ICD-10-CM | POA: Diagnosis not present

## 2019-12-25 MED ORDER — AMLODIPINE BESYLATE 10 MG PO TABS: 10.0000 mg | ORAL_TABLET | Freq: Every day | ORAL | 3 refills | Status: DC

## 2019-12-25 MED ORDER — SERTRALINE HCL 50 MG PO TABS: 50.0000 mg | ORAL_TABLET | Freq: Every day | ORAL | 3 refills | Status: DC

## 2019-12-25 MED ORDER — ATORVASTATIN CALCIUM 10 MG PO TABS: 10.0000 mg | ORAL_TABLET | Freq: Every evening | ORAL | 3 refills | Status: DC

## 2019-12-25 MED ORDER — CLOPIDOGREL BISULFATE 75 MG PO TABS: 75.0000 mg | ORAL_TABLET | Freq: Every day | ORAL | 3 refills | Status: DC

## 2019-12-30 ENCOUNTER — Encounter: Payer: Self-pay | Admitting: Physician Assistant

## 2019-12-30 NOTE — Progress Notes (Signed)
Virtual Visit via Video Note  I connected with Tracey Rogers on 12/30/19 at  1:40 PM EDT by a video enabled telemedicine application and verified that I am speaking with the correct person using two identifiers.  Interactive audio and video communications were attempted, although failed due to patient's inability to connect to video. Continued visit with audio only interaction with patient agreement.  Location: Patient: Home Provider: [facility]   I discussed the limitations of evaluation and management by telemedicine and the availability of in person appointments. The patient expressed understanding and agreed to proceed.  History of Present Illness: Tracey Rogers is a 74 yr old female that presents today for f/u on chronic health issues. She reports overall she feels ok.    Observations/Objective: NAD, able to talk in full sentences.  Assessment and Plan:  1. Essential hypertension Stable. Diagnosis pulled for medication refill. Continue current medical treatment plan. Will check labs as below and f/u pending results. - CBC w/Diff/Platelet - Comprehensive Metabolic Panel (CMET) - Lipid Panel With LDL/HDL Ratio - amLODipine (NORVASC) 10 MG tablet; Take 1 tablet (10 mg total) by mouth daily.  Dispense: 90 tablet; Refill: 3  2. Atherosclerosis Stable. Diagnosis pulled for medication refill. Continue current medical treatment plan. Will check labs as below and f/u pending results. - CBC w/Diff/Platelet - Comprehensive Metabolic Panel (CMET) - Lipid Panel With LDL/HDL Ratio - clopidogrel (PLAVIX) 75 MG tablet; Take 1 tablet (75 mg total) by mouth daily.  Dispense: 90 tablet; Refill: 3  3. Current every day smoker Candidate for lung cancer screening. Referral placed.  - Ambulatory Referral for Lung Cancer Scre  4. Hypercholesterolemia Stable. Diagnosis pulled for medication refill. Continue current medical treatment plan. Will check labs as below and f/u pending results. -  Lipid Panel With LDL/HDL Ratio - atorvastatin (LIPITOR) 10 MG tablet; Take 1 tablet (10 mg total) by mouth every evening.  Dispense: 90 tablet; Refill: 3  5. Situational anxiety Stable. Diagnosis pulled for medication refill. Continue current medical treatment plan. - sertraline (ZOLOFT) 50 MG tablet; Take 1 tablet (50 mg total) by mouth daily.  Dispense: 90 tablet; Refill: 3  Follow Up Instructions: F/U in 6 months.   I discussed the assessment and treatment plan with the patient. The patient was provided an opportunity to ask questions and all were answered. The patient agreed with the plan and demonstrated an understanding of the instructions.   The patient was advised to call back or seek an in-person evaluation if the symptoms worsen or if the condition fails to improve as anticipated.  I provided 13 minutes of non-face-to-face time during this encounter.   Mar Daring, PA-C

## 2019-12-31 DIAGNOSIS — G609 Hereditary and idiopathic neuropathy, unspecified: Secondary | ICD-10-CM | POA: Diagnosis not present

## 2019-12-31 DIAGNOSIS — L97321 Non-pressure chronic ulcer of left ankle limited to breakdown of skin: Secondary | ICD-10-CM | POA: Diagnosis not present

## 2019-12-31 DIAGNOSIS — B351 Tinea unguium: Secondary | ICD-10-CM | POA: Diagnosis not present

## 2020-01-01 ENCOUNTER — Telehealth: Payer: Self-pay | Admitting: *Deleted

## 2020-01-01 NOTE — Telephone Encounter (Signed)
Contacted regarding referral for lung screening. Patient reports that she has too much going on currently and will consider screening in September. She does not want to make that appointment at this time.

## 2020-01-07 ENCOUNTER — Other Ambulatory Visit: Payer: Self-pay

## 2020-01-07 ENCOUNTER — Ambulatory Visit
Admission: RE | Admit: 2020-01-07 | Discharge: 2020-01-07 | Disposition: A | Discharge status: Home / Self Care | Payer: Medicare HMO | Source: Ambulatory Visit | Attending: Physician Assistant | Admitting: Physician Assistant

## 2020-01-07 DIAGNOSIS — Z1231 Encounter for screening mammogram for malignant neoplasm of breast: Secondary | ICD-10-CM | POA: Diagnosis not present

## 2020-01-07 DIAGNOSIS — I7090 Unspecified atherosclerosis: Secondary | ICD-10-CM | POA: Diagnosis not present

## 2020-01-07 DIAGNOSIS — I10 Essential (primary) hypertension: Secondary | ICD-10-CM | POA: Diagnosis not present

## 2020-01-08 ENCOUNTER — Telehealth: Payer: Self-pay

## 2020-01-08 DIAGNOSIS — D72829 Elevated white blood cell count, unspecified: Secondary | ICD-10-CM

## 2020-01-08 LAB — COMPREHENSIVE METABOLIC PANEL
ALT: 17 IU/L (ref 0–32)
AST: 18 IU/L (ref 0–40)
Albumin/Globulin Ratio: 1.9 (ref 1.2–2.2)
Albumin: 4.6 g/dL (ref 3.7–4.7)
Alkaline Phosphatase: 65 IU/L (ref 48–121)
BUN/Creatinine Ratio: 10 — ABNORMAL LOW (ref 12–28)
BUN: 9 mg/dL (ref 8–27)
Bilirubin Total: 0.6 mg/dL (ref 0.0–1.2)
CO2: 20 mmol/L (ref 20–29)
Calcium: 9.4 mg/dL (ref 8.7–10.3)
Chloride: 100 mmol/L (ref 96–106)
Creatinine, Ser: 0.93 mg/dL (ref 0.57–1.00)
GFR calc Af Amer: 71 mL/min/{1.73_m2} (ref >59)
GFR calc non Af Amer: 61 mL/min/{1.73_m2} (ref >59)
Globulin, Total: 2.4 g/dL (ref 1.5–4.5)
Glucose: 78 mg/dL (ref 65–99)
Potassium: 4.4 mmol/L (ref 3.5–5.2)
Sodium: 135 mmol/L (ref 134–144)
Total Protein: 7 g/dL (ref 6.0–8.5)

## 2020-01-08 LAB — CBC WITH DIFFERENTIAL/PLATELET
Basophils Absolute: 0.2 10*3/uL (ref 0.0–0.2)
Basos: 1 %
EOS (ABSOLUTE): 0.3 10*3/uL (ref 0.0–0.4)
Eos: 2 %
Hematocrit: 38.4 % (ref 34.0–46.6)
Hemoglobin: 12.9 g/dL (ref 11.1–15.9)
Immature Grans (Abs): 0.1 10*3/uL (ref 0.0–0.1)
Immature Granulocytes: 1 %
Lymphocytes Absolute: 2 10*3/uL (ref 0.7–3.1)
Lymphs: 15 %
MCH: 31 pg (ref 26.6–33.0)
MCHC: 33.6 g/dL (ref 31.5–35.7)
MCV: 92 fL (ref 79–97)
Monocytes Absolute: 0.8 10*3/uL (ref 0.1–0.9)
Monocytes: 6 %
Neutrophils Absolute: 10.1 10*3/uL — ABNORMAL HIGH (ref 1.4–7.0)
Neutrophils: 75 %
Platelets: 251 10*3/uL (ref 150–450)
RBC: 4.16 x10E6/uL (ref 3.77–5.28)
RDW: 13 % (ref 11.7–15.4)
WBC: 13.5 10*3/uL — ABNORMAL HIGH (ref 3.4–10.8)

## 2020-01-08 LAB — LIPID PANEL WITH LDL/HDL RATIO
Cholesterol, Total: 146 mg/dL (ref 100–199)
HDL: 40 mg/dL (ref >39)
LDL Chol Calc (NIH): 92 mg/dL (ref 0–99)
LDL/HDL Ratio: 2.3 ratio (ref 0.0–3.2)
Triglycerides: 72 mg/dL (ref 0–149)
VLDL Cholesterol Cal: 14 mg/dL (ref 5–40)

## 2020-01-08 NOTE — Telephone Encounter (Signed)
-----   Message from Mar Daring, PA-C sent at 01/08/2020 10:02 AM EDT ----- WBC count has continued to increase compared to last year. This can be indicative of possible infection, could be due to smoking, or other blood disorders. Is she having any signs of URI or UTI? If not, can recheck in 4 weeks or so. Kidney and liver function are normal. Sodium, potassium and calcium is normal. Sugar is normal. Cholesterol is normal.

## 2020-01-08 NOTE — Telephone Encounter (Signed)
Reviewed lab results and provider's note with the patient. She denies having any recent URI/UTI symptoms. She will have labs redrawn after 4 weeks. Routing to clinic for lab orders.

## 2020-01-08 NOTE — Telephone Encounter (Signed)
Lab ordered.

## 2020-01-08 NOTE — Telephone Encounter (Signed)
LMTCB-if patient calls back ok for [facility] nurse to give results °

## 2020-01-12 ENCOUNTER — Telehealth: Payer: Self-pay | Admitting: Physician Assistant

## 2020-01-12 NOTE — Telephone Encounter (Signed)
Patient is calling back to schedule AWV. Please advise Cb- [phone]

## 2020-01-12 NOTE — Telephone Encounter (Signed)
Copied from [place] [phone]. Topic: Medicare AWV >> Jan 12, 2020  1:41 PM Cher Nakai R wrote: Reason for CRM:   Left message for patient to call back and schedule Medicare Annual Wellness Visit (AWV) either virtually or in office.  Last AWV 02/28/2017  Please schedule at anytime with [facility] Health Advisor.  If any questions, please contact me at [phone]

## 2020-02-05 ENCOUNTER — Telehealth: Payer: Self-pay

## 2020-02-05 DIAGNOSIS — Z87891 Personal history of nicotine dependence: Secondary | ICD-10-CM

## 2020-02-05 DIAGNOSIS — Z122 Encounter for screening for malignant neoplasm of respiratory organs: Secondary | ICD-10-CM

## 2020-02-05 NOTE — Telephone Encounter (Signed)
Contacted patient for lung CT screening clinic based on referral from [facility].  Burgess Estelle, lung navigator called patient in August 2021 and she asked him to have our clinic call her back in September.  I spoke to patient and she said she feels she is at a place she can take place in screening but needs to check with her daughter about transporting her to the CT scan.  She was given phone number to Burgess Estelle to call after she speaks with her daughter about transportation.

## 2020-02-11 NOTE — Telephone Encounter (Signed)
Spoke with daughter, confirmed smoking history, current smoker, 58 pack year. Scheduled for [facility] and CT 02/24/20 at 2pm

## 2020-02-11 NOTE — Addendum Note (Signed)
Addended by: Lieutenant Diego on: 02/11/2020 09:42 AM   Modules accepted: Orders

## 2020-02-24 ENCOUNTER — Inpatient Hospital Stay: Payer: Medicare HMO | Attending: Nurse Practitioner | Admitting: Nurse Practitioner

## 2020-02-24 ENCOUNTER — Other Ambulatory Visit: Payer: Self-pay

## 2020-02-24 ENCOUNTER — Ambulatory Visit
Admission: RE | Admit: 2020-02-24 | Discharge: 2020-02-24 | Disposition: A | Discharge status: Home / Self Care | Payer: Medicare HMO | Source: Ambulatory Visit | Attending: Nurse Practitioner | Admitting: Nurse Practitioner

## 2020-02-24 ENCOUNTER — Encounter: Payer: Self-pay | Admitting: Nurse Practitioner

## 2020-02-24 DIAGNOSIS — Z122 Encounter for screening for malignant neoplasm of respiratory organs: Secondary | ICD-10-CM | POA: Diagnosis not present

## 2020-02-24 DIAGNOSIS — Z87891 Personal history of nicotine dependence: Secondary | ICD-10-CM | POA: Diagnosis not present

## 2020-02-24 DIAGNOSIS — F17210 Nicotine dependence, cigarettes, uncomplicated: Secondary | ICD-10-CM | POA: Diagnosis not present

## 2020-02-24 NOTE — Progress Notes (Signed)
Virtual Visit via Video Enabled Telemedicine Note   I connected with Tracey Rogers on 02/24/20 at 2:00 PM EST by video enabled telemedicine visit and verified that I am speaking with the correct person using two identifiers.   I discussed the limitations, risks, security and privacy concerns of performing an evaluation and management service by telemedicine and the availability of in-person appointments. I also discussed with the patient that there may be a patient responsible charge related to this service. The patient expressed understanding and agreed to proceed.   Other persons participating in the visit and their role in the encounter: Burgess Estelle, RN- checking in patient & navigation  Patient's location: [place]  Provider's location: Clinic  Chief Complaint: Low Dose CT Screening  Patient agreed to evaluation by telemedicine to discuss shared decision making for consideration of low dose CT lung cancer screening.    In accordance with CMS guidelines, patient has met eligibility criteria including age, absence of signs or symptoms of lung cancer.  Social History   Tobacco Use  . Smoking status: Current Every Day Smoker    Packs/day: 1.00    Years: 58.00    Pack years: 58.00    Types: Cigarettes  . Smokeless tobacco: Never Used  . Tobacco comment: no smokers in her home  Substance Use Topics  . Alcohol use: No    Alcohol/week: 0.0 standard drinks     A shared decision-making session was conducted prior to the performance of CT scan. This includes one or more decision aids, includes benefits and harms of screening, follow-up diagnostic testing, over-diagnosis, false positive rate, and total radiation exposure.   Counseling on the importance of adherence to annual lung cancer LDCT screening, impact of co-morbidities, and ability or willingness to undergo diagnosis and treatment is imperative for compliance of the program.   Counseling on the importance of  continued smoking cessation for former smokers; the importance of smoking cessation for current smokers, and information about tobacco cessation interventions have been given to patient including [place] and [employer] Quit [place] programs.   Written order for lung cancer screening with LDCT has been given to the patient and any and all questions have been answered to the best of my abilities.    Yearly follow up will be coordinated by Burgess Estelle, Thoracic Navigator.  I discussed the assessment and treatment plan with the patient. The patient was provided an opportunity to ask questions and all were answered. The patient agreed with the plan and demonstrated an understanding of the instructions.   The patient was advised to call back or seek an in-person evaluation if the symptoms worsen or if the condition fails to improve as anticipated.   I provided 15 minutes of face-to-face video visit time during this encounter, and > 50% was spent counseling as documented under my assessment & plan.   Beckey Rutter, DNP, AGNP-C [place] at [facility] [phone] (clinic)

## 2020-02-26 ENCOUNTER — Encounter: Payer: Self-pay | Admitting: *Deleted

## 2020-04-30 ENCOUNTER — Other Ambulatory Visit: Payer: Self-pay

## 2020-04-30 ENCOUNTER — Encounter: Payer: Self-pay | Admitting: Physician Assistant

## 2020-04-30 ENCOUNTER — Ambulatory Visit (INDEPENDENT_AMBULATORY_CARE_PROVIDER_SITE_OTHER): Payer: Medicare HMO | Admitting: Physician Assistant

## 2020-04-30 VITALS — BP 133/69 | HR 75 | Temp 98.2°F | Resp 16 | Wt 156.0 lb

## 2020-04-30 DIAGNOSIS — Z5321 Procedure and treatment not carried out due to patient leaving prior to being seen by health care provider: Secondary | ICD-10-CM

## 2020-04-30 NOTE — Progress Notes (Signed)
Patient left without being seen.

## 2020-05-04 DIAGNOSIS — I739 Peripheral vascular disease, unspecified: Secondary | ICD-10-CM | POA: Diagnosis not present

## 2020-05-04 DIAGNOSIS — B351 Tinea unguium: Secondary | ICD-10-CM | POA: Diagnosis not present

## 2020-05-04 DIAGNOSIS — L97321 Non-pressure chronic ulcer of left ankle limited to breakdown of skin: Secondary | ICD-10-CM | POA: Diagnosis not present

## 2020-05-07 ENCOUNTER — Ambulatory Visit (INDEPENDENT_AMBULATORY_CARE_PROVIDER_SITE_OTHER): Payer: Medicare HMO | Admitting: Physician Assistant

## 2020-05-07 ENCOUNTER — Encounter: Payer: Self-pay | Admitting: Physician Assistant

## 2020-05-07 DIAGNOSIS — I700 Atherosclerosis of aorta: Secondary | ICD-10-CM | POA: Diagnosis not present

## 2020-05-07 DIAGNOSIS — L97929 Non-pressure chronic ulcer of unspecified part of left lower leg with unspecified severity: Secondary | ICD-10-CM

## 2020-05-07 DIAGNOSIS — J432 Centrilobular emphysema: Secondary | ICD-10-CM

## 2020-05-07 NOTE — Patient Instructions (Signed)

## 2020-05-07 NOTE — Progress Notes (Signed)
Virtual telephone visit    Virtual Visit via Telephone Note   This visit type was conducted due to national recommendations for restrictions regarding the COVID-19 Pandemic (e.g. social distancing) in an effort to limit this patient's exposure and mitigate transmission in our community. Due to her co-morbid illnesses, this patient is at least at moderate risk for complications without adequate follow up. This format is felt to be most appropriate for this patient at this time. The patient did not have access to video technology or had technical difficulties with video requiring transitioning to audio format only (telephone). Physical exam was limited to content and character of the telephone converstion.    Patient location: Home Provider location: [facility]  I discussed the limitations of evaluation and management by telemedicine and the availability of in person appointments. The patient expressed understanding and agreed to proceed.   Visit Date: 05/07/2020  Today's healthcare provider: Mar Daring, PA-C   No chief complaint on file.  Subjective    HPI  Tracey Rogers is a 74 yr old female that presents today to follow up on labs. Discussed results and she is doing well. She does have an ulcer on her left ankle that is healing well. She is followed by podiatry for this. Also wants to discuss lung cancer screening results.    Patient Active Problem List   Diagnosis Date Noted  . Centrilobular emphysema ([place]) 05/07/2020  . Pressure ulcer 10/20/2019  . Carotid stenosis 11/02/2017  . Chronic venous insufficiency 11/02/2017  . Atherosclerosis of native arteries of the extremities with ulceration ([place]) 08/14/2016  . Atherosclerosis 10/20/2015  . Ventral hernia without obstruction or gangrene 10/05/2015  . 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 Gallstones 02/19/2015  . History of alcohol use disorder 09/22/2014  . Allergic rhinitis 09/22/2014  . Chronic constipation  09/22/2014  . History of repeated overdose 09/22/2014  . Below normal amount of sodium in the blood 09/22/2014  . Depression, major, single episode 09/22/2014  . Arthritis, degenerative 09/22/2014  . OP (osteoporosis) 09/22/2014  . Psoriasis 09/22/2014  . Foot ulcer ([place]) 04/10/2014  . History of tobacco abuse 02/16/2014  . Ankle ulcer ([place]) 02/16/2014  . CAFL (chronic airflow limitation) ([place]) 02/16/2014  . Essential hypertension 02/16/2014  . 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 Subclavian artery stenosis, right ([place]) 02/16/2014  . Atherosclerosis of native arteries of the extremities with ulceration(440.23) 02/16/2014  . Leg ulcer ([place]) 02/10/2014   Past Medical History:  Diagnosis Date  . Arthritis   . Complication of anesthesia    gets weak after surgery  . Constipation   . COPD (chronic obstructive pulmonary disease) (H[facility])    mild  . Family history of adverse reaction to anesthesia    son gets postop nausea and vomiting, weak  . GERD (gastroesophageal reflux disease)   . Heart murmur   . Hypertension   . Peripheral vascular disease ([place])   . Psoriasis   . Psoriasis   . Skin ulcer of right great toe ([place])   . Ulcer of left ankle ([place])   . Wears dentures       Medications: Outpatient Medications Prior to Visit  Medication Sig  . acetaminophen (TYLENOL) 325 MG tablet Take 325 mg by mouth every 6 (six) hours as needed for moderate pain or headache.   Marland Kitchen amLODipine (NORVASC) 10 MG tablet Take 1 tablet (10 mg total) by mouth daily.  Marland Kitchen aspirin EC 81 MG tablet Take 81 mg by mouth every evening.  Marland Kitchen  atorvastatin (LIPITOR) 10 MG tablet Take 1 tablet (10 mg total) by mouth every evening.  . clopidogrel (PLAVIX) 75 MG tablet Take 1 tablet (75 mg total) by mouth daily.  . fluticasone (FLONASE) 50 MCG/ACT nasal spray Place 2 sprays into both nostrils daily. (Patient taking differently: Place 2 sprays into both nostrils daily as needed for allergies. )  .  Magnesium 400 MG CAPS Take 400 mg by mouth daily as needed (cramps).   . sertraline (ZOLOFT) 50 MG tablet Take 1 tablet (50 mg total) by mouth daily.  . silver sulfADIAZINE (SILVADENE) 1 % cream APPLY TO LEFT HEAL DAILY AND COVER WITH LIGHT GAUZE  . triamcinolone ointment (KENALOG) 0.1 % Apply 1 application topically 2 (two) times daily as needed.   No facility-administered medications prior to visit.    Review of Systems  Constitutional: Negative.   Respiratory: Negative.   Cardiovascular: Negative.   Neurological: Negative.     Last CBC Lab Results  Component Value Date   WBC 13.5 (H) 01/07/2020   HGB 12.9 01/07/2020   HCT 38.4 01/07/2020   MCV 92 01/07/2020   MCH 31.0 01/07/2020   RDW 13.0 01/07/2020   PLT 251 82/50/5397   Last metabolic panel Lab Results  Component Value Date   GLUCOSE 78 01/07/2020   NA 135 01/07/2020   K 4.4 01/07/2020   CL 100 01/07/2020   CO2 20 01/07/2020   BUN 9 01/07/2020   CREATININE 0.93 01/07/2020   GFRNONAA 61 01/07/2020   GFRAA 71 01/07/2020   CALCIUM 9.4 01/07/2020   PROT 7.0 01/07/2020   ALBUMIN 4.6 01/07/2020   LABGLOB 2.4 01/07/2020   AGRATIO 1.9 01/07/2020   BILITOT 0.6 01/07/2020   ALKPHOS 65 01/07/2020   AST 18 01/07/2020   ALT 17 01/07/2020   ANIONGAP 9 03/09/2015      Objective    There were no vitals taken for this visit. BP Readings from Last 3 Encounters:  04/30/20 133/69  10/20/19 (!) 153/79  09/29/19 (!) 162/72   Wt Readings from Last 3 Encounters:  04/30/20 156 lb (70.8 kg)  02/24/20 151 lb (68.5 kg)  09/29/19 148 lb 6.4 oz (67.3 kg)        Assessment & Plan     1. Centrilobular emphysema ([place]) Noted on lung cancer screening. Discussed smoking cessation, less than 5 minutes. Patient states not symptomatic enough for inhaler for treatment.   2. Ulcer of left lower extremity, unspecified ulcer stage ([place]) Reports healing. Saw podiatry on 05/04/20.   3. Aortic atherosclerosis ([place]) On medical  management. Again, discussed smoking cessation.    No follow-ups on file.    I discussed the assessment and treatment plan with the patient. The patient was provided an opportunity to ask questions and all were answered. The patient agreed with the plan and demonstrated an understanding of the instructions.   The patient was advised to call back or seek an in-person evaluation if the symptoms worsen or if the condition fails to improve as anticipated.  I provided 15 minutes of non-face-to-face time during this encounter.  Reynolds Bowl, PA-C, have reviewed all documentation for this visit. The documentation on 05/07/20 for the exam, diagnosis, procedures, and orders are all accurate and complete.  [facility] [phone] (phone) [phone] (fax)  [place]

## 2020-05-14 ENCOUNTER — Encounter: Payer: Self-pay | Admitting: Family Medicine

## 2020-05-14 LAB — FECAL OCCULT BLOOD, IMMUNOCHEMICAL: IFOBT: NEGATIVE

## 2020-05-20 ENCOUNTER — Other Ambulatory Visit: Payer: Self-pay

## 2020-05-20 ENCOUNTER — Encounter (INDEPENDENT_AMBULATORY_CARE_PROVIDER_SITE_OTHER): Payer: Self-pay | Admitting: Nurse Practitioner

## 2020-05-20 ENCOUNTER — Ambulatory Visit (INDEPENDENT_AMBULATORY_CARE_PROVIDER_SITE_OTHER): Payer: Medicare HMO | Admitting: Nurse Practitioner

## 2020-05-20 ENCOUNTER — Ambulatory Visit (INDEPENDENT_AMBULATORY_CARE_PROVIDER_SITE_OTHER): Payer: Medicare HMO

## 2020-05-20 VITALS — BP 144/81 | HR 69 | Ht 65.0 in | Wt 159.0 lb

## 2020-05-20 DIAGNOSIS — Z87891 Personal history of nicotine dependence: Secondary | ICD-10-CM | POA: Diagnosis not present

## 2020-05-20 DIAGNOSIS — I6523 Occlusion and stenosis of bilateral carotid arteries: Secondary | ICD-10-CM

## 2020-05-20 DIAGNOSIS — I10 Essential (primary) hypertension: Secondary | ICD-10-CM

## 2020-05-20 DIAGNOSIS — I7025 Atherosclerosis of native arteries of other extremities with ulceration: Secondary | ICD-10-CM

## 2020-06-06 ENCOUNTER — Encounter (INDEPENDENT_AMBULATORY_CARE_PROVIDER_SITE_OTHER): Payer: Self-pay | Admitting: Nurse Practitioner

## 2020-06-06 NOTE — Progress Notes (Signed)
Subjective:    Patient ID: Tracey Rogers, female    DOB: 1945/12/14, 75 y.o.   MRN: [id number] Chief Complaint  Patient presents with  . Follow-up    U/S    The patient returns to the office for followup and review of the noninvasive studies. There have been no interval changes in lower extremity symptoms. No interval shortening of the patient's claudication distance or development of rest pain symptoms. No new ulcers or wounds have occurred since the last visit.  There have been no significant changes to the patient's overall health care.  The patient denies amaurosis fugax or recent TIA symptoms. There are no recent neurological changes noted. The patient denies history of DVT, PE or superficial thrombophlebitis. The patient denies recent episodes of angina or shortness of breath.   ABI Rt=0.81 and Lt=0.9  (previous ABI's Rt=1.17 and Lt=1.12) Duplex ultrasound of the patient underwent bilateral arterial duplex which shows biphasic waveforms throughout the bilateral lower extremities.  All stents are open and patent.  It 30 to 49% stenosis noted at the distal right SFA.  Also a 50 to 74% stenosis noted at the left mid common femoral artery as well as the left mid SFA.   Review of Systems  All other systems reviewed and are negative.      Objective:   Physical Exam Vitals reviewed.  HENT:     Head: Normocephalic.  Cardiovascular:     Rate and Rhythm: Normal rate.     Pulses: Normal pulses.  Pulmonary:     Effort: Pulmonary effort is normal.  Skin:    General: Skin is warm and dry.  Neurological:     Mental Status: She is alert and oriented to person, place, and time.  Psychiatric:        Mood and Affect: Mood normal.        Behavior: Behavior normal.        Thought Content: Thought content normal.        Judgment: Judgment normal.     BP (!) 144/81   Pulse 69   Ht 5\' 5"  (1.651 m)   Wt 159 lb (72.1 kg)   BMI 26.46 kg/m   Past Medical History:  Diagnosis  Date  . Arthritis   . Complication of anesthesia    gets weak after surgery  . Constipation   . COPD (chronic obstructive pulmonary disease) (HCC)    mild  . Family history of adverse reaction to anesthesia    son gets postop nausea and vomiting, weak  . GERD (gastroesophageal reflux disease)   . Heart murmur   . Hypertension   . Peripheral vascular disease ([place])   . Psoriasis   . Psoriasis   . Skin ulcer of right great toe ([place])   . Ulcer of left ankle ([place])   . Wears dentures     Social History   Socioeconomic History  . Marital status: Widowed    Spouse name: Not on file  . Number of children: Not on file  . Years of education: Not on file  . Highest education level: Not on file  Occupational History  . Not on file  Tobacco Use  . Smoking status: Current Every Day Smoker    Packs/day: 1.00    Years: 58.00    Pack years: 58.00    Types: Cigarettes  . Smokeless tobacco: Never Used  . Tobacco comment: no smokers in her home  Vaping Use  . Vaping Use: Never used  Substance and Sexual Activity  . Alcohol use: No    Alcohol/week: 0.0 standard drinks  . Drug use: No  . Sexual activity: Not on file  Other Topics Concern  . Not on file  Social History Narrative  . Not on file   Social Determinants of Health   Financial Resource Strain: Not on file  Food Insecurity: Not on file  Transportation Needs: Not on file  Physical Activity: Not on file  Stress: Not on file  Social Connections: Not on file  Intimate Partner Violence: Not on file    Past Surgical History:  Procedure Laterality Date  . APPLICATION OF A-CELL OF EXTREMITY Left 04/13/2014   Procedure: PLACEMENT OF APPLICATION OF A-CELL ;  Surgeon: Theodoro Kos, DO;  Location: [facility];  Service: Plastics;  Laterality: Left;  . APPLICATION OF A-CELL OF EXTREMITY Left 09/10/2014   Procedure: APPLICATION OF A-CELL OF EXTREMITY;  Surgeon: Theodoro Kos, DO;  Location: [facility];  Service: Plastics;  Laterality: Left;  . CARDIOVASCULAR STRESS TEST  03-03-2014  dr Saralyn Pilar   normal lexi scan sestamibi study/  normal LVF without evidence for significant scar or ischemia  . CATARACT EXTRACTION W/ INTRAOCULAR LENS  IMPLANT, BILATERAL  2015  . CHOLECYSTECTOMY N/A 06/16/2015   Procedure: LAPAROSCOPIC CHOLECYSTECTOMY WITH CHOLANGIOGRAM;  Surgeon: Robert Bellow, MD;  Location: [facility];  Service: General;  Laterality: N/A;  . DILATION AND CURETTAGE OF UTERUS    . ENDOVASCULAR STENT INSERTION  sept  &  oct  2015   LEFT LEG STENTING--  common and superficial femoral artery  . ENDOVASCULAR STENT INSERTION  May 27, 2014   LifeStent bare metal stent left SFA  . HERNIA REPAIR  10/27/2015   Ventral hernia repaired with Ventrio ST mesh above previous umbilical port site  . I & D EXTREMITY Left 09/10/2014   Procedure: IRRIGATION AND DEBRIDEMENT LEFT ANKLE WOUND SURGICAL PREP ;  Surgeon: Theodoro Kos, DO;  Location: [facility];  Service: Plastics;  Laterality: Left;  . INCISION AND DRAINAGE OF WOUND Bilateral 04/13/2014   Procedure: IRRIGATION AND DEBRIDEMENT OF LEFT ANKLE WOUND AND RIGHT FOOT;  Surgeon: Theodoro Kos, DO;  Location: [facility];  Service: Plastics;  Laterality: Bilateral;  . LOWER EXTREMITY ANGIOGRAPHY Left 06/26/2017   Procedure: LOWER EXTREMITY ANGIOGRAPHY;  Surgeon: Katha Cabal, MD;  Location: [facility];  Service: Cardiovascular;  Laterality: Left;  . TRANSTHORACIC ECHOCARDIOGRAM  03-03-2014   normal LVF/  ef 55-60%/  mild TI and MI  . TUBAL LIGATION    . VENTRAL HERNIA REPAIR N/A 10/27/2015   Procedure: HERNIA REPAIR VENTRAL ADULT;  Surgeon: Robert Bellow, MD;  Location: [facility];  Service: General;  Laterality: N/A;    Family History  Problem Relation Age of Onset  . Kidney disease Mother   . Hypertension Mother   . Heart disease Mother   . Breast cancer Maternal Aunt 60  . Heart disease  Sister   . Stroke Sister     Allergies  Allergen Reactions  . Clarithromycin Rash    "Mycin"  . Methocarbamol Rash  . Minocin [Minocycline Hcl] Rash  . Other Rash    Optifoam applied to her left achilles wound.   . Tetracycline Rash  . Tetracyclines & Related Rash    CBC Latest Ref Rng & Units 01/07/2020 11/27/2018 02/28/2017  WBC 3.4 - 10.8 x10E3/uL 13.5(H) 11.6(H) 10.4  Hemoglobin 11.1 - 15.9 g/dL 12.9 13.7  13.3  Hematocrit 34.0 - 46.6 % 38.4 41.0 39.0  Platelets 150 - 450 x10E3/uL 251 288 283      CMP     Component Value Date/Time   NA 135 01/07/2020 1132   NA 128 (L) 05/27/2014 1253   K 4.4 01/07/2020 1132   K 4.2 05/27/2014 1253   CL 100 01/07/2020 1132   CL 97 (L) 05/27/2014 1253   CO2 20 01/07/2020 1132   CO2 25 05/27/2014 1253   GLUCOSE 78 01/07/2020 1132   GLUCOSE 111 (H) 02/28/2017 1105   GLUCOSE 84 05/27/2014 1253   BUN 9 01/07/2020 1132   BUN 9 05/27/2014 1253   CREATININE 0.93 01/07/2020 1132   CREATININE 0.82 02/28/2017 1105   CALCIUM 9.4 01/07/2020 1132   CALCIUM 9.1 05/27/2014 1253   PROT 7.0 01/07/2020 1132   ALBUMIN 4.6 01/07/2020 1132   AST 18 01/07/2020 1132   ALT 17 01/07/2020 1132   ALKPHOS 65 01/07/2020 1132   BILITOT 0.6 01/07/2020 1132   GFRNONAA 61 01/07/2020 1132   GFRNONAA 72 02/28/2017 1105   GFRAA 71 01/07/2020 1132   GFRAA 84 02/28/2017 1105     VAS [place] ABI WITH/WO TBI  Result Date: 05/31/2020 LOWER EXTREMITY DOPPLER STUDY Indications: Ulceration, and peripheral artery disease.  Vascular Interventions: Multiple interventions of bilateral lower extremities. Performing Technologist: Blondell Reveal RT, RDMS, RVT  Examination Guidelines: A complete evaluation includes at minimum, Doppler waveform signals and systolic blood pressure reading at the level of bilateral brachial, anterior tibial, and posterior tibial arteries, when vessel segments are accessible. Bilateral testing is considered an integral part of a complete examination.  Photoelectric Plethysmograph (PPG) waveforms and toe systolic pressure readings are included as required and additional duplex testing as needed. Limited examinations for reoccurring indications may be performed as noted.  ABI Findings: +--------+------------------+-----+--------+-----------------------------------+ Right   Rt Pressure (mmHg)IndexWaveformComment                             +--------+------------------+-----+--------+-----------------------------------+ Brachial182                    biphasic                                    +--------+------------------+-----+--------+-----------------------------------+ ATA     158               0.87 biphasic                                    +--------+------------------+-----+--------+-----------------------------------+ PTA     147               0.81 biphasic                                    +--------+------------------+-----+--------+-----------------------------------+ 2nd Toe 114               0.63 Normal  great toe pressure not obtained due                                        to small size                       +--------+------------------+-----+--------+-----------------------------------+ +---------+------------------+-----+----------+-------+  Left     Lt Pressure (mmHg)IndexWaveform  Comment +---------+------------------+-----+----------+-------+ Brachial 98                     monophasic        +---------+------------------+-----+----------+-------+ ATA      174               0.96 biphasic          +---------+------------------+-----+----------+-------+ PTA      172               0.95 biphasic          +---------+------------------+-----+----------+-------+ Great Toe134               0.74 Normal            +---------+------------------+-----+----------+-------+ +-------+-----------+-----------+------------+------------+ ABI/TBIToday's ABIToday's TBIPrevious ABIPrevious TBI  +-------+-----------+-----------+------------+------------+ Right  0.87       0.63       0.81        0.82         +-------+-----------+-----------+------------+------------+ Left   0.96       0.74       0.92        0.69         +-------+-----------+-----------+------------+------------+ Flow in the left vertebral artery was not adequately visualized. Monophasic flow noted in the left subclavian artery.  Bilateral ABIs appear essentially unchanged compared to prior study on 10/20/19.  Summary: Right: Resting right ankle-brachial index indicates mild right lower extremity arterial disease. Mildly decreased toe-brachial index. Left: Resting left ankle-brachial index is within normal range. No evidence of significant left lower extremity arterial disease. The left toe-brachial index is normal. Known significant difference in the bilateral brachial pressure with evidence of left subclavian artery level occlusive disease, as described above.  *See table(s) above for measurements and observations.  Electronically signed by Hortencia Pilar MD on 05/31/2020 at 5:19:29 PM.    Final        Assessment & Plan:   1. Atherosclerosis of native arteries of the extremities with ulceration ([place])  Recommend:  The patient has evidence of atherosclerosis of the lower extremities with claudication.  The patient does not voice lifestyle limiting changes at this point in time.  Noninvasive studies do not suggest clinically significant change.  No invasive studies, angiography or surgery at this time The patient should continue walking and begin a more formal exercise program.  The patient should continue antiplatelet therapy and aggressive treatment of the lipid abnormalities  No changes in the patient's medications at this time  The patient should continue wearing graduated compression socks 10-15 mmHg strength to control the mild edema.    2. History of tobacco abuse Smoking cessation was discussed, 3-10  minutes spent on this topic specifically   3. Essential hypertension Continue antihypertensive medications as already ordered, these medications have been reviewed and there are no changes at this time.   4. Bilateral carotid artery stenosis Carotid stenosis stable with no signs symptoms of worsening.  We will check carotid duplex at follow-up visit.   Current Outpatient Medications on File Prior to Visit  Medication Sig Dispense Refill  . acetaminophen (TYLENOL) 325 MG tablet Take 325 mg by mouth every 6 (six) hours as needed for moderate pain or headache.     Marland Kitchen amLODipine (NORVASC) 10 MG tablet Take 1 tablet (10 mg total) by mouth daily. 90 tablet 3  . aspirin EC 81 MG tablet Take 81 mg by mouth every evening.    Marland Kitchen  atorvastatin (LIPITOR) 10 MG tablet Take 1 tablet (10 mg total) by mouth every evening. 90 tablet 3  . clopidogrel (PLAVIX) 75 MG tablet Take 1 tablet (75 mg total) by mouth daily. 90 tablet 3  . fluticasone (FLONASE) 50 MCG/ACT nasal spray Place 2 sprays into both nostrils daily. (Patient taking differently: Place 2 sprays into both nostrils daily as needed for allergies.) 16 g 2  . Magnesium 400 MG CAPS Take 400 mg by mouth daily as needed (cramps).     . silver sulfADIAZINE (SILVADENE) 1 % cream APPLY TO LEFT HEAL DAILY AND COVER WITH LIGHT GAUZE 50 g 0  . triamcinolone ointment (KENALOG) 0.1 % Apply 1 application topically 2 (two) times daily as needed.     No current facility-administered medications on file prior to visit.    There are no Patient Instructions on file for this visit. No follow-ups on file.   Kris Hartmann, NP

## 2020-06-15 NOTE — Progress Notes (Signed)
Subjective:   Tracey Rogers is a 75 y.o. female who presents for Medicare Annual (Subsequent) preventive examination.  I connected with Tracey Rogers today by telephone and verified that I am speaking with the correct person using two identifiers. Location patient: home Location provider: work Persons participating in the virtual visit: patient, provider.   I discussed the limitations, risks, security and privacy concerns of performing an evaluation and management service by telephone and the availability of in person appointments. I also discussed with the patient that there may be a patient responsible charge related to this service. The patient expressed understanding and verbally consented to this telephonic visit.    Interactive audio and video telecommunications were attempted between this provider and patient, however failed, due to patient having technical difficulties OR patient did not have access to video capability.  We continued and completed visit with audio only.   Review of Systems    N/A  Cardiac Risk Factors include: hypertension;advanced age (>65mn, >>64women);dyslipidemia     Objective:    Today's Vitals   06/16/20 1355  PainSc: 4    There is no height or weight on file to calculate BMI.  Advanced Directives 06/16/2020 06/26/2017 02/28/2017 02/15/2017 08/14/2016 10/20/2015 10/19/2015  Does Patient Have a Medical Advance Directive? Yes Yes Yes Yes Yes Yes Yes  Type of AParamedicof AMount CarmelLiving will HBleckleyLiving will HP[place]Living will H[place]tonLiving will HHidden Valley LakeLiving will Living will;Healthcare Power of Attorney -  Does patient want to make changes to medical advance directive? - - - - - - No - Patient declined  Copy of HSaxmanin Chart? No - copy requested No - copy requested - - - - -    Current Medications  (verified) Outpatient Encounter Medications as of 06/16/2020  Medication Sig  . acetaminophen (TYLENOL) 325 MG tablet Take 325 mg by mouth every 6 (six) hours as needed for moderate pain or headache.   .Marland KitchenamLODipine (NORVASC) 10 MG tablet Take 1 tablet (10 mg total) by mouth daily.  .Marland Kitchenaspirin EC 81 MG tablet Take 81 mg by mouth every evening.  .Marland Kitchenatorvastatin (LIPITOR) 10 MG tablet Take 1 tablet (10 mg total) by mouth every evening.  . clopidogrel (PLAVIX) 75 MG tablet Take 1 tablet (75 mg total) by mouth daily.  . fluticasone (FLONASE) 50 MCG/ACT nasal spray Place 2 sprays into both nostrils daily. (Patient taking differently: Place 2 sprays into both nostrils daily as needed for allergies.)  . Magnesium 400 MG CAPS Take 400 mg by mouth daily as needed (cramps).   . silver sulfADIAZINE (SILVADENE) 1 % cream APPLY TO LEFT HEAL DAILY AND COVER WITH LIGHT GAUZE  . triamcinolone ointment (KENALOG) 0.1 % Apply 1 application topically 2 (two) times daily as needed.   No facility-administered encounter medications on file as of 06/16/2020.    Allergies (verified) Clarithromycin, Methocarbamol, Minocin [minocycline hcl], Other, Tetracycline, and Tetracyclines & related   History: Past Medical History:  Diagnosis Date  . Arthritis   . Complication of anesthesia    gets weak after surgery  . Constipation   . COPD (chronic obstructive pulmonary disease) (HCC)    mild  . Family history of adverse reaction to anesthesia    son gets postop nausea and vomiting, weak  . GERD (gastroesophageal reflux disease)   . Heart murmur   . Hypertension   . Peripheral vascular disease ([place]   .  Psoriasis   . Psoriasis   . Skin ulcer of right great toe ([place])   . Ulcer of left ankle ([place])   . Wears dentures    Past Surgical History:  Procedure Laterality Date  . APPLICATION OF A-CELL OF EXTREMITY Left 04/13/2014   Procedure: PLACEMENT OF APPLICATION OF A-CELL ;  Surgeon: Theodoro Kos, DO;  Location:  [place];  Service: Plastics;  Laterality: Left;  . APPLICATION OF A-CELL OF EXTREMITY Left 09/10/2014   Procedure: APPLICATION OF A-CELL OF EXTREMITY;  Surgeon: Theodoro Kos, DO;  Location: [place];  Service: Plastics;  Laterality: Left;  . CARDIOVASCULAR STRESS TEST  03-03-2014  dr Saralyn Pilar   normal lexi scan sestamibi study/  normal LVF without evidence for significant scar or ischemia  . CATARACT EXTRACTION W/ INTRAOCULAR LENS  IMPLANT, BILATERAL  2015  . CHOLECYSTECTOMY N/A 06/16/2015   Procedure: LAPAROSCOPIC CHOLECYSTECTOMY WITH CHOLANGIOGRAM;  Surgeon: Robert Bellow, MD;  Location: [facility];  Service: General;  Laterality: N/A;  . DILATION AND CURETTAGE OF UTERUS    . ENDOVASCULAR STENT INSERTION  sept  &  oct  2015   LEFT LEG STENTING--  common and superficial femoral artery  . ENDOVASCULAR STENT INSERTION  May 27, 2014   LifeStent bare metal stent left SFA  . HERNIA REPAIR  10/27/2015   Ventral hernia repaired with Ventrio ST mesh above previous umbilical port site  . I & D EXTREMITY Left 09/10/2014   Procedure: IRRIGATION AND DEBRIDEMENT LEFT ANKLE WOUND SURGICAL PREP ;  Surgeon: Theodoro Kos, DO;  Location: [place];  Service: Plastics;  Laterality: Left;  . INCISION AND DRAINAGE OF WOUND Bilateral 04/13/2014   Procedure: IRRIGATION AND DEBRIDEMENT OF LEFT ANKLE WOUND AND RIGHT FOOT;  Surgeon: Theodoro Kos, DO;  Location: [place];  Service: Plastics;  Laterality: Bilateral;  . LOWER EXTREMITY ANGIOGRAPHY Left 06/26/2017   Procedure: LOWER EXTREMITY ANGIOGRAPHY;  Surgeon: Katha Cabal, MD;  Location: [place] [facility];  Service: Cardiovascular;  Laterality: Left;  . TRANSTHORACIC ECHOCARDIOGRAM  03-03-2014   normal LVF/  ef 55-60%/  mild TI and MI  . TUBAL LIGATION    . VENTRAL HERNIA REPAIR N/A 10/27/2015   Procedure: HERNIA REPAIR VENTRAL ADULT;  Surgeon: Robert Bellow, MD;  Location:  [facility];  Service: General;  Laterality: N/A;   Family History  Problem Relation Age of Onset  . Kidney disease Mother   . Hypertension Mother   . Heart disease Mother   . Breast cancer Maternal Aunt 60  . Heart disease Sister   . Stroke Sister    Social History   Socioeconomic History  . Marital status: Widowed    Spouse name: Not on file  . Number of children: 2  . Years of education: Not on file  . Highest education level: High school graduate  Occupational History  . Occupation: retired  Tobacco Use  . Smoking status: Current Every Day Smoker    Packs/day: 0.25    Years: 58.00    Pack years: 14.50    Types: Cigarettes  . Smokeless tobacco: Never Used  . Tobacco comment: no smokers in her home  Vaping Use  . Vaping Use: Never used  Substance and Sexual Activity  . Alcohol use: No    Alcohol/week: 0.0 standard drinks  . Drug use: No  . Sexual activity: Not on file  Other Topics Concern  . Not on file  Social History Narrative  .  Not on file   Social Determinants of Health   Financial Resource Strain: Low Risk   . Difficulty of Paying Living Expenses: Not hard at all  Food Insecurity: No Food Insecurity  . Worried About Charity fundraiser in the Last Year: Never true  . Ran Out of Food in the Last Year: Never true  Transportation Needs: No Transportation Needs  . Lack of Transportation (Medical): No  . Lack of Transportation (Non-Medical): No  Physical Activity: Inactive  . Days of Exercise per Week: 0 days  . Minutes of Exercise per Session: 0 min  Stress: No Stress Concern Present  . Feeling of Stress : Not at all  Social Connections: Moderately Isolated  . Frequency of Communication with Friends and Family: More than three times a week  . Frequency of Social Gatherings with Friends and Family: More than three times a week  . Attends Religious Services: 1 to 4 times per year  . Active Member of Clubs or Organizations: No  . Attends Theatre manager Meetings: Never  . Marital Status: Widowed    Tobacco Counseling Ready to quit: Yes Counseling given: No Comment: no smokers in her home   Clinical Intake:  Pre-visit preparation completed: Yes  Pain : 0-10 Pain Score: 4  Pain Type: Chronic pain Pain Location: Back Pain Orientation: Lower Pain Descriptors / Indicators: Aching Pain Frequency: Intermittent Pain Relieving Factors: Pt takes Tylenol as needed for pain.  Pain Relieving Factors: Pt takes Tylenol as needed for pain.  Nutritional Risks: None Diabetes: No  How often do you need to have someone help you when you read instructions, pamphlets, or other written materials from your doctor or pharmacy?: 1 - Never  Diabetic? No  Interpreter Needed?: No  Information entered by :: [facility], LPN   Activities of Daily Living In your present state of health, do you have any difficulty performing the following activities: 06/16/2020 04/30/2020  Hearing? N Y  Comment Wear bilateral hearing aids. -  Vision? N N  Difficulty concentrating or making decisions? N N  Walking or climbing stairs? N Y  Dressing or bathing? N N  Doing errands, shopping? N N  Preparing Food and eating ? N -  Using the Toilet? N -  In the past six months, have you accidently leaked urine? N -  Do you have problems with loss of bowel control? N -  Managing your Medications? N -  Managing your Finances? N -  Housekeeping or managing your Housekeeping? N -  Some recent data might be hidden    Patient Care Team: Mar Daring, PA-C as PCP - General (Family Medicine) Leandrew Koyanagi, MD as Referring Physician (Ophthalmology) Delana Meyer, Dolores Lory, MD (Vascular Surgery) Kris Hartmann, NP as Nurse Practitioner (Vascular Surgery) Sharlotte Alamo, DPM (Podiatry)  Indicate any recent Medical Services you may have received from other than [facility] providers in the past year (date may be approximate).     Assessment:   This is a  routine wellness examination for Starlet.  Hearing/Vision screen No exam data present  Dietary issues and exercise activities discussed: Current Exercise Habits: The patient does not participate in regular exercise at present, Exercise limited by: orthopedic condition(s)  Goals    . Quit Smoking     Recommend to continue efforts to reduce smoking habits until no longer smoking.       Depression Screen PHQ 2/9 Scores 06/16/2020 04/30/2020 02/28/2017 02/23/2016 02/19/2015  PHQ - 2 Score 0 0  0 0 0  PHQ- 9 Score - 0 - - -    Fall Risk Fall Risk  06/16/2020 04/30/2020 02/28/2017 02/23/2016 02/19/2015  Falls in the past year? 0 0 No No Yes  Number falls in past yr: 0 0 - - -  Injury with Fall? 0 0 - - Yes  Risk for fall due to : - No Fall Risks - - -  Follow up - Falls evaluation completed - - -    FALL RISK PREVENTION PERTAINING TO THE HOME:  Any stairs in or around the home? No  If so, are there any without handrails? No  Home free of loose throw rugs in walkways, pet beds, electrical cords, etc? Yes  Adequate lighting in your home to reduce risk of falls? Yes   ASSISTIVE DEVICES UTILIZED TO PREVENT FALLS:  Life alert? No  Use of a cane, walker or w/c? Yes  Grab bars in the bathroom? Yes  Shower chair or bench in shower? Yes  Elevated toilet seat or a handicapped toilet? Yes    Cognitive Function: Normal cognitive status assessed by observation by this Nurse Health Advisor. No abnormalities found.         Immunizations Immunization History  Administered Date(s) Administered  . PFIZER Comirnaty(Gray Top)Covid-19 Tri-Sucrose Vaccine 01/07/2020, 01/28/2020  . Pneumococcal Conjugate-13 02/19/2015  . Pneumococcal Polysaccharide-23 02/23/2016    TDAP status: Due, Education has been provided regarding the importance of this vaccine. Advised may receive this vaccine at local pharmacy or Health Dept. Aware to provide a copy of the vaccination record if obtained from local pharmacy  or Health Dept. Verbalized acceptance and understanding.  Flu Vaccine status: Declined, Education has been provided regarding the importance of this vaccine but patient still declined. Advised may receive this vaccine at local pharmacy or Health Dept. Aware to provide a copy of the vaccination record if obtained from local pharmacy or Health Dept. Verbalized acceptance and understanding.  Pneumococcal vaccine status: Up to date  Covid-19 vaccine status: Completed vaccines  Qualifies for Shingles Vaccine? Yes   Zostavax completed No   Shingrix Completed?: No.    Education has been provided regarding the importance of this vaccine. Patient has been advised to call insurance company to determine out of pocket expense if they have not yet received this vaccine. Advised may also receive vaccine at local pharmacy or Health Dept. Verbalized acceptance and understanding.  Screening Tests Health Maintenance  Topic Date Due  . DEXA SCAN  03/27/2019  . Fecal DNA (Cologuard)  03/20/2020  . INFLUENZA VACCINE  08/26/2020 (Originally 12/28/2019)  . TETANUS/TDAP  06/16/2021 (Originally 03/02/1965)  . Hepatitis C Screening  06/16/2021 (Originally 1946-03-04)  . COVID-19 Vaccine (3 - Booster for Pfizer series) 07/27/2020  . MAMMOGRAM  01/06/2022  . PNA vac Low Risk Adult  Completed    Health Maintenance  Health Maintenance Due  Topic Date Due  . DEXA SCAN  03/27/2019  . Fecal DNA (Cologuard)  03/20/2020    Colorectal cancer screening: Cologuard kit completed through insurance. Pt awaiting results. Requested results once she receives them.   Mammogram status: Completed 01/07/20. Repeat every year  Bone Density status: Ordered today. Pt provided with contact info and advised to call to schedule appt.  Lung Cancer Screening: (Low Dose CT Chest recommended if Age 59-80 years, 30 pack-year currently smoking OR have quit w/in 15years.) does qualify however completed this 02/24/20. Repeat yearly.    Additional Screening:  Hepatitis C Screening: does qualify however declines completing.  Vision Screening: Recommended annual ophthalmology exams for early detection of glaucoma and other disorders of the eye. Is the patient up to date with their annual eye exam?  Yes  Who is the provider or what is the name of the office in which the patient attends annual eye exams? Dr Wallace Going @ [facility] If pt is not established with a provider, would they like to be referred to a provider to establish care? No .   Dental Screening: Recommended annual dental exams for proper oral hygiene  Community Resource Referral / Chronic Care Management: CRR required this visit?  No   CCM required this visit?  No      Plan:     I have personally reviewed and noted the following in the patient's chart:   . Medical and social history . Use of alcohol, tobacco or illicit drugs  . Current medications and supplements . Functional ability and status . Nutritional status . Physical activity . Advanced directives . List of other physicians . Hospitalizations, surgeries, and ER visits in previous 12 months . Vitals . Screenings to include cognitive, depression, and falls . Referrals and appointments  In addition, I have reviewed and discussed with patient certain preventive protocols, quality metrics, and best practice recommendations. A written personalized care plan for preventive services as well as general preventive health recommendations were provided to patient.     Rainn Bullinger [place], [place]   9/98/7215   Nurse Notes: Pt has completed a cologuard kit for her insurance but has not receive her results yet. Requested results once advised. Pt declines receiving a future flu vaccine or Hep C lab order.

## 2020-06-16 ENCOUNTER — Ambulatory Visit (INDEPENDENT_AMBULATORY_CARE_PROVIDER_SITE_OTHER): Payer: Medicare HMO

## 2020-06-16 ENCOUNTER — Other Ambulatory Visit: Payer: Self-pay

## 2020-06-16 DIAGNOSIS — Z0000 Encounter for general adult medical examination without abnormal findings: Secondary | ICD-10-CM | POA: Diagnosis not present

## 2020-06-16 DIAGNOSIS — Z1211 Encounter for screening for malignant neoplasm of colon: Secondary | ICD-10-CM

## 2020-06-16 DIAGNOSIS — M810 Age-related osteoporosis without current pathological fracture: Secondary | ICD-10-CM

## 2020-06-16 NOTE — Patient Instructions (Signed)
Ms. Tracey Rogers , Thank you for taking time to come for your Medicare Wellness Visit. I appreciate your ongoing commitment to your health goals. Please review the following plan we discussed and let me know if I can assist you in the future.   Screening recommendations/referrals: Colonoscopy: Cologuard completed within the last month. Patient awaiting results from insurance company. Requested results once received.  Mammogram: Up to date, due 12/2020 Bone Density: Ordered today. Pt aware office will contact her to schedule apt.  Recommended yearly ophthalmology/optometry visit for glaucoma screening and checkup Recommended yearly dental visit for hygiene and checkup  Vaccinations: Influenza vaccine: Currently due, declined receiving. Pneumococcal vaccine: Completed series Tdap vaccine: Currently due, declined receiving. Shingles vaccine: Shingrix discussed. Please contact your pharmacy for coverage information.     Advanced directives: Please bring a copy of your POA (Power of Attorney) and/or Living Will to your next appointment.   Conditions/risks identified: Smoking cessation discussed today.   Next appointment: 11/08/20 @ 2:00 PM with Theodore 75 Years and Older, Female Preventive care refers to lifestyle choices and visits with your health care provider that can promote health and wellness. What does preventive care include?  A yearly physical exam. This is also called an annual well check.  Dental exams once or twice a year.  Routine eye exams. Ask your health care provider how often you should have your eyes checked.  Personal lifestyle choices, including:  Daily care of your teeth and gums.  Regular physical activity.  Eating a healthy diet.  Avoiding tobacco and drug use.  Limiting alcohol use.  Practicing safe sex.  Taking low-dose aspirin every day.  Taking vitamin and mineral supplements as recommended by your health care  provider. What happens during an annual well check? The services and screenings done by your health care provider during your annual well check will depend on your age, overall health, lifestyle risk factors, and family history of disease. Counseling  Your health care provider may ask you questions about your:  Alcohol use.  Tobacco use.  Drug use.  Emotional well-being.  Home and relationship well-being.  Sexual activity.  Eating habits.  History of falls.  Memory and ability to understand (cognition).  Work and work Statistician.  Reproductive health. Screening  You may have the following tests or measurements:  Height, weight, and BMI.  Blood pressure.  Lipid and cholesterol levels. These may be checked every 5 years, or more frequently if you are over 28 years old.  Skin check.  Lung cancer screening. You may have this screening every year starting at age 75 if you have a 30-pack-year history of smoking and currently smoke or have quit within the past 15 years.  Fecal occult blood test (FOBT) of the stool. You may have this test every year starting at age 75.  Flexible sigmoidoscopy or colonoscopy. You may have a sigmoidoscopy every 5 years or a colonoscopy every 10 years starting at age 75.  Hepatitis C blood test.  Hepatitis B blood test.  Sexually transmitted disease (STD) testing.  Diabetes screening. This is done by checking your blood sugar (glucose) after you have not eaten for a while (fasting). You may have this done every 1-3 years.  Bone density scan. This is done to screen for osteoporosis. You may have this done starting at age 75.  Mammogram. This may be done every 1-2 years. Talk to your health care provider about how often you should have regular mammograms.  Talk with your health care provider about your test results, treatment options, and if necessary, the need for more tests. Vaccines  Your health care provider may recommend certain  vaccines, such as:  Influenza vaccine. This is recommended every year.  Tetanus, diphtheria, and acellular pertussis (Tdap, Td) vaccine. You may need a Td booster every 10 years.  Zoster vaccine. You may need this after age 56.  Pneumococcal 13-valent conjugate (PCV13) vaccine. One dose is recommended after age 20.  Pneumococcal polysaccharide (PPSV23) vaccine. One dose is recommended after age 75. Talk to your health care provider about which screenings and vaccines you need and how often you need them. This information is not intended to replace advice given to you by your health care provider. Make sure you discuss any questions you have with your health care provider. Document Released: 06/11/2015 Document Revised: 02/02/2016 Document Reviewed: 03/16/2015 Elsevier Interactive Patient Education  2017 [place] Prevention in the Home Falls can cause injuries. They can happen to people of all ages. There are many things you can do to make your home safe and to help prevent falls. What can I do on the outside of my home?  Regularly fix the edges of walkways and driveways and fix any cracks.  Remove anything that might make you trip as you walk through a door, such as a raised step or threshold.  Trim any bushes or trees on the path to your home.  Use bright outdoor lighting.  Clear any walking paths of anything that might make someone trip, such as rocks or tools.  Regularly check to see if handrails are loose or broken. Make sure that both sides of any steps have handrails.  Any raised decks and porches should have guardrails on the edges.  Have any leaves, snow, or ice cleared regularly.  Use sand or salt on walking paths during winter.  Clean up any spills in your garage right away. This includes oil or grease spills. What can I do in the bathroom?  Use night lights.  Install grab bars by the toilet and in the tub and shower. Do not use towel bars as grab  bars.  Use non-skid mats or decals in the tub or shower.  If you need to sit down in the shower, use a plastic, non-slip stool.  Keep the floor dry. Clean up any water that spills on the floor as soon as it happens.  Remove soap buildup in the tub or shower regularly.  Attach bath mats securely with double-sided non-slip rug tape.  Do not have throw rugs and other things on the floor that can make you trip. What can I do in the bedroom?  Use night lights.  Make sure that you have a light by your bed that is easy to reach.  Do not use any sheets or blankets that are too big for your bed. They should not hang down onto the floor.  Have a firm chair that has side arms. You can use this for support while you get dressed.  Do not have throw rugs and other things on the floor that can make you trip. What can I do in the kitchen?  Clean up any spills right away.  Avoid walking on wet floors.  Keep items that you use a lot in easy-to-reach places.  If you need to reach something above you, use a strong step stool that has a grab bar.  Keep electrical cords out of the way.  Do not use floor polish or wax that makes floors slippery. If you must use wax, use non-skid floor wax.  Do not have throw rugs and other things on the floor that can make you trip. What can I do with my stairs?  Do not leave any items on the stairs.  Make sure that there are handrails on both sides of the stairs and use them. Fix handrails that are broken or loose. Make sure that handrails are as long as the stairways.  Check any carpeting to make sure that it is firmly attached to the stairs. Fix any carpet that is loose or worn.  Avoid having throw rugs at the top or bottom of the stairs. If you do have throw rugs, attach them to the floor with carpet tape.  Make sure that you have a light switch at the top of the stairs and the bottom of the stairs. If you do not have them, ask someone to add them for  you. What else can I do to help prevent falls?  Wear shoes that:  Do not have high heels.  Have rubber bottoms.  Are comfortable and fit you well.  Are closed at the toe. Do not wear sandals.  If you use a stepladder:  Make sure that it is fully opened. Do not climb a closed stepladder.  Make sure that both sides of the stepladder are locked into place.  Ask someone to hold it for you, if possible.  Clearly mark and make sure that you can see:  Any grab bars or handrails.  First and last steps.  Where the edge of each step is.  Use tools that help you move around (mobility aids) if they are needed. These include:  Canes.  Walkers.  Scooters.  Crutches.  Turn on the lights when you go into a dark area. Replace any light bulbs as soon as they burn out.  Set up your furniture so you have a clear path. Avoid moving your furniture around.  If any of your floors are uneven, fix them.  If there are any pets around you, be aware of where they are.  Review your medicines with your doctor. Some medicines can make you feel dizzy. This can increase your chance of falling. Ask your doctor what other things that you can do to help prevent falls. This information is not intended to replace advice given to you by your health care provider. Make sure you discuss any questions you have with your health care provider. Document Released: 03/11/2009 Document Revised: 10/21/2015 Document Reviewed: 06/19/2014 Elsevier Interactive Patient Education  2017 [employer].

## 2020-06-21 NOTE — Addendum Note (Signed)
Addended by: Fabio Neighbors A on: 06/21/2020 03:54 PM   Modules accepted: Orders, SmartSet

## 2020-07-15 ENCOUNTER — Other Ambulatory Visit: Payer: Medicare HMO

## 2020-07-21 ENCOUNTER — Other Ambulatory Visit: Payer: Self-pay

## 2020-07-21 ENCOUNTER — Ambulatory Visit
Admission: RE | Admit: 2020-07-21 | Discharge: 2020-07-21 | Disposition: A | Discharge status: Home / Self Care | Payer: Medicare HMO | Source: Ambulatory Visit | Attending: Physician Assistant | Admitting: Physician Assistant

## 2020-07-21 DIAGNOSIS — M810 Age-related osteoporosis without current pathological fracture: Secondary | ICD-10-CM | POA: Insufficient documentation

## 2020-07-23 DIAGNOSIS — Z1211 Encounter for screening for malignant neoplasm of colon: Secondary | ICD-10-CM | POA: Diagnosis not present

## 2020-07-25 LAB — COLOGUARD: Cologuard: NEGATIVE

## 2020-08-04 LAB — COLOGUARD: COLOGUARD: NEGATIVE

## 2020-08-31 DIAGNOSIS — I739 Peripheral vascular disease, unspecified: Secondary | ICD-10-CM | POA: Diagnosis not present

## 2020-08-31 DIAGNOSIS — L97321 Non-pressure chronic ulcer of left ankle limited to breakdown of skin: Secondary | ICD-10-CM | POA: Diagnosis not present

## 2020-08-31 DIAGNOSIS — B351 Tinea unguium: Secondary | ICD-10-CM | POA: Diagnosis not present

## 2020-08-31 DIAGNOSIS — M79674 Pain in right toe(s): Secondary | ICD-10-CM | POA: Diagnosis not present

## 2020-08-31 DIAGNOSIS — M79675 Pain in left toe(s): Secondary | ICD-10-CM | POA: Diagnosis not present

## 2020-09-22 DIAGNOSIS — I739 Peripheral vascular disease, unspecified: Secondary | ICD-10-CM | POA: Diagnosis not present

## 2020-09-22 DIAGNOSIS — L97321 Non-pressure chronic ulcer of left ankle limited to breakdown of skin: Secondary | ICD-10-CM | POA: Diagnosis not present

## 2020-11-03 DIAGNOSIS — B351 Tinea unguium: Secondary | ICD-10-CM | POA: Diagnosis not present

## 2020-11-03 DIAGNOSIS — I739 Peripheral vascular disease, unspecified: Secondary | ICD-10-CM | POA: Diagnosis not present

## 2020-11-03 DIAGNOSIS — M79674 Pain in right toe(s): Secondary | ICD-10-CM | POA: Diagnosis not present

## 2020-11-03 DIAGNOSIS — M79675 Pain in left toe(s): Secondary | ICD-10-CM | POA: Diagnosis not present

## 2020-11-03 DIAGNOSIS — L97321 Non-pressure chronic ulcer of left ankle limited to breakdown of skin: Secondary | ICD-10-CM | POA: Diagnosis not present

## 2020-11-08 ENCOUNTER — Encounter: Payer: Medicare HMO | Admitting: Physician Assistant

## 2020-11-14 ENCOUNTER — Other Ambulatory Visit (INDEPENDENT_AMBULATORY_CARE_PROVIDER_SITE_OTHER): Payer: Self-pay | Admitting: Nurse Practitioner

## 2020-11-14 DIAGNOSIS — I739 Peripheral vascular disease, unspecified: Secondary | ICD-10-CM

## 2020-11-14 DIAGNOSIS — I6523 Occlusion and stenosis of bilateral carotid arteries: Secondary | ICD-10-CM

## 2020-11-18 ENCOUNTER — Encounter (INDEPENDENT_AMBULATORY_CARE_PROVIDER_SITE_OTHER): Payer: Self-pay | Admitting: Vascular Surgery

## 2020-11-18 ENCOUNTER — Ambulatory Visit (INDEPENDENT_AMBULATORY_CARE_PROVIDER_SITE_OTHER): Payer: Medicare HMO

## 2020-11-18 ENCOUNTER — Ambulatory Visit (INDEPENDENT_AMBULATORY_CARE_PROVIDER_SITE_OTHER): Payer: Medicare HMO | Admitting: Vascular Surgery

## 2020-11-18 ENCOUNTER — Other Ambulatory Visit: Payer: Self-pay

## 2020-11-18 VITALS — BP 170/72 | HR 69 | Ht 64.0 in | Wt 154.0 lb

## 2020-11-18 DIAGNOSIS — I70213 Atherosclerosis of native arteries of extremities with intermittent claudication, bilateral legs: Secondary | ICD-10-CM | POA: Diagnosis not present

## 2020-11-18 DIAGNOSIS — I739 Peripheral vascular disease, unspecified: Secondary | ICD-10-CM

## 2020-11-18 DIAGNOSIS — I872 Venous insufficiency (chronic) (peripheral): Secondary | ICD-10-CM

## 2020-11-18 DIAGNOSIS — J432 Centrilobular emphysema: Secondary | ICD-10-CM | POA: Diagnosis not present

## 2020-11-18 DIAGNOSIS — I10 Essential (primary) hypertension: Secondary | ICD-10-CM | POA: Diagnosis not present

## 2020-11-18 DIAGNOSIS — I6523 Occlusion and stenosis of bilateral carotid arteries: Secondary | ICD-10-CM

## 2020-11-22 ENCOUNTER — Encounter (INDEPENDENT_AMBULATORY_CARE_PROVIDER_SITE_OTHER): Payer: Self-pay | Admitting: Vascular Surgery

## 2020-11-22 DIAGNOSIS — I70219 Atherosclerosis of native arteries of extremities with intermittent claudication, unspecified extremity: Secondary | ICD-10-CM | POA: Insufficient documentation

## 2020-11-22 NOTE — Progress Notes (Signed)
MRN : [id number]  Tracey Rogers is a 75 y.o. (February 24, 1946) female who presents with chief complaint of  Chief Complaint  Patient presents with   Follow-up    6 Mo Carotid   .  History of Present Illness:  The patient returns to the office for followup and review of the noninvasive studies. There have been no interval changes in lower extremity symptoms. No interval shortening of the patient's claudication distance or development of rest pain symptoms. No new ulcers or wounds have occurred since the last visit.   There have been no significant changes to the patient's overall health care.   The patient denies amaurosis fugax or recent TIA symptoms. There are no recent neurological changes noted. The patient denies history of DVT, PE or superficial thrombophlebitis. The patient denies recent episodes of angina or shortness of breath.   ABI Rt=0.99 and Lt=0.98  (previous ABI's Rt=0.87 and Lt+0.96) Previous duplex ultrasound of the left leg shows the SFA stent is patent with a moderate stenosis with triphasic signals at the ankle  Carotid Duplex RICA subtotal occlusion no change compared to 82/01/5620 and LICA 30-86% no changed   Current Meds  Medication Sig   acetaminophen (TYLENOL) 325 MG tablet Take 325 mg by mouth every 6 (six) hours as needed for moderate pain or headache.    alendronate (FOSAMAX) 70 MG tablet Take by mouth.   amLODipine (NORVASC) 10 MG tablet Take 1 tablet (10 mg total) by mouth daily.   aspirin EC 81 MG tablet Take 81 mg by mouth every evening.   atorvastatin (LIPITOR) 10 MG tablet Take 1 tablet (10 mg total) by mouth every evening.   clopidogrel (PLAVIX) 75 MG tablet Take 1 tablet (75 mg total) by mouth daily.   fluticasone (FLONASE) 50 MCG/ACT nasal spray Place 2 sprays into both nostrils daily. (Patient taking differently: Place 2 sprays into both nostrils daily as needed for allergies.)   Magnesium 400 MG CAPS Take 400 mg by mouth daily as needed  (cramps).    silver sulfADIAZINE (SILVADENE) 1 % cream APPLY TO LEFT HEAL DAILY AND COVER WITH LIGHT GAUZE   triamcinolone ointment (KENALOG) 0.1 % Apply 1 application topically 2 (two) times daily as needed.    Past Medical History:  Diagnosis Date   Arthritis    Complication of anesthesia    gets weak after surgery   Constipation    COPD (chronic obstructive pulmonary disease) (HCC)    mild   Family history of adverse reaction to anesthesia    son gets postop nausea and vomiting, weak   GERD (gastroesophageal reflux disease)    Heart murmur    Hypertension    Peripheral vascular disease ([place])    Psoriasis    Psoriasis    Skin ulcer of right great toe ([place])    Ulcer of left ankle ([place])    Wears dentures     Past Surgical History:  Procedure Laterality Date   APPLICATION OF A-CELL OF EXTREMITY Left 04/13/2014   Procedure: PLACEMENT OF APPLICATION OF A-CELL ;  Surgeon: Theodoro Kos, DO;  Location: [place];  Service: Plastics;  Laterality: Left;   APPLICATION OF A-CELL OF EXTREMITY Left 09/10/2014   Procedure: APPLICATION OF A-CELL OF EXTREMITY;  Surgeon: Theodoro Kos, DO;  Location: [place];  Service: Plastics;  Laterality: Left;   CARDIOVASCULAR STRESS TEST  03-03-2014  dr Saralyn Pilar   normal lexi scan sestamibi study/  normal LVF without evidence for  significant scar or ischemia   CATARACT EXTRACTION W/ INTRAOCULAR LENS  IMPLANT, BILATERAL  2015   CHOLECYSTECTOMY N/A 06/16/2015   Procedure: LAPAROSCOPIC CHOLECYSTECTOMY WITH CHOLANGIOGRAM;  Surgeon: Robert Bellow, MD;  Location: [facility];  Service: General;  Laterality: N/A;   DILATION AND CURETTAGE OF UTERUS     ENDOVASCULAR STENT INSERTION  sept  &  oct  2015   LEFT LEG STENTING--  common and superficial femoral artery   ENDOVASCULAR STENT INSERTION  May 27, 2014   LifeStent bare metal stent left SFA   HERNIA REPAIR  10/27/2015   Ventral hernia repaired with Ventrio ST mesh  above previous umbilical port site   I & D EXTREMITY Left 09/10/2014   Procedure: IRRIGATION AND DEBRIDEMENT LEFT ANKLE WOUND SURGICAL PREP ;  Surgeon: Theodoro Kos, DO;  Location: [place];  Service: Plastics;  Laterality: Left;   INCISION AND DRAINAGE OF WOUND Bilateral 04/13/2014   Procedure: IRRIGATION AND DEBRIDEMENT OF LEFT ANKLE WOUND AND RIGHT FOOT;  Surgeon: Theodoro Kos, DO;  Location: [place];  Service: Plastics;  Laterality: Bilateral;   LOWER EXTREMITY ANGIOGRAPHY Left 06/26/2017   Procedure: LOWER EXTREMITY ANGIOGRAPHY;  Surgeon: Katha Cabal, MD;  Location: [facility];  Service: Cardiovascular;  Laterality: Left;   TRANSTHORACIC ECHOCARDIOGRAM  03-03-2014   normal LVF/  ef 55-60%/  mild TI and MI   TUBAL LIGATION     VENTRAL HERNIA REPAIR N/A 10/27/2015   Procedure: HERNIA REPAIR VENTRAL ADULT;  Surgeon: Robert Bellow, MD;  Location: [facility];  Service: General;  Laterality: N/A;    Social History Social History   Tobacco Use   Smoking status: Every Day    Packs/day: 0.25    Years: 58.00    Pack years: 14.50    Types: Cigarettes   Smokeless tobacco: Never   Tobacco comments:    no smokers in her home  Vaping Use   Vaping Use: Never used  Substance Use Topics   Alcohol use: No    Alcohol/week: 0.0 standard drinks   Drug use: No    Family History Family History  Problem Relation Age of Onset   Kidney disease Mother    Hypertension Mother    Heart disease Mother    Breast cancer Maternal Aunt 22   Heart disease Sister    Stroke Sister     Allergies  Allergen Reactions   Clarithromycin Rash    "Mycin"   Methocarbamol Rash   Minocin [Minocycline Hcl] Rash   Other Rash    Optifoam applied to her left achilles wound.    Tetracycline Rash   Tetracyclines & Related Rash     REVIEW OF SYSTEMS (Negative unless checked)  Constitutional: [] Weight loss  [] Fever  [] Chills Cardiac: [] Chest pain    [] Chest pressure   [] Palpitations   [] Shortness of breath when laying flat   [] Shortness of breath with exertion. Vascular:  [x] Pain in legs with walking   [] Pain in legs at rest  [] History of DVT   [] Phlebitis   [] Swelling in legs   [] Varicose veins   [] Non-healing ulcers Pulmonary:   [] Uses home oxygen   [] Productive cough   [] Hemoptysis   [] Wheeze  [] COPD   [] Asthma Neurologic:  [] Dizziness   [] Seizures   [] History of stroke   [] History of TIA  [] Aphasia   [] Vissual changes   [] Weakness or numbness in arm   [] Weakness or numbness in leg Musculoskeletal:   [] Joint swelling   [  x]Joint pain   [x] Low back pain Hematologic:  [] Easy bruising  [] Easy bleeding   [] Hypercoagulable state   [] Anemic Gastrointestinal:  [] Diarrhea   [] Vomiting  [x] Gastroesophageal reflux/heartburn   [] Difficulty swallowing. Genitourinary:  [] Chronic kidney disease   [] Difficult urination  [] Frequent urination   [] Blood in urine Skin:  [] Rashes   [] Ulcers  Psychological:  [] History of anxiety   []  History of major depression.  Physical Examination  Vitals:   11/18/20 1414  BP: (!) 170/72  Pulse: 69  Weight: 154 lb (69.9 kg)  Height: 5\' 4"  (1.626 m)   Body mass index is 26.43 kg/m. Gen: WD/WN, NAD Head: [place]/AT, No temporalis wasting.  Ear/Nose/Throat: Hearing grossly intact, nares w/o erythema or drainage Eyes: PER, EOMI, sclera nonicteric.  Neck: Supple, no large masses.   Pulmonary:  Good air movement, no audible wheezing bilaterally, no use of accessory muscles.  Cardiac: RRR, no JVD Vascular:  bilateral carotid bruits; scattered varicosities present bilaterally.  Mild venous stasis changes to the legs bilaterally.  2+ soft pitting edema  Vessel Right Left  Radial Palpable Palpable  PT Not Palpable Not Palpable  DP Not Palpable Not Palpable  Gastrointestinal: Non-distended. No guarding/no peritoneal signs.  Musculoskeletal: M/S 5/5 throughout.  No deformity or atrophy.  Neurologic: CN 2-12 intact.  Symmetrical.  Speech is fluent. Motor exam as listed above. Psychiatric: Judgment intact, Mood & affect appropriate for pt's clinical situation. Dermatologic: Venous rashes.  No changes consistent with cellulitis. Lymph : No lichenification or skin changes of chronic lymphedema.  CBC Lab Results  Component Value Date   WBC 13.5 (H) 01/07/2020   HGB 12.9 01/07/2020   HCT 38.4 01/07/2020   MCV 92 01/07/2020   PLT 251 01/07/2020    BMET    Component Value Date/Time   NA 135 01/07/2020 1132   NA 128 (L) 05/27/2014 1253   K 4.4 01/07/2020 1132   K 4.2 05/27/2014 1253   CL 100 01/07/2020 1132   CL 97 (L) 05/27/2014 1253   CO2 20 01/07/2020 1132   CO2 25 05/27/2014 1253   GLUCOSE 78 01/07/2020 1132   GLUCOSE 111 (H) 02/28/2017 1105   GLUCOSE 84 05/27/2014 1253   BUN 9 01/07/2020 1132   BUN 9 05/27/2014 1253   CREATININE 0.93 01/07/2020 1132   CREATININE 0.82 02/28/2017 1105   CALCIUM 9.4 01/07/2020 1132   CALCIUM 9.1 05/27/2014 1253   GFRNONAA 61 01/07/2020 1132   GFRNONAA 72 02/28/2017 1105   GFRAA 71 01/07/2020 1132   GFRAA 84 02/28/2017 1105   CrCl cannot be calculated (Patient's most recent lab result is older than the maximum 21 days allowed.).  COAG Lab Results  Component Value Date   INR 0.9 03/18/2014    Radiology VAS [place] ABI WITH/WO TBI  Result Date: 11/18/2020  LOWER EXTREMITY DOPPLER STUDY Patient Name:  DANIYLA PFAHLER  Date of Exam:   11/18/2020 Medical Rec #: [id number]            Accession #:    [id number] Date of Birth: 1945/07/14            Patient Gender: F Patient Age:   22Y Exam Location:  [facility] Procedure:      VAS [place] ABI WITH/WO TBI Referring Phys: [id number] [place] --------------------------------------------------------------------------------  Indications: Ulceration, and peripheral artery disease.  Vascular Interventions: Multiple interventions of bilateral lower extremities. Comparison Study: 05/20/2020 Performing  Technologist: Almira Coaster RVS  Examination Guidelines: A complete evaluation includes at  minimum, Doppler waveform signals and systolic blood pressure reading at the level of bilateral brachial, anterior tibial, and posterior tibial arteries, when vessel segments are accessible. Bilateral testing is considered an integral part of a complete examination. Photoelectric Plethysmograph (PPG) waveforms and toe systolic pressure readings are included as required and additional duplex testing as needed. Limited examinations for reoccurring indications may be performed as noted.  ABI Findings: +---------+------------------+-----+---------+--------+ Right    Rt Pressure (mmHg)IndexWaveform Comment  +---------+------------------+-----+---------+--------+ Brachial 178                                      +---------+------------------+-----+---------+--------+ ATA      172               0.97 triphasic         +---------+------------------+-----+---------+--------+ PTA      177               0.99 triphasic         +---------+------------------+-----+---------+--------+ Great Toe163               0.92 Normal            +---------+------------------+-----+---------+--------+ +---------+------------------+-----+---------+-------+ Left     Lt Pressure (mmHg)IndexWaveform Comment +---------+------------------+-----+---------+-------+ Brachial 98                                      +---------+------------------+-----+---------+-------+ ATA      174               0.98 triphasic        +---------+------------------+-----+---------+-------+ PTA      152               0.85 biphasic         +---------+------------------+-----+---------+-------+ Great Toe158               0.89 Normal           +---------+------------------+-----+---------+-------+ +-------+-----------+-----------+------------+------------+ ABI/TBIToday's ABIToday's TBIPrevious ABIPrevious TBI  +-------+-----------+-----------+------------+------------+ Right  .99        .92        .87         .63          +-------+-----------+-----------+------------+------------+ Left   .98        .89        .96         .74          +-------+-----------+-----------+------------+------------+ Right ABIs appear increased compared to prior study on 05/20/2020. Bilateral TBIs appear increased compared to prior study on 05/20/2020. Lt TBIs appear essetially unchanged compared to prior study on 05/20/2020.  Summary: Right: Resting right ankle-brachial index is within normal range. No evidence of significant right lower extremity arterial disease. The right toe-brachial index is normal. Left: Resting left ankle-brachial index is within normal range. No evidence of significant left lower extremity arterial disease. The left toe-brachial index is normal.  *See table(s) above for measurements and observations.  Electronically signed by Hortencia Pilar MD on 11/18/2020 at 5:14:33 PM.    Final    VAS US CAROTID  Result Date: 11/18/2020 Carotid Arterial Duplex Study Patient Name:  DORANN DAVIDSON  Date of Exam:   11/18/2020 Medical Rec #: [id number]            Accession #:    [id number] Date of Birth: 1945/11/17  Patient Gender: F Patient Age:   19Y Exam Location:  [facility] Procedure:      VAS US CAROTID Referring Phys: [id number] [place] --------------------------------------------------------------------------------  Indications:       Carotid artery disease and Carotid stenosis. Comparison Study:  04/29/2018 Performing Technologist: Almira Coaster RVS  Examination Guidelines: A complete evaluation includes B-mode imaging, spectral Doppler, color Doppler, and power Doppler as needed of all accessible portions of each vessel. Bilateral testing is considered an integral part of a complete examination. Limited examinations for reoccurring indications may be performed as noted.  Right  Carotid Findings: +----------+-------+--------+--------+--------------------------------+--------+           PSV    EDV cm/sStenosisPlaque Description              Comments           cm/s                                                            +----------+-------+--------+--------+--------------------------------+--------+ CCA Prox  73     0                                                        +----------+-------+--------+--------+--------------------------------+--------+ CCA Mid   135    0               homogeneous, irregular and                                                calcific                                 +----------+-------+--------+--------+--------------------------------+--------+ CCA Distal33     0                                                        +----------+-------+--------+--------+--------------------------------+--------+ ICA Prox  97     0                                                        +----------+-------+--------+--------+--------------------------------+--------+ ICA Mid   59     0                                                        +----------+-------+--------+--------+--------------------------------+--------+ ICA Distal24     0                                                        +----------+-------+--------+--------+--------------------------------+--------+  ECA       92     10                                                       +----------+-------+--------+--------+--------------------------------+--------+ +----------+--------+-------+--------+-------------------+           PSV cm/sEDV cmsDescribeArm Pressure (mmHG) +----------+--------+-------+--------+-------------------+ Subclavian112     0                                  +----------+--------+-------+--------+-------------------+ +---------+--------+--+--------+--+ VertebralPSV cm/s73EDV cm/s16  +---------+--------+--+--------+--+  Left Carotid Findings: +----------+-------+--------+--------+--------------------------------+--------+           PSV    EDV cm/sStenosisPlaque Description              Comments           cm/s                                                            +----------+-------+--------+--------+--------------------------------+--------+ CCA Prox  158    29                                                       +----------+-------+--------+--------+--------------------------------+--------+ CCA Mid   162    32              homogeneous, irregular and                                                calcific                                 +----------+-------+--------+--------+--------------------------------+--------+ CCA Distal151    29                                                       +----------+-------+--------+--------+--------------------------------+--------+ ICA Prox  295    35                                                       +----------+-------+--------+--------+--------------------------------+--------+ ICA Mid   150    24                                                       +----------+-------+--------+--------+--------------------------------+--------+ ICA [id number]  27                                                       +----------+-------+--------+--------+--------------------------------+--------+ ECA       391    45                                                       +----------+-------+--------+--------+--------------------------------+--------+ +----------+--------+--------+--------+-------------------+           PSV cm/sEDV cm/sDescribeArm Pressure (mmHG) +----------+--------+--------+--------+-------------------+ [id number]      6                                   +----------+--------+--------+--------+-------------------+ +---------+--------+--+--------+--+ VertebralPSV  cm/s78EDV cm/s14 +---------+--------+--+--------+--+   Summary: Right Carotid: Velocities and Waveform patterns suggest a Pre-Occlusive state                80-99% stenosis in the Right ICA; No change from prior study on                04/29/2018. Left Carotid: Velocities suggest 60-79% stenosis in the Left ICA; No change from               prior study on 04/29/2018. Vertebrals:  Bilateral vertebral arteries demonstrate antegrade flow. Subclavians: Normal flow hemodynamics were seen in bilateral subclavian              arteries. *See table(s) above for measurements and observations.  Electronically signed by Hortencia Pilar MD on 11/18/2020 at 5:14:26 PM.    Final      Assessment/Plan 1. Atherosclerosis of native artery of both lower extremities with intermittent claudication (HCC) Recommend:   The patient has evidence of atherosclerosis of the lower extremities with claudication.  The patient does not voice lifestyle limiting changes at this point in time.     Noninvasive studies do not suggest clinically significant change. She appears to have good perfusion for wound healing.   No invasive studies, angiography or surgery at this time The patient should continue walking and begin a more formal exercise program. The patient should continue antiplatelet therapy and aggressive treatment of the lipid abnormalities   No changes in the patient's medications at this time   The patient should continue wearing graduated compression socks 10-15 mmHg strength to control the mild edema.   - VAS [place] LOWER EXTREMITY ARTERIAL DUPLEX; Future - VAS [place] ABI WITH/WO TBI; Future  2. Bilateral carotid artery stenosis Recommend:   Given the patient's asymptomatic carotid stenosis is unchanged compared to two years ago, no further invasive testing or surgery at this time.  She is not interested in in repair.   Continue antiplatelet therapy as prescribed Continue management of CAD, HTN and  Hyperlipidemia Healthy heart diet,  encouraged exercise at least 4 times per week Follow up in 12 months with duplex  3. Chronic venous insufficiency No surgery or intervention at this point in time.     I have had a long discussion with the patient regarding venous insufficiency and why it  causes symptoms. I have discussed with the patient the chronic skin changes that  accompany venous insufficiency and the long term sequela such as infection and ulceration.  Patient will begin wearing graduated compression stockings class 1 (20-30 mmHg) or compression wraps on a daily basis a prescription was given. The patient will put the stockings on first thing in the morning and removing them in the evening. The patient is instructed specifically not to sleep in the stockings.     In addition, behavioral modification including several periods of elevation of the lower extremities during the day will be continued. I have demonstrated that proper elevation is a position with the ankles at heart level.   The patient is instructed to begin routine exercise, especially walking on a daily basis  4. Essential hypertension Continue antihypertensive medications as already ordered, these medications have been reviewed and there are no changes at this time.   5. Centrilobular emphysema ([place]) Continue pulmonary medications and aerosols as already ordered, these medications have been reviewed and there are no changes at this time.      Hortencia Pilar, MD  11/22/2020 12:34 PM

## 2020-12-15 DIAGNOSIS — L97321 Non-pressure chronic ulcer of left ankle limited to breakdown of skin: Secondary | ICD-10-CM | POA: Diagnosis not present

## 2020-12-15 DIAGNOSIS — I739 Peripheral vascular disease, unspecified: Secondary | ICD-10-CM | POA: Diagnosis not present

## 2021-02-02 DIAGNOSIS — B351 Tinea unguium: Secondary | ICD-10-CM | POA: Diagnosis not present

## 2021-02-02 DIAGNOSIS — M79675 Pain in left toe(s): Secondary | ICD-10-CM | POA: Diagnosis not present

## 2021-02-02 DIAGNOSIS — L97321 Non-pressure chronic ulcer of left ankle limited to breakdown of skin: Secondary | ICD-10-CM | POA: Diagnosis not present

## 2021-02-02 DIAGNOSIS — M79674 Pain in right toe(s): Secondary | ICD-10-CM | POA: Diagnosis not present

## 2021-02-07 ENCOUNTER — Other Ambulatory Visit: Payer: Self-pay | Admitting: Physician Assistant

## 2021-02-07 DIAGNOSIS — I7090 Unspecified atherosclerosis: Secondary | ICD-10-CM

## 2021-02-07 DIAGNOSIS — E7800 Pure hypercholesterolemia, unspecified: Secondary | ICD-10-CM

## 2021-02-07 DIAGNOSIS — I10 Essential (primary) hypertension: Secondary | ICD-10-CM

## 2021-02-08 ENCOUNTER — Other Ambulatory Visit: Payer: Self-pay | Admitting: Internal Medicine

## 2021-02-08 DIAGNOSIS — Z1231 Encounter for screening mammogram for malignant neoplasm of breast: Secondary | ICD-10-CM

## 2021-02-09 ENCOUNTER — Other Ambulatory Visit: Payer: Self-pay | Admitting: Family Medicine

## 2021-02-09 DIAGNOSIS — I10 Essential (primary) hypertension: Secondary | ICD-10-CM

## 2021-02-09 DIAGNOSIS — I7090 Unspecified atherosclerosis: Secondary | ICD-10-CM

## 2021-02-09 DIAGNOSIS — E7800 Pure hypercholesterolemia, unspecified: Secondary | ICD-10-CM

## 2021-02-09 NOTE — Telephone Encounter (Signed)
Requested medication (s) are due for refill today: Yes  Requested medication (s) are on the active medication list:Yes  Last refill:  12/25/19  Future visit scheduled: No  Notes to clinic:  Unable to refill per protocol, last refill by another provider. Patient has upcoming appointment with a new provider on 03/02/21, requesting enough refills until that appointment, see TE note below.      Requested Prescriptions  Pending Prescriptions Disp Refills   amLODipine (NORVASC) 10 MG tablet 90 tablet 3    Sig: Take 1 tablet (10 mg total) by mouth daily.     Cardiovascular:  Calcium Channel Blockers Failed - 02/09/2021 12:14 PM      Failed - Last BP in normal range    BP Readings from Last 1 Encounters:  11/18/20 (!) 170/72          Failed - Valid encounter within last 6 months    Recent Outpatient Visits           9 months ago Centrilobular emphysema [facility])   [facility] [place], Clearnce Sorrel, [place]   9 months ago Patient left before evaluation by physician   [facility] Mar D[place], [place]   1 year ago Essential hypertension   [facility] Fenton Malling M, [place]   2 years ago Situational anxiety   [place], [place], [place]   3 years ago Atherosclerosis   [facility] Fenton Malling M, PA-C               atorvastatin (LIPITOR) 10 MG tablet 90 tablet 3    Sig: Take 1 tablet (10 mg total) by mouth every evening.     Cardiovascular:  Antilipid - Statins Failed - 02/09/2021 12:14 PM      Failed - Total Cholesterol in normal range and within 360 days    Cholesterol, Total  Date Value Ref Range Status  01/07/2020 146 100 - 199 mg/dL Final          Failed - LDL in normal range and within 360 days    LDL Cholesterol (Calc)  Date Value Ref Range Status  02/28/2017 149 (H) mg/dL (calc) Final    Comment:    Reference range: <100 . Desirable range <100 mg/dL for primary  prevention;   <70 mg/dL for patients with CHD or diabetic patients  with > or = 2 CHD risk factors. [place] Kitchen LDL-C is now calculated using the Martin-Hopkins  calculation, which is a validated novel method providing  better accuracy than the Friedewald equation in the  estimation of LDL-C.  Cresenciano Genre et al. Annamaria Helling. [id number]): 2061-2068  (http://education.[employer].com/faq/FAQ164)    LDL Chol Calc (NIH)  Date Value Ref Range Status  01/07/2020 92 0 - 99 mg/dL Final          Failed - HDL in normal range and within 360 days    HDL  Date Value Ref Range Status  01/07/2020 40 >39 mg/dL Final          Failed - Triglycerides in normal range and within 360 days    Triglycerides  Date Value Ref Range Status  01/07/2020 72 0 - 149 mg/dL Final          Passed - Patient is not pregnant      Passed - Valid encounter within last 12 months    Recent Outpatient Visits           9 months ago Centrilobular emphysema ([place])  [place], [place]   9 months ago Patient left before evaluation by physician   [place], Clearnce Sorrel, [place]   1 year ago Essential hypertension   [place], Clearnce Sorrel, [place]   2 years ago Situational anxiety   [place], [place], [place]   3 years ago Atherosclerosis   [facility] Fenton Malling M, [place]               clopidogrel (PLAVIX) 75 MG tablet 90 tablet 3    Sig: Take 1 tablet (75 mg total) by mouth daily.     Hematology: Antiplatelets - clopidogrel Failed - 02/09/2021 12:14 PM      Failed - Evaluate AST, ALT within 2 months of therapy initiation.      Failed - ALT in normal range and within 360 days    ALT  Date Value Ref Range Status  01/07/2020 17 0 - 32 IU/L Final          Failed - AST in normal range and within 360 days    AST  Date Value Ref Range Status  01/07/2020 18 0 - 40 IU/L Final           Failed - HCT in normal range and within 180 days    Hematocrit  Date Value Ref Range Status  01/07/2020 38.4 34.0 - 46.6 % Final          Failed - HGB in normal range and within 180 days    Hemoglobin  Date Value Ref Range Status  01/07/2020 12.9 11.1 - 15.9 g/dL Final          Failed - PLT in normal range and within 180 days    Platelets  Date Value Ref Range Status  01/07/2020 251 150 - 450 x10E3/uL Final          Failed - Valid encounter within last 6 months    Recent Outpatient Visits           9 months ago Centrilobular emphysema [facility])   [place], Clearnce Sorrel, [place]   9 months ago Patient left before evaluation by physician   [place], Clearnce Sorrel, [place]   1 year ago Essential hypertension   [place], Clearnce Sorrel, [place]   2 years ago Situational anxiety   [place], Clearnce Sorrel, [place]   3 years ago [place], [place], [place]

## 2021-02-09 NOTE — Telephone Encounter (Signed)
Copied from [place] [id number]. Topic: Quick Communication - Rx Refill/Question >> Feb 09, 2021 11:22 AM Leward Quan A wrote: Medication: atorvastatin (LIPITOR) 10 MG tablet, amLODipine (NORVASC) 10 MG tablet, clopidogrel (PLAVIX) 75 MG tablet  Have contacted a new PCP but wont be seen till 03/02/21 Has the patient contacted their pharmacy? Yes.   (Agent: If no, request that the patient contact the pharmacy for the refill.) (Agent: If yes, when and what did the pharmacy advise?)  Preferred Pharmacy (with phone number or street name): [place], [place]  Phone:  [phone] Fax:  [phone]     Agent: Please be advised that RX refills may take up to 3 business days. We ask that you follow-up with your pharmacy.

## 2021-02-10 MED ORDER — CLOPIDOGREL BISULFATE 75 MG PO TABS: 75.0000 mg | ORAL_TABLET | Freq: Every day | ORAL | 0 refills | Status: AC

## 2021-02-10 MED ORDER — ATORVASTATIN CALCIUM 10 MG PO TABS: 10.0000 mg | ORAL_TABLET | Freq: Every evening | ORAL | 0 refills | Status: AC

## 2021-02-10 MED ORDER — AMLODIPINE BESYLATE 10 MG PO TABS: 10.0000 mg | ORAL_TABLET | Freq: Every day | ORAL | 0 refills | Status: AC

## 2021-02-11 ENCOUNTER — Telehealth: Payer: Self-pay

## 2021-02-11 NOTE — Telephone Encounter (Signed)
Mrs. Tracey Rogers advised that we do not email any forms. We did agree to mailing form so that Mrs. Tracey Rogers could sign at home due to poor mobility. Mrs. Tracey Rogers agreed to bringing back form to [facility] once it was signed.

## 2021-02-11 NOTE — Telephone Encounter (Signed)
Copied from [place] [id number]. Topic: General - Other >> Feb 11, 2021  1:03 PM Celene Kras wrote: Reason for CRM: Pts daughter, Lorriane Shire, calling stating that she is needing to have a copy of the medical release form sent to her email. Please advise.

## 2021-03-07 DIAGNOSIS — R739 Hyperglycemia, unspecified: Secondary | ICD-10-CM | POA: Diagnosis not present

## 2021-03-07 DIAGNOSIS — Z1389 Encounter for screening for other disorder: Secondary | ICD-10-CM | POA: Diagnosis not present

## 2021-03-07 DIAGNOSIS — I10 Essential (primary) hypertension: Secondary | ICD-10-CM | POA: Diagnosis not present

## 2021-03-07 DIAGNOSIS — Z0000 Encounter for general adult medical examination without abnormal findings: Secondary | ICD-10-CM | POA: Diagnosis not present

## 2021-03-07 DIAGNOSIS — I70243 Atherosclerosis of native arteries of left leg with ulceration of ankle: Secondary | ICD-10-CM | POA: Diagnosis not present

## 2021-03-07 DIAGNOSIS — G458 Other transient cerebral ischemic attacks and related syndromes: Secondary | ICD-10-CM | POA: Diagnosis not present

## 2021-03-07 DIAGNOSIS — Z7689 Persons encountering health services in other specified circumstances: Secondary | ICD-10-CM | POA: Diagnosis not present

## 2021-03-07 DIAGNOSIS — M810 Age-related osteoporosis without current pathological fracture: Secondary | ICD-10-CM | POA: Diagnosis not present

## 2021-03-07 DIAGNOSIS — Z780 Asymptomatic menopausal state: Secondary | ICD-10-CM | POA: Diagnosis not present

## 2021-03-07 DIAGNOSIS — J439 Emphysema, unspecified: Secondary | ICD-10-CM | POA: Diagnosis not present

## 2021-03-07 DIAGNOSIS — Z1159 Encounter for screening for other viral diseases: Secondary | ICD-10-CM | POA: Diagnosis not present

## 2021-03-16 ENCOUNTER — Other Ambulatory Visit: Payer: Self-pay

## 2021-03-16 ENCOUNTER — Ambulatory Visit
Admission: RE | Admit: 2021-03-16 | Discharge: 2021-03-16 | Disposition: A | Discharge status: Home / Self Care | Payer: Medicare HMO | Source: Ambulatory Visit | Attending: Internal Medicine | Admitting: Internal Medicine

## 2021-03-16 DIAGNOSIS — Z1231 Encounter for screening mammogram for malignant neoplasm of breast: Secondary | ICD-10-CM | POA: Insufficient documentation

## 2021-03-30 DIAGNOSIS — L97321 Non-pressure chronic ulcer of left ankle limited to breakdown of skin: Secondary | ICD-10-CM | POA: Diagnosis not present

## 2021-03-30 DIAGNOSIS — I739 Peripheral vascular disease, unspecified: Secondary | ICD-10-CM | POA: Diagnosis not present

## 2021-05-10 ENCOUNTER — Encounter (INDEPENDENT_AMBULATORY_CARE_PROVIDER_SITE_OTHER): Payer: Self-pay | Admitting: Vascular Surgery

## 2021-05-10 NOTE — Telephone Encounter (Signed)
Appointment is reschedule

## 2021-05-12 ENCOUNTER — Encounter (INDEPENDENT_AMBULATORY_CARE_PROVIDER_SITE_OTHER): Payer: Medicare HMO

## 2021-05-12 ENCOUNTER — Ambulatory Visit (INDEPENDENT_AMBULATORY_CARE_PROVIDER_SITE_OTHER): Payer: Medicare HMO | Admitting: Vascular Surgery

## 2021-05-29 DIAGNOSIS — J449 Chronic obstructive pulmonary disease, unspecified: Secondary | ICD-10-CM | POA: Diagnosis not present

## 2021-05-29 DIAGNOSIS — Z7982 Long term (current) use of aspirin: Secondary | ICD-10-CM | POA: Diagnosis not present

## 2021-05-29 DIAGNOSIS — Z79899 Other long term (current) drug therapy: Secondary | ICD-10-CM | POA: Diagnosis not present

## 2021-05-29 DIAGNOSIS — K5900 Constipation, unspecified: Secondary | ICD-10-CM | POA: Diagnosis not present

## 2021-05-29 DIAGNOSIS — I10 Essential (primary) hypertension: Secondary | ICD-10-CM | POA: Diagnosis not present

## 2021-05-29 DIAGNOSIS — I739 Peripheral vascular disease, unspecified: Secondary | ICD-10-CM | POA: Diagnosis not present

## 2021-05-29 DIAGNOSIS — Z7902 Long term (current) use of antithrombotics/antiplatelets: Secondary | ICD-10-CM | POA: Diagnosis not present

## 2021-06-08 DIAGNOSIS — L97321 Non-pressure chronic ulcer of left ankle limited to breakdown of skin: Secondary | ICD-10-CM | POA: Diagnosis not present

## 2021-06-08 DIAGNOSIS — I739 Peripheral vascular disease, unspecified: Secondary | ICD-10-CM | POA: Diagnosis not present

## 2021-06-08 DIAGNOSIS — M79674 Pain in right toe(s): Secondary | ICD-10-CM | POA: Diagnosis not present

## 2021-06-08 DIAGNOSIS — B351 Tinea unguium: Secondary | ICD-10-CM | POA: Diagnosis not present

## 2021-06-08 DIAGNOSIS — M79675 Pain in left toe(s): Secondary | ICD-10-CM | POA: Diagnosis not present

## 2021-06-17 ENCOUNTER — Other Ambulatory Visit (INDEPENDENT_AMBULATORY_CARE_PROVIDER_SITE_OTHER): Payer: Self-pay | Admitting: Vascular Surgery

## 2021-06-17 DIAGNOSIS — I739 Peripheral vascular disease, unspecified: Secondary | ICD-10-CM

## 2021-06-17 DIAGNOSIS — Z9889 Other specified postprocedural states: Secondary | ICD-10-CM

## 2021-06-19 NOTE — Progress Notes (Signed)
MRN : [id number]  Tracey Rogers is a 76 y.o. ([birth year]) female who presents with chief complaint of check circulation.  History of Present Illness:   The patient returns to the office for followup and review of the noninvasive studies. There have been no interval changes in lower extremity symptoms. No interval shortening of the patient's claudication distance or development of rest pain symptoms. No new ulcers or wounds have occurred since the last visit.  The patient is also followed for carotid stenosis. The carotid stenosis followed by ultrasound.   The patient denies amaurosis fugax. There is no recent history of TIA symptoms or focal motor deficits. There is no prior documented CVA.  The patient is taking enteric-coated aspirin 81 mg daily.  There have been no significant changes to the patient's overall health care.   The patient denies history of DVT, PE or superficial thrombophlebitis. The patient denies recent episodes of angina or shortness of breath.   ABI Rt=0.99 and Lt=1.13  (previous ABI's Rt=0.99 and Lt=0.98) Duplex ultrasound  today of the left leg shows the SFA stent is patent with a moderate stenosis with triphasic signals at the ankle no change compared to previous study   Previous carotid Duplex RICA subtotal occlusion no change compared to 33/06/9516 and LICA 84-16% no changed  No outpatient medications have been marked as taking for the 06/20/21 encounter (Appointment) with Delana Meyer, Dolores Lory, MD.    Past Medical History:  Diagnosis Date   Arthritis    Complication of anesthesia    gets weak after surgery   Constipation    COPD (chronic obstructive pulmonary disease) ([place])    mild   Family history of adverse reaction to anesthesia    son gets postop nausea and vomiting, weak   GERD (gastroesophageal reflux disease)    Heart murmur    Hypertension    Peripheral vascular disease ([place])    Psoriasis    Psoriasis    Skin ulcer of right great toe  ([place])    Ulcer of left ankle ([place])    Wears dentures     Past Surgical History:  Procedure Laterality Date   APPLICATION OF A-CELL OF EXTREMITY Left 04/13/2014   Procedure: PLACEMENT OF APPLICATION OF A-CELL ;  Surgeon: Theodoro Kos, DO;  Location: [place] [place];  Service: Plastics;  Laterality: Left;   APPLICATION OF A-CELL OF EXTREMITY Left 09/10/2014   Procedure: APPLICATION OF A-CELL OF EXTREMITY;  Surgeon: Theodoro Kos, DO;  Location: [place];  Service: Plastics;  Laterality: Left;   CARDIOVASCULAR STRESS TEST  03-03-2014  dr Saralyn Pilar   normal lexi scan sestamibi study/  normal LVF without evidence for significant scar or ischemia   CATARACT EXTRACTION W/ INTRAOCULAR LENS  IMPLANT, BILATERAL  2015   CHOLECYSTECTOMY N/A 06/16/2015   Procedure: LAPAROSCOPIC CHOLECYSTECTOMY WITH CHOLANGIOGRAM;  Surgeon: Robert Bellow, MD;  Location: [facility];  Service: General;  Laterality: N/A;   DILATION AND CURETTAGE OF UTERUS     ENDOVASCULAR STENT INSERTION  sept  &  oct  2015   LEFT LEG STENTING--  common and superficial femoral artery   ENDOVASCULAR STENT INSERTION  May 27, 2014   LifeStent bare metal stent left SFA   HERNIA REPAIR  10/27/2015   Ventral hernia repaired with [place]rio ST mesh above previous umbilical port site   I & D EXTREMITY Left 09/10/2014   Procedure: IRRIGATION AND DEBRIDEMENT LEFT ANKLE WOUND SURGICAL PREP ;  Surgeon: Theodoro Kos, DO;  Location: [place];  Service: Plastics;  Laterality: Left;   INCISION AND DRAINAGE OF WOUND Bilateral 04/13/2014   Procedure: IRRIGATION AND DEBRIDEMENT OF LEFT ANKLE WOUND AND RIGHT FOOT;  Surgeon: Theodoro Kos, DO;  Location: [place];  Service: Plastics;  Laterality: Bilateral;   LOWER EXTREMITY ANGIOGRAPHY Left 06/26/2017   Procedure: LOWER EXTREMITY ANGIOGRAPHY;  Surgeon: Katha Cabal, MD;  Location: [facility];  Service: Cardiovascular;   Laterality: Left;   TRANSTHORACIC ECHOCARDIOGRAM  03-03-2014   normal LVF/  ef 55-60%/  mild TI and MI   TUBAL LIGATION     VENTRAL HERNIA REPAIR N/A 10/27/2015   Procedure: HERNIA REPAIR VENTRAL ADULT;  Surgeon: Robert Bellow, MD;  Location: [facility];  Service: General;  Laterality: N/A;    Social History Social History   Tobacco Use   Smoking status: Every Day    Packs/day: 0.25    Years: 58.00    Pack years: 14.50    Types: Cigarettes   Smokeless tobacco: Never   Tobacco comments:    no smokers in her home  Vaping Use   Vaping Use: Never used  Substance Use Topics   Alcohol use: No    Alcohol/week: 0.0 standard drinks   Drug use: No    Family History Family History  Problem Relation Age of Onset   Kidney disease Mother    Hypertension Mother    Heart disease Mother    Breast cancer Maternal Aunt 78   Heart disease Sister    Stroke Sister     Allergies  Allergen Reactions   Clarithromycin Rash    "Mycin"   Methocarbamol Rash   Minocin [Minocycline Hcl] Rash   Other Rash    Optifoam applied to her left achilles wound.    Tetracycline Rash   Tetracyclines & Related Rash     REVIEW OF SYSTEMS (Negative unless checked)  Constitutional: [] Weight loss  [] Fever  [] Chills Cardiac: [] Chest pain   [] Chest pressure   [] Palpitations   [] Shortness of breath when laying flat   [] Shortness of breath with exertion. Vascular:  [x] Pain in legs with walking   [] Pain in legs at rest  [] History of DVT   [] Phlebitis   [x] Swelling in legs   [] Varicose veins   [] Non-healing ulcers Pulmonary:   [] Uses home oxygen   [] Productive cough   [] Hemoptysis   [] Wheeze  [x] COPD   [] Asthma Neurologic:  [] Dizziness   [] Seizures   [] History of stroke   [] History of TIA  [] Aphasia   [] Vissual changes   [] Weakness or numbness in arm   [] Weakness or numbness in leg Musculoskeletal:   [] Joint swelling   [x] Joint pain   [] Low back pain Hematologic:  [] Easy bruising  [] Easy bleeding    [] Hypercoagulable state   [] Anemic Gastrointestinal:  [] Diarrhea   [] Vomiting  [] Gastroesophageal reflux/heartburn   [] Difficulty swallowing. Genitourinary:  [] Chronic kidney disease   [] Difficult urination  [] Frequent urination   [] Blood in urine Skin:  [] Rashes   [] Ulcers  Psychological:  [] History of anxiety   []  History of major depression.  Physical Examination  There were no vitals filed for this visit. There is no height or weight on file to calculate BMI. Gen: WD/WN, NAD Head: [place]/AT, No temporalis wasting.  Ear/Nose/Throat: Hearing grossly intact, nares w/o erythema or drainage Eyes: PER, EOMI, sclera nonicteric.  Neck: Supple, no masses.  No bruit or JVD.  Pulmonary:  Good air movement, no audible wheezing, no use of accessory  muscles.  Cardiac: RRR, normal S1, S2, no Murmurs. Vascular:   Vessel Right Left  Radial Palpable Palpable  Carotid Palpable Palpable  PT Palpable Palpable  DP Palpable Palpable  Gastrointestinal: soft, non-distended. No guarding/no peritoneal signs.  Musculoskeletal: M/S 5/5 throughout.  No visible deformity.  Neurologic: CN 2-12 intact. Pain and light touch intact in extremities.  Symmetrical.  Speech is fluent. Motor exam as listed above. Psychiatric: Judgment intact, Mood & affect appropriate for pt's clinical situation. Dermatologic: No rashes or ulcers noted.  No changes consistent with cellulitis.   CBC Lab Results  Component Value Date   WBC 13.5 (H) 01/07/2020   HGB 12.9 01/07/2020   HCT 38.4 01/07/2020   MCV 92 01/07/2020   PLT 251 01/07/2020    BMET    Component Value Date/Time   NA 135 01/07/2020 1132   NA 128 (L) 05/27/2014 1253   K 4.4 01/07/2020 1132   K 4.2 05/27/2014 1253   CL 100 01/07/2020 1132   CL 97 (L) 05/27/2014 1253   CO2 20 01/07/2020 1132   CO2 25 05/27/2014 1253   GLUCOSE 78 01/07/2020 1132   GLUCOSE 111 (H) 02/28/2017 1105   GLUCOSE 84 05/27/2014 1253   BUN 9 01/07/2020 1132   BUN 9 05/27/2014 1253    CREATININE 0.93 01/07/2020 1132   CREATININE 0.82 02/28/2017 1105   CALCIUM 9.4 01/07/2020 1132   CALCIUM 9.1 05/27/2014 1253   GFRNONAA 61 01/07/2020 1132   GFRNONAA 72 02/28/2017 1105   GFRAA 71 01/07/2020 1132   GFRAA 84 02/28/2017 1105   CrCl cannot be calculated (Patient's most recent lab result is older than the maximum 21 days allowed.).  COAG Lab Results  Component Value Date   INR 0.9 03/18/2014    Radiology No results found.   Assessment/Plan 1. Atherosclerosis of native artery of both lower extremities with intermittent claudication (HCC) Recommend:   The patient has evidence of atherosclerosis of the lower extremities with claudication.  The patient does not voice lifestyle limiting changes at this point in time.     Noninvasive studies do not suggest clinically significant change. She appears to have good perfusion for wound healing.   No invasive studies, angiography or surgery at this time The patient should continue walking and begin a more formal exercise program. The patient should continue antiplatelet therapy and aggressive treatment of the lipid abnormalities   No changes in the patient's medications at this time   The patient should continue wearing graduated compression socks 10-15 mmHg strength to control the mild edema.  - VAS [place] ABI WITH/WO TBI; Future  2. Bilateral carotid artery stenosis Recommend:   Given the patient's asymptomatic carotid stenosis is unchanged compared to two years ago, no further invasive testing or surgery at this time.  She is not interested in in repair.   Continue antiplatelet therapy as prescribed Continue management of CAD, HTN and Hyperlipidemia Healthy heart diet,  encouraged exercise at least 4 times per week Follow up in 12 months with duplex - VAS US CAROTID; Future  3. Chronic venous insufficiency No surgery or intervention at this point in time.     I have reviewed with the patient venous insufficiency  and why it  causes symptoms. I have discussed with the patient the chronic skin changes that accompany venous insufficiency and the long term sequela such as infection and ulceration.  Patient will continue wearing graduated compression stockings on a daily basis a prescription was given. The patient will  put the stockings on first thing in the morning and removing them in the evening. The patient is instructed specifically not to sleep in the stockings.     In addition, behavioral modification including several periods of elevation of the lower extremities during the day will be continued. I have demonstrated that proper elevation is a position with the ankles at heart level.   The patient is instructed to begin routine exercise, especially walking on a daily basis  4. Essential hypertension Continue antihypertensive medications as already ordered, these medications have been reviewed and there are no changes at this time.   5. Centrilobular emphysema ([place]) Continue pulmonary medications and aerosols as already ordered, these medications have been reviewed and there are no changes at this time.      Hortencia Pilar, MD  06/19/2021 2:52 PM

## 2021-06-20 ENCOUNTER — Ambulatory Visit (INDEPENDENT_AMBULATORY_CARE_PROVIDER_SITE_OTHER): Payer: Medicare HMO

## 2021-06-20 ENCOUNTER — Ambulatory Visit (INDEPENDENT_AMBULATORY_CARE_PROVIDER_SITE_OTHER): Payer: Medicare HMO | Admitting: Vascular Surgery

## 2021-06-20 ENCOUNTER — Other Ambulatory Visit: Payer: Self-pay

## 2021-06-20 ENCOUNTER — Encounter (INDEPENDENT_AMBULATORY_CARE_PROVIDER_SITE_OTHER): Payer: Self-pay | Admitting: Vascular Surgery

## 2021-06-20 VITALS — BP 167/72 | HR 70 | Resp 16 | Wt 146.6 lb

## 2021-06-20 DIAGNOSIS — J432 Centrilobular emphysema: Secondary | ICD-10-CM | POA: Diagnosis not present

## 2021-06-20 DIAGNOSIS — Z9889 Other specified postprocedural states: Secondary | ICD-10-CM

## 2021-06-20 DIAGNOSIS — I6523 Occlusion and stenosis of bilateral carotid arteries: Secondary | ICD-10-CM

## 2021-06-20 DIAGNOSIS — I70213 Atherosclerosis of native arteries of extremities with intermittent claudication, bilateral legs: Secondary | ICD-10-CM | POA: Diagnosis not present

## 2021-06-20 DIAGNOSIS — I739 Peripheral vascular disease, unspecified: Secondary | ICD-10-CM | POA: Diagnosis not present

## 2021-06-20 DIAGNOSIS — I10 Essential (primary) hypertension: Secondary | ICD-10-CM | POA: Diagnosis not present

## 2021-06-20 DIAGNOSIS — I872 Venous insufficiency (chronic) (peripheral): Secondary | ICD-10-CM | POA: Diagnosis not present

## 2021-07-12 ENCOUNTER — Telehealth: Payer: Self-pay | Admitting: Acute Care

## 2021-07-12 NOTE — Telephone Encounter (Signed)
Spoke with pt regarding scheduling a f/u lung screening CT scan. Pt states her daughter will call back to get this scheduled. I verified that pt has my call back number. Will await call back.

## 2021-07-13 DIAGNOSIS — Z79899 Other long term (current) drug therapy: Secondary | ICD-10-CM | POA: Diagnosis not present

## 2021-07-13 DIAGNOSIS — M810 Age-related osteoporosis without current pathological fracture: Secondary | ICD-10-CM | POA: Diagnosis not present

## 2021-07-13 DIAGNOSIS — I10 Essential (primary) hypertension: Secondary | ICD-10-CM | POA: Diagnosis not present

## 2021-07-13 DIAGNOSIS — Z780 Asymptomatic menopausal state: Secondary | ICD-10-CM | POA: Diagnosis not present

## 2021-07-13 DIAGNOSIS — Z23 Encounter for immunization: Secondary | ICD-10-CM | POA: Diagnosis not present

## 2021-07-13 DIAGNOSIS — J439 Emphysema, unspecified: Secondary | ICD-10-CM | POA: Diagnosis not present

## 2021-07-25 DIAGNOSIS — H26492 Other secondary cataract, left eye: Secondary | ICD-10-CM | POA: Diagnosis not present

## 2021-07-25 DIAGNOSIS — Z0100 Encounter for examination of eyes and vision without abnormal findings: Secondary | ICD-10-CM | POA: Diagnosis not present

## 2021-08-08 DIAGNOSIS — I739 Peripheral vascular disease, unspecified: Secondary | ICD-10-CM | POA: Diagnosis not present

## 2021-08-08 DIAGNOSIS — L97321 Non-pressure chronic ulcer of left ankle limited to breakdown of skin: Secondary | ICD-10-CM | POA: Diagnosis not present

## 2021-08-22 DIAGNOSIS — Z0100 Encounter for examination of eyes and vision without abnormal findings: Secondary | ICD-10-CM | POA: Diagnosis not present

## 2021-10-10 DIAGNOSIS — I739 Peripheral vascular disease, unspecified: Secondary | ICD-10-CM | POA: Diagnosis not present

## 2021-10-10 DIAGNOSIS — M79675 Pain in left toe(s): Secondary | ICD-10-CM | POA: Diagnosis not present

## 2021-10-10 DIAGNOSIS — B351 Tinea unguium: Secondary | ICD-10-CM | POA: Diagnosis not present

## 2021-10-10 DIAGNOSIS — L97321 Non-pressure chronic ulcer of left ankle limited to breakdown of skin: Secondary | ICD-10-CM | POA: Diagnosis not present

## 2021-10-10 DIAGNOSIS — M79674 Pain in right toe(s): Secondary | ICD-10-CM | POA: Diagnosis not present

## 2021-12-12 DIAGNOSIS — L97321 Non-pressure chronic ulcer of left ankle limited to breakdown of skin: Secondary | ICD-10-CM | POA: Diagnosis not present

## 2021-12-16 ENCOUNTER — Other Ambulatory Visit (INDEPENDENT_AMBULATORY_CARE_PROVIDER_SITE_OTHER): Payer: Self-pay | Admitting: Vascular Surgery

## 2021-12-16 DIAGNOSIS — I739 Peripheral vascular disease, unspecified: Secondary | ICD-10-CM

## 2021-12-16 DIAGNOSIS — I6523 Occlusion and stenosis of bilateral carotid arteries: Secondary | ICD-10-CM

## 2021-12-18 DIAGNOSIS — E785 Hyperlipidemia, unspecified: Secondary | ICD-10-CM | POA: Insufficient documentation

## 2021-12-18 NOTE — Progress Notes (Signed)
MRN : [id number]  Tracey Rogers is a 76 y.o. ([birth year]) female who presents with chief complaint of check circulation.  History of Present Illness:   The patient returns to the office for followup and review of the noninvasive studies. There have been no interval changes in lower extremity symptoms. No interval shortening of the patient's claudication distance or development of rest pain symptoms. No new ulcers or wounds have occurred since the last visit.   The patient is also followed for carotid stenosis. The carotid stenosis followed by ultrasound.    The patient denies amaurosis fugax. There is no recent history of TIA symptoms or focal motor deficits. There is no prior documented CVA.   The patient is taking enteric-coated aspirin 81 mg daily.   There have been no significant changes to the patient's overall health care.   The patient denies history of DVT, PE or superficial thrombophlebitis. The patient denies recent episodes of angina or shortness of breath.   ABI Rt=1.06 and Lt=1.04  (previous ABI's Rt=0.99 and Lt=1.13 ) Previous duplex ultrasound  today of the left leg shows the SFA stent is patent with a moderate stenosis with triphasic signals at the ankle no change compared to previous study   Carotid Duplex RICA subtotal occlusion no change compared to 83/05/5174 and LICA 16-07% no changed  No outpatient medications have been marked as taking for the 12/19/21 encounter (Appointment) with Delana Meyer, Dolores Lory, MD.    Past Medical History:  Diagnosis Date   Arthritis    Complication of anesthesia    gets weak after surgery   Constipation    COPD (chronic obstructive pulmonary disease) ([place])    mild   Family history of adverse reaction to anesthesia    son gets postop nausea and vomiting, weak   GERD (gastroesophageal reflux disease)    Heart murmur    Hypertension    Peripheral vascular disease ([place])    Psoriasis    Psoriasis    Skin  ulcer of right great toe ([place])    Ulcer of left ankle ([place])    Wears dentures     Past Surgical History:  Procedure Laterality Date   APPLICATION OF A-CELL OF EXTREMITY Left 04/13/2014   Procedure: PLACEMENT OF APPLICATION OF A-CELL ;  Surgeon: Theodoro Kos, DO;  Location: [place];  Service: Plastics;  Laterality: Left;   APPLICATION OF A-CELL OF EXTREMITY Left 09/10/2014   Procedure: APPLICATION OF A-CELL OF EXTREMITY;  Surgeon: Theodoro Kos, DO;  Location: [place];  Service: Plastics;  Laterality: Left;   CARDIOVASCULAR STRESS TEST  03-03-2014  dr Saralyn Pilar   normal lexi scan sestamibi study/  normal LVF without evidence for significant scar or ischemia   CATARACT EXTRACTION W/ INTRAOCULAR LENS  IMPLANT, BILATERAL  2015   CHOLECYSTECTOMY N/A 06/16/2015   Procedure: LAPAROSCOPIC CHOLECYSTECTOMY WITH CHOLANGIOGRAM;  Surgeon: Robert Bellow, MD;  Location: [facility];  Service: General;  Laterality: N/A;   DILATION AND CURETTAGE OF UTERUS     ENDOVASCULAR STENT INSERTION  sept  &  oct  2015   LEFT LEG STENTING--  common and superficial femoral artery   ENDOVASCULAR STENT INSERTION  May 27, 2014   LifeStent bare metal stent left SFA   HERNIA REPAIR  10/27/2015   Ventral hernia repaired with Ventrio ST mesh above previous umbilical port site   I & D EXTREMITY Left  09/10/2014   Procedure: IRRIGATION AND DEBRIDEMENT LEFT ANKLE WOUND SURGICAL PREP ;  Surgeon: Theodoro Kos, DO;  Location: [place];  Service: Plastics;  Laterality: Left;   INCISION AND DRAINAGE OF WOUND Bilateral 04/13/2014   Procedure: IRRIGATION AND DEBRIDEMENT OF LEFT ANKLE WOUND AND RIGHT FOOT;  Surgeon: Theodoro Kos, DO;  Location: [place];  Service: Plastics;  Laterality: Bilateral;   LOWER EXTREMITY ANGIOGRAPHY Left 06/26/2017   Procedure: LOWER EXTREMITY ANGIOGRAPHY;  Surgeon: Katha Cabal, MD;  Location: [place] [facility];  Service:  Cardiovascular;  Laterality: Left;   TRANSTHORACIC ECHOCARDIOGRAM  03-03-2014   normal LVF/  ef 55-60%/  mild TI and MI   TUBAL LIGATION     VENTRAL HERNIA REPAIR N/A 10/27/2015   Procedure: HERNIA REPAIR VENTRAL ADULT;  Surgeon: Robert Bellow, MD;  Location: [facility];  Service: General;  Laterality: N/A;    Social History Social History   Tobacco Use   Smoking status: Every Day    Packs/day: 0.25    Years: 58.00    Total pack years: 14.50    Types: Cigarettes   Smokeless tobacco: Never   Tobacco comments:    no smokers in her home  Vaping Use   Vaping Use: Never used  Substance Use Topics   Alcohol use: No    Alcohol/week: 0.0 standard drinks of alcohol   Drug use: No    Family History Family History  Problem Relation Age of Onset   Kidney disease Mother    Hypertension Mother    Heart disease Mother    Breast cancer Maternal Aunt 55   Heart disease Sister    Stroke Sister     Allergies  Allergen Reactions   Clarithromycin Rash    "Mycin"   Methocarbamol Rash   Minocin [Minocycline Hcl] Rash   Other Rash    Optifoam applied to her left achilles wound.    Tetracycline Rash   Tetracyclines & Related Rash     REVIEW OF SYSTEMS (Negative unless checked)  Constitutional: '[]'$ Weight loss  '[]'$ Fever  '[]'$ Chills Cardiac: '[]'$ Chest pain   '[]'$ Chest pressure   '[]'$ Palpitations   '[]'$ Shortness of breath when laying flat   '[]'$ Shortness of breath with exertion. Vascular:  '[x]'$ Pain in legs with walking   '[]'$ Pain in legs at rest  '[]'$ History of DVT   '[]'$ Phlebitis   '[]'$ Swelling in legs   '[]'$ Varicose veins   '[]'$ Non-healing ulcers Pulmonary:   '[]'$ Uses home oxygen   '[]'$ Productive cough   '[]'$ Hemoptysis   '[]'$ Wheeze  '[]'$ COPD   '[]'$ Asthma Neurologic:  '[]'$ Dizziness   '[]'$ Seizures   '[]'$ History of stroke   '[]'$ History of TIA  '[]'$ Aphasia   '[]'$ Vissual changes   '[]'$ Weakness or numbness in arm   '[]'$ Weakness or numbness in leg Musculoskeletal:   '[]'$ Joint swelling   '[]'$ Joint pain   '[]'$ Low back pain Hematologic:  '[]'$ Easy  bruising  '[]'$ Easy bleeding   '[]'$ Hypercoagulable state   '[]'$ Anemic Gastrointestinal:  '[]'$ Diarrhea   '[]'$ Vomiting  '[]'$ Gastroesophageal reflux/heartburn   '[]'$ Difficulty swallowing. Genitourinary:  '[]'$ Chronic kidney disease   '[]'$ Difficult urination  '[]'$ Frequent urination   '[]'$ Blood in urine Skin:  '[]'$ Rashes   '[]'$ Ulcers  Psychological:  '[]'$ History of anxiety   '[]'$  History of major depression.  Physical Examination  There were no vitals filed for this visit. There is no height or weight on file to calculate BMI. Gen: WD/WN, NAD Head: [place]/AT, No temporalis wasting.  Ear/Nose/Throat: Hearing grossly intact, nares w/o erythema or drainage Eyes: PER, EOMI, sclera nonicteric.  Neck: Supple, no masses.  No bruit or JVD.  Pulmonary:  Good air movement, no audible wheezing, no use of accessory muscles.  Cardiac: RRR, normal S1, S2, no Murmurs. Vascular:  bilateral carotid bruits.  mild trophic changes, no open wounds Vessel Right Left  Radial Palpable Palpable  PT Not Palpable Not Palpable  DP Not Palpable Not Palpable  Gastrointestinal: soft, non-distended. No guarding/no peritoneal signs.  Musculoskeletal: M/S 5/5 throughout.  No visible deformity.  Neurologic: CN 2-12 intact. Pain and light touch intact in extremities.  Symmetrical.  Speech is fluent. Motor exam as listed above. Psychiatric: Judgment intact, Mood & affect appropriate for pt's clinical situation. Dermatologic: No rashes or ulcers noted.  No changes consistent with cellulitis.   CBC Lab Results  Component Value Date   WBC 13.5 (H) 01/07/2020   HGB 12.9 01/07/2020   HCT 38.4 01/07/2020   MCV 92 01/07/2020   PLT 251 01/07/2020    BMET    Component Value Date/Time   NA 135 01/07/2020 1132   NA 128 (L) 05/27/2014 1253   K 4.4 01/07/2020 1132   K 4.2 05/27/2014 1253   CL 100 01/07/2020 1132   CL 97 (L) 05/27/2014 1253   CO2 20 01/07/2020 1132   CO2 25 05/27/2014 1253   GLUCOSE 78 01/07/2020 1132   GLUCOSE 111 (H) 02/28/2017 1105    GLUCOSE 84 05/27/2014 1253   BUN 9 01/07/2020 1132   BUN 9 05/27/2014 1253   CREATININE 0.93 01/07/2020 1132   CREATININE 0.82 02/28/2017 1105   CALCIUM 9.4 01/07/2020 1132   CALCIUM 9.1 05/27/2014 1253   GFRNONAA 61 01/07/2020 1132   GFRNONAA 72 02/28/2017 1105   GFRAA 71 01/07/2020 1132   GFRAA 84 02/28/2017 1105   CrCl cannot be calculated (Patient's most recent lab result is older than the maximum 21 days allowed.).  COAG Lab Results  Component Value Date   INR 0.9 03/18/2014    Radiology No results found.   Assessment/Plan 1. Atherosclerosis of native artery of both lower extremities with intermittent claudication (HCC) Recommend:   The patient has evidence of atherosclerosis of the lower extremities with claudication.  The patient does not voice lifestyle limiting changes at this point in time.     Noninvasive studies do not suggest clinically significant change. She appears to have good perfusion for wound healing.   No invasive studies, angiography or surgery at this time The patient should continue walking and begin a more formal exercise program. The patient should continue antiplatelet therapy and aggressive treatment of the lipid abnormalities   No changes in the patient's medications at this time   The patient should continue wearing graduated compression socks 10-15 mmHg strength to control the mild edema.   2. Bilateral carotid artery stenosis Recommend:   Given the patient's asymptomatic carotid stenosis is unchanged compared to two years ago, no further invasive testing or surgery at this time.  She is not interested in in repair.   Continue antiplatelet therapy as prescribed Continue management of CAD, HTN and Hyperlipidemia Healthy heart diet,  encouraged exercise at least 4 times per week Follow up in 12 months with duplex - VAS US CAROTID; Future  3. Subclavian artery stenosis, right (HCC) Recommend:   Given the patient's asymptomatic  carotid stenosis is unchanged compared to two years ago, no further invasive testing or surgery at this time.  She is not interested in in repair.   Continue antiplatelet therapy as prescribed Continue management of CAD,  HTN and Hyperlipidemia Healthy heart diet,  encouraged exercise at least 4 times per week Follow up in 12 months with duplex  4. Essential hypertension Continue antihypertensive medications as already ordered, these medications have been reviewed and there are no changes at this time.   5. Centrilobular emphysema ([place]) Continue pulmonary medications and aerosols as already ordered, these medications have been reviewed and there are no changes at this time.    6. Mixed hyperlipidemia Continue statin as ordered and reviewed, no changes at this time     Hortencia Pilar, MD  12/18/2021 1:57 PM

## 2021-12-19 ENCOUNTER — Encounter (INDEPENDENT_AMBULATORY_CARE_PROVIDER_SITE_OTHER): Payer: Self-pay | Admitting: Vascular Surgery

## 2021-12-19 ENCOUNTER — Ambulatory Visit (INDEPENDENT_AMBULATORY_CARE_PROVIDER_SITE_OTHER): Payer: Medicare HMO

## 2021-12-19 ENCOUNTER — Ambulatory Visit (INDEPENDENT_AMBULATORY_CARE_PROVIDER_SITE_OTHER): Payer: Medicare HMO | Admitting: Vascular Surgery

## 2021-12-19 VITALS — BP 158/79 | HR 69 | Resp 16 | Wt 152.6 lb

## 2021-12-19 DIAGNOSIS — I6523 Occlusion and stenosis of bilateral carotid arteries: Secondary | ICD-10-CM

## 2021-12-19 DIAGNOSIS — E782 Mixed hyperlipidemia: Secondary | ICD-10-CM

## 2021-12-19 DIAGNOSIS — I70213 Atherosclerosis of native arteries of extremities with intermittent claudication, bilateral legs: Secondary | ICD-10-CM

## 2021-12-19 DIAGNOSIS — Z9889 Other specified postprocedural states: Secondary | ICD-10-CM

## 2021-12-19 DIAGNOSIS — I10 Essential (primary) hypertension: Secondary | ICD-10-CM | POA: Diagnosis not present

## 2021-12-19 DIAGNOSIS — J432 Centrilobular emphysema: Secondary | ICD-10-CM

## 2021-12-19 DIAGNOSIS — I771 Stricture of artery: Secondary | ICD-10-CM | POA: Diagnosis not present

## 2021-12-19 DIAGNOSIS — I739 Peripheral vascular disease, unspecified: Secondary | ICD-10-CM | POA: Diagnosis not present

## 2021-12-25 ENCOUNTER — Encounter (INDEPENDENT_AMBULATORY_CARE_PROVIDER_SITE_OTHER): Payer: Self-pay | Admitting: Vascular Surgery

## 2022-02-15 ENCOUNTER — Other Ambulatory Visit: Payer: Self-pay | Admitting: Internal Medicine

## 2022-02-15 DIAGNOSIS — Z1231 Encounter for screening mammogram for malignant neoplasm of breast: Secondary | ICD-10-CM

## 2022-02-17 DIAGNOSIS — L97321 Non-pressure chronic ulcer of left ankle limited to breakdown of skin: Secondary | ICD-10-CM | POA: Diagnosis not present

## 2022-02-17 DIAGNOSIS — B351 Tinea unguium: Secondary | ICD-10-CM | POA: Diagnosis not present

## 2022-02-17 DIAGNOSIS — I739 Peripheral vascular disease, unspecified: Secondary | ICD-10-CM | POA: Diagnosis not present

## 2022-02-17 DIAGNOSIS — M79674 Pain in right toe(s): Secondary | ICD-10-CM | POA: Diagnosis not present

## 2022-02-17 DIAGNOSIS — M79675 Pain in left toe(s): Secondary | ICD-10-CM | POA: Diagnosis not present

## 2022-03-08 DIAGNOSIS — I10 Essential (primary) hypertension: Secondary | ICD-10-CM | POA: Diagnosis not present

## 2022-03-08 DIAGNOSIS — Z780 Asymptomatic menopausal state: Secondary | ICD-10-CM | POA: Diagnosis not present

## 2022-03-15 DIAGNOSIS — Z0000 Encounter for general adult medical examination without abnormal findings: Secondary | ICD-10-CM | POA: Diagnosis not present

## 2022-03-15 DIAGNOSIS — I10 Essential (primary) hypertension: Secondary | ICD-10-CM | POA: Diagnosis not present

## 2022-03-15 DIAGNOSIS — Z1331 Encounter for screening for depression: Secondary | ICD-10-CM | POA: Diagnosis not present

## 2022-03-15 DIAGNOSIS — J439 Emphysema, unspecified: Secondary | ICD-10-CM | POA: Diagnosis not present

## 2022-03-15 DIAGNOSIS — I6523 Occlusion and stenosis of bilateral carotid arteries: Secondary | ICD-10-CM | POA: Diagnosis not present

## 2022-03-15 DIAGNOSIS — R0602 Shortness of breath: Secondary | ICD-10-CM | POA: Diagnosis not present

## 2022-03-15 DIAGNOSIS — M810 Age-related osteoporosis without current pathological fracture: Secondary | ICD-10-CM | POA: Diagnosis not present

## 2022-03-15 DIAGNOSIS — Z780 Asymptomatic menopausal state: Secondary | ICD-10-CM | POA: Diagnosis not present

## 2022-03-15 DIAGNOSIS — R053 Chronic cough: Secondary | ICD-10-CM | POA: Diagnosis not present

## 2022-03-20 ENCOUNTER — Ambulatory Visit
Admission: RE | Admit: 2022-03-20 | Discharge: 2022-03-20 | Disposition: A | Discharge status: Home / Self Care | Payer: Medicare HMO | Source: Ambulatory Visit | Attending: Internal Medicine | Admitting: Internal Medicine

## 2022-03-20 DIAGNOSIS — Z1231 Encounter for screening mammogram for malignant neoplasm of breast: Secondary | ICD-10-CM | POA: Diagnosis not present

## 2022-03-27 ENCOUNTER — Encounter (INDEPENDENT_AMBULATORY_CARE_PROVIDER_SITE_OTHER): Payer: Self-pay

## 2022-04-24 DIAGNOSIS — L97321 Non-pressure chronic ulcer of left ankle limited to breakdown of skin: Secondary | ICD-10-CM | POA: Diagnosis not present

## 2022-04-24 DIAGNOSIS — I739 Peripheral vascular disease, unspecified: Secondary | ICD-10-CM | POA: Diagnosis not present

## 2022-06-18 NOTE — Progress Notes (Signed)
MRN : [id number]  Tracey Rogers is a 77 y.o. (1946-05-17) female who presents with chief complaint of check circulation.  History of Present Illness:   The patient returns to the office for followup and review of the noninvasive studies. There have been no interval changes in lower extremity symptoms. No interval shortening of the patient's claudication distance or development of rest pain symptoms. No new ulcers or wounds have occurred since the last visit.   The patient is also followed for carotid stenosis. The carotid stenosis followed by ultrasound.    The patient denies amaurosis fugax. There is no recent history of TIA symptoms or focal motor deficits. There is no prior documented CVA.   The patient is taking enteric-coated aspirin 81 mg daily.   There have been no significant changes to the patient's overall health care.   The patient denies history of DVT, PE or superficial thrombophlebitis. The patient denies recent episodes of angina or shortness of breath.   Most recent ABI Rt=1.06 and Lt=1.04  (previous ABI's Rt=0.99 and Lt=1.13 ) Previous duplex ultrasound  today of the left leg shows the SFA stent is patent with a moderate stenosis with triphasic signals at the ankle no change compared to previous study   Carotid Duplex RICA subtotal occlusion no change compared to 42/12/7679 and LICA 15-72% no changed compared to the last study  No outpatient medications have been marked as taking for the 06/19/22 encounter (Appointment) with Delana Meyer, Dolores Lory, MD.    Past Medical History:  Diagnosis Date   Arthritis    Complication of anesthesia    gets weak after surgery   Constipation    COPD (chronic obstructive pulmonary disease) ([place])    mild   Family history of adverse reaction to anesthesia    son gets postop nausea and vomiting, weak   GERD (gastroesophageal reflux disease)    Heart murmur    Hypertension    Peripheral vascular disease ([place])     Psoriasis    Psoriasis    Skin ulcer of right great toe ([place])    Ulcer of left ankle ([place])    Wears dentures     Past Surgical History:  Procedure Laterality Date   APPLICATION OF A-CELL OF EXTREMITY Left 04/13/2014   Procedure: PLACEMENT OF APPLICATION OF A-CELL ;  Surgeon: Theodoro Kos, DO;  Location: [place];  Service: Plastics;  Laterality: Left;   APPLICATION OF A-CELL OF EXTREMITY Left 09/10/2014   Procedure: APPLICATION OF A-CELL OF EXTREMITY;  Surgeon: Theodoro Kos, DO;  Location: [place];  Service: Plastics;  Laterality: Left;   CARDIOVASCULAR STRESS TEST  03-03-2014  dr Saralyn Pilar   normal lexi scan sestamibi study/  normal LVF without evidence for significant scar or ischemia   CATARACT EXTRACTION W/ INTRAOCULAR LENS  IMPLANT, BILATERAL  2015   CHOLECYSTECTOMY N/A 06/16/2015   Procedure: LAPAROSCOPIC CHOLECYSTECTOMY WITH CHOLANGIOGRAM;  Surgeon: Robert Bellow, MD;  Location: [facility];  Service: General;  Laterality: N/A;   DILATION AND CURETTAGE OF UTERUS     ENDOVASCULAR STENT INSERTION  sept  &  oct  2015   LEFT LEG STENTING--  common and superficial femoral artery   ENDOVASCULAR STENT INSERTION  May 27, 2014   LifeStent bare metal stent left SFA   HERNIA REPAIR  10/27/2015   Ventral hernia repaired with Ventrio ST mesh above previous umbilical port site  I & D EXTREMITY Left 09/10/2014   Procedure: IRRIGATION AND DEBRIDEMENT LEFT ANKLE WOUND SURGICAL PREP ;  Surgeon: Theodoro Kos, DO;  Location: [place];  Service: Plastics;  Laterality: Left;   INCISION AND DRAINAGE OF WOUND Bilateral 04/13/2014   Procedure: IRRIGATION AND DEBRIDEMENT OF LEFT ANKLE WOUND AND RIGHT FOOT;  Surgeon: Theodoro Kos, DO;  Location: [place];  Service: Plastics;  Laterality: Bilateral;   LOWER EXTREMITY ANGIOGRAPHY Left 06/26/2017   Procedure: LOWER EXTREMITY ANGIOGRAPHY;  Surgeon: Katha Cabal, MD;   Location: [place] [facility];  Service: Cardiovascular;  Laterality: Left;   TRANSTHORACIC ECHOCARDIOGRAM  03-03-2014   normal LVF/  ef 55-60%/  mild TI and MI   TUBAL LIGATION     VENTRAL HERNIA REPAIR N/A 10/27/2015   Procedure: HERNIA REPAIR VENTRAL ADULT;  Surgeon: Robert Bellow, MD;  Location: [facility];  Service: General;  Laterality: N/A;    Social History Social History   Tobacco Use   Smoking status: Every Day    Packs/day: 0.25    Years: 58.00    Total pack years: 14.50    Types: Cigarettes   Smokeless tobacco: Never   Tobacco comments:    no smokers in her home  Vaping Use   Vaping Use: Never used  Substance Use Topics   Alcohol use: No    Alcohol/week: 0.0 standard drinks of alcohol   Drug use: No    Family History Family History  Problem Relation Age of Onset   Kidney disease Mother    Hypertension Mother    Heart disease Mother    Breast cancer Maternal Aunt 2   Heart disease Sister    Stroke Sister     Allergies  Allergen Reactions   Clarithromycin Rash    "Mycin"   Methocarbamol Rash   Minocin [Minocycline Hcl] Rash   Other Rash    Optifoam applied to her left achilles wound.    Tetracycline Rash   Tetracyclines & Related Rash     REVIEW OF SYSTEMS (Negative unless checked)  Constitutional: '[]'$ Weight loss  '[]'$ Fever  '[]'$ Chills Cardiac: '[]'$ Chest pain   '[]'$ Chest pressure   '[]'$ Palpitations   '[]'$ Shortness of breath when laying flat   '[]'$ Shortness of breath with exertion. Vascular:  '[x]'$ Pain in legs with walking   '[]'$ Pain in legs at rest  '[]'$ History of DVT   '[]'$ Phlebitis   '[]'$ Swelling in legs   '[]'$ Varicose veins   '[]'$ Non-healing ulcers Pulmonary:   '[]'$ Uses home oxygen   '[]'$ Productive cough   '[]'$ Hemoptysis   '[]'$ Wheeze  '[]'$ COPD   '[]'$ Asthma Neurologic:  '[]'$ Dizziness   '[]'$ Seizures   '[]'$ History of stroke   '[]'$ History of TIA  '[]'$ Aphasia   '[]'$ Vissual changes   '[]'$ Weakness or numbness in arm   '[]'$ Weakness or numbness in leg Musculoskeletal:   '[]'$ Joint swelling   '[]'$ Joint pain    '[]'$ Low back pain Hematologic:  '[]'$ Easy bruising  '[]'$ Easy bleeding   '[]'$ Hypercoagulable state   '[]'$ Anemic Gastrointestinal:  '[]'$ Diarrhea   '[]'$ Vomiting  '[]'$ Gastroesophageal reflux/heartburn   '[]'$ Difficulty swallowing. Genitourinary:  '[]'$ Chronic kidney disease   '[]'$ Difficult urination  '[]'$ Frequent urination   '[]'$ Blood in urine Skin:  '[]'$ Rashes   '[]'$ Ulcers  Psychological:  '[]'$ History of anxiety   '[]'$  History of major depression.  Physical Examination  There were no vitals filed for this visit. There is no height or weight on file to calculate BMI. Gen: WD/WN, NAD Head: [place]/AT, No temporalis wasting.  Ear/Nose/Throat: Hearing grossly intact, nares w/o erythema or drainage Eyes:  PER, EOMI, sclera nonicteric.  Neck: Supple, no masses.  No bruit or JVD.  Pulmonary:  Good air movement, no audible wheezing, no use of accessory muscles.  Cardiac: RRR, normal S1, S2, no Murmurs. Vascular:  mild trophic changes, no open wounds Vessel Right Left  Radial Palpable Palpable  PT Not Palpable Not Palpable  DP Not Palpable Not Palpable  Gastrointestinal: soft, non-distended. No guarding/no peritoneal signs.  Musculoskeletal: M/S 5/5 throughout.  No visible deformity.  Neurologic: CN 2-12 intact. Pain and light touch intact in extremities.  Symmetrical.  Speech is fluent. Motor exam as listed above. Psychiatric: Judgment intact, Mood & affect appropriate for pt's clinical situation. Dermatologic: No rashes or ulcers noted.  No changes consistent with cellulitis.   CBC Lab Results  Component Value Date   WBC 13.5 (H) 01/07/2020   HGB 12.9 01/07/2020   HCT 38.4 01/07/2020   MCV 92 01/07/2020   PLT 251 01/07/2020    BMET    Component Value Date/Time   NA 135 01/07/2020 1132   NA 128 (L) 05/27/2014 1253   K 4.4 01/07/2020 1132   K 4.2 05/27/2014 1253   CL 100 01/07/2020 1132   CL 97 (L) 05/27/2014 1253   CO2 20 01/07/2020 1132   CO2 25 05/27/2014 1253   GLUCOSE 78 01/07/2020 1132   GLUCOSE 111 (H)  02/28/2017 1105   GLUCOSE 84 05/27/2014 1253   BUN 9 01/07/2020 1132   BUN 9 05/27/2014 1253   CREATININE 0.93 01/07/2020 1132   CREATININE 0.82 02/28/2017 1105   CALCIUM 9.4 01/07/2020 1132   CALCIUM 9.1 05/27/2014 1253   GFRNONAA 61 01/07/2020 1132   GFRNONAA 72 02/28/2017 1105   GFRAA 71 01/07/2020 1132   GFRAA 84 02/28/2017 1105   CrCl cannot be calculated (Patient's most recent lab result is older than the maximum 21 days allowed.).  COAG Lab Results  Component Value Date   INR 0.9 03/18/2014    Radiology No results found.   Assessment/Plan 1. Atherosclerosis of native artery of both lower extremities with intermittent claudication (HCC) Recommend:   The patient has evidence of atherosclerosis of the lower extremities with claudication.  The patient does not voice lifestyle limiting changes at this point in time.     Noninvasive studies do not suggest clinically significant change. She appears to have good perfusion for wound healing.   No invasive studies, angiography or surgery at this time The patient should continue walking and begin a more formal exercise program. The patient should continue antiplatelet therapy and aggressive treatment of the lipid abnormalities   No changes in the patient's medications at this time   The patient should continue wearing graduated compression socks 10-15 mmHg strength to control the mild edema.  - VAS [place] ABI WITH/WO TBI; Future  2. Bilateral carotid artery stenosis Recommend:   Given the patient's asymptomatic carotid stenosis is unchanged compared to two years ago, no further invasive testing or surgery at this time.  She is not interested in in repair.   Continue antiplatelet therapy as prescribed Continue management of CAD, HTN and Hyperlipidemia Healthy heart diet,  encouraged exercise at least 4 times per week Follow up in 12 months with duplex - VAS US CAROTID; Future  3. Chronic venous insufficiency No surgery  or intervention at this point in time.     I have had a long discussion with the patient regarding venous insufficiency and why it  causes symptoms. I have discussed with the patient the  chronic skin changes that accompany venous insufficiency and the long term sequela such as infection and ulceration.  Patient will begin wearing graduated compression stockings class 1 (20-30 mmHg) or compression wraps on a daily basis a prescription was given. The patient will put the stockings on first thing in the morning and removing them in the evening. The patient is instructed specifically not to sleep in the stockings.     In addition, behavioral modification including several periods of elevation of the lower extremities during the day will be continued. I have demonstrated that proper elevation is a position with the ankles at heart level.   The patient is instructed to begin routine exercise, especially walking on a daily basis  4. Subclavian artery stenosis, right [facility]) Patient remains asymptomatic with respect to her subclavian stenosis.  Therefore we will continue monitoring with noninvasive studies and physical examination.  No invasive studies or surgery is recommended at this time.  5. Essential hypertension Continue antihypertensive medications as already ordered, these medications have been reviewed and there are no changes at this time.  6. Mixed hyperlipidemia Continue statin as ordered and reviewed, no changes at this time    Hortencia Pilar, MD  06/18/2022 3:45 PM

## 2022-06-19 ENCOUNTER — Encounter (INDEPENDENT_AMBULATORY_CARE_PROVIDER_SITE_OTHER): Payer: Self-pay | Admitting: Vascular Surgery

## 2022-06-19 ENCOUNTER — Ambulatory Visit (INDEPENDENT_AMBULATORY_CARE_PROVIDER_SITE_OTHER): Payer: Medicare HMO | Admitting: Vascular Surgery

## 2022-06-19 ENCOUNTER — Ambulatory Visit (INDEPENDENT_AMBULATORY_CARE_PROVIDER_SITE_OTHER): Payer: Medicare HMO

## 2022-06-19 VITALS — BP 143/77 | HR 84 | Ht 64.0 in | Wt 155.0 lb

## 2022-06-19 DIAGNOSIS — I10 Essential (primary) hypertension: Secondary | ICD-10-CM | POA: Diagnosis not present

## 2022-06-19 DIAGNOSIS — I771 Stricture of artery: Secondary | ICD-10-CM | POA: Diagnosis not present

## 2022-06-19 DIAGNOSIS — I6523 Occlusion and stenosis of bilateral carotid arteries: Secondary | ICD-10-CM

## 2022-06-19 DIAGNOSIS — I872 Venous insufficiency (chronic) (peripheral): Secondary | ICD-10-CM

## 2022-06-19 DIAGNOSIS — E782 Mixed hyperlipidemia: Secondary | ICD-10-CM

## 2022-06-19 DIAGNOSIS — I70213 Atherosclerosis of native arteries of extremities with intermittent claudication, bilateral legs: Secondary | ICD-10-CM | POA: Diagnosis not present

## 2022-06-21 DIAGNOSIS — M79675 Pain in left toe(s): Secondary | ICD-10-CM | POA: Diagnosis not present

## 2022-06-21 DIAGNOSIS — M79674 Pain in right toe(s): Secondary | ICD-10-CM | POA: Diagnosis not present

## 2022-06-21 DIAGNOSIS — B351 Tinea unguium: Secondary | ICD-10-CM | POA: Diagnosis not present

## 2022-06-21 DIAGNOSIS — I739 Peripheral vascular disease, unspecified: Secondary | ICD-10-CM | POA: Diagnosis not present

## 2022-06-21 DIAGNOSIS — L97321 Non-pressure chronic ulcer of left ankle limited to breakdown of skin: Secondary | ICD-10-CM | POA: Diagnosis not present

## 2022-06-24 ENCOUNTER — Encounter (INDEPENDENT_AMBULATORY_CARE_PROVIDER_SITE_OTHER): Payer: Self-pay | Admitting: Vascular Surgery

## 2022-07-31 DIAGNOSIS — H26492 Other secondary cataract, left eye: Secondary | ICD-10-CM | POA: Diagnosis not present

## 2022-07-31 DIAGNOSIS — H43813 Vitreous degeneration, bilateral: Secondary | ICD-10-CM | POA: Diagnosis not present

## 2022-07-31 DIAGNOSIS — Z961 Presence of intraocular lens: Secondary | ICD-10-CM | POA: Diagnosis not present

## 2022-10-11 DIAGNOSIS — I739 Peripheral vascular disease, unspecified: Secondary | ICD-10-CM | POA: Diagnosis not present

## 2022-10-11 DIAGNOSIS — B351 Tinea unguium: Secondary | ICD-10-CM | POA: Diagnosis not present

## 2022-10-11 DIAGNOSIS — M79675 Pain in left toe(s): Secondary | ICD-10-CM | POA: Diagnosis not present

## 2022-10-11 DIAGNOSIS — M79674 Pain in right toe(s): Secondary | ICD-10-CM | POA: Diagnosis not present

## 2022-10-11 DIAGNOSIS — L97321 Non-pressure chronic ulcer of left ankle limited to breakdown of skin: Secondary | ICD-10-CM | POA: Diagnosis not present

## 2022-10-30 DIAGNOSIS — Z0000 Encounter for general adult medical examination without abnormal findings: Secondary | ICD-10-CM | POA: Diagnosis not present

## 2022-10-30 DIAGNOSIS — F17210 Nicotine dependence, cigarettes, uncomplicated: Secondary | ICD-10-CM | POA: Diagnosis not present

## 2022-10-30 DIAGNOSIS — I6523 Occlusion and stenosis of bilateral carotid arteries: Secondary | ICD-10-CM | POA: Diagnosis not present

## 2022-10-30 DIAGNOSIS — I739 Peripheral vascular disease, unspecified: Secondary | ICD-10-CM | POA: Diagnosis not present

## 2022-10-30 DIAGNOSIS — J439 Emphysema, unspecified: Secondary | ICD-10-CM | POA: Diagnosis not present

## 2022-10-30 DIAGNOSIS — M810 Age-related osteoporosis without current pathological fracture: Secondary | ICD-10-CM | POA: Diagnosis not present

## 2022-10-30 DIAGNOSIS — I10 Essential (primary) hypertension: Secondary | ICD-10-CM | POA: Diagnosis not present

## 2022-10-30 DIAGNOSIS — R739 Hyperglycemia, unspecified: Secondary | ICD-10-CM | POA: Diagnosis not present

## 2022-11-10 ENCOUNTER — Other Ambulatory Visit: Payer: Self-pay | Admitting: Emergency Medicine

## 2022-11-10 DIAGNOSIS — F17210 Nicotine dependence, cigarettes, uncomplicated: Secondary | ICD-10-CM

## 2022-11-10 DIAGNOSIS — Z87891 Personal history of nicotine dependence: Secondary | ICD-10-CM

## 2022-11-10 DIAGNOSIS — Z122 Encounter for screening for malignant neoplasm of respiratory organs: Secondary | ICD-10-CM

## 2022-11-21 ENCOUNTER — Ambulatory Visit
Admission: RE | Admit: 2022-11-21 | Discharge: 2022-11-21 | Disposition: A | Discharge status: Home / Self Care | Payer: Medicare HMO | Source: Ambulatory Visit | Attending: Acute Care | Admitting: Acute Care

## 2022-11-21 DIAGNOSIS — F17210 Nicotine dependence, cigarettes, uncomplicated: Secondary | ICD-10-CM | POA: Insufficient documentation

## 2022-11-21 DIAGNOSIS — Z87891 Personal history of nicotine dependence: Secondary | ICD-10-CM | POA: Insufficient documentation

## 2022-11-21 DIAGNOSIS — Z122 Encounter for screening for malignant neoplasm of respiratory organs: Secondary | ICD-10-CM | POA: Insufficient documentation

## 2022-11-21 DIAGNOSIS — R0602 Shortness of breath: Secondary | ICD-10-CM | POA: Diagnosis not present

## 2022-11-24 ENCOUNTER — Telehealth: Payer: Self-pay | Admitting: Acute Care

## 2022-11-24 DIAGNOSIS — Z87891 Personal history of nicotine dependence: Secondary | ICD-10-CM

## 2022-11-24 DIAGNOSIS — Z122 Encounter for screening for malignant neoplasm of respiratory organs: Secondary | ICD-10-CM

## 2022-11-24 DIAGNOSIS — F17210 Nicotine dependence, cigarettes, uncomplicated: Secondary | ICD-10-CM

## 2022-11-24 NOTE — Telephone Encounter (Signed)
Called and spoke to patient. Informed her of the results per the impression on LDCT. Advised her of the recommendations to have a repeat scan in 6 months. Patient verbalized understanding and is agreeable to a 6 month follow up scan. Order placed. Results have been sent to PCP with plan.    IMPRESSION: 1. Lung-RADS 3, probably benign findings. Short-term follow-up in 6 months is recommended with repeat low-dose chest CT without contrast (please use the following order, "CT CHEST LCS NODULE FOLLOW-UP W/O CM"). Although the exam is motion degraded, there is a probable new right upper lobe pulmonary nodule of volume derived equivalent diameter 5.5 mm. 2. Aortic atherosclerosis (ICD10-I70.0), coronary artery atherosclerosis and emphysema (ICD10-J43.9). 3. Osteopenia with new and progressive thoracolumbar compression deformities.

## 2022-12-18 ENCOUNTER — Ambulatory Visit (INDEPENDENT_AMBULATORY_CARE_PROVIDER_SITE_OTHER): Payer: Medicare HMO

## 2022-12-18 ENCOUNTER — Ambulatory Visit (INDEPENDENT_AMBULATORY_CARE_PROVIDER_SITE_OTHER): Payer: Medicare HMO | Admitting: Vascular Surgery

## 2022-12-18 ENCOUNTER — Encounter (INDEPENDENT_AMBULATORY_CARE_PROVIDER_SITE_OTHER): Payer: Self-pay | Admitting: Vascular Surgery

## 2022-12-18 VITALS — BP 155/69 | HR 72 | Resp 16 | Wt 149.0 lb

## 2022-12-18 DIAGNOSIS — I70213 Atherosclerosis of native arteries of extremities with intermittent claudication, bilateral legs: Secondary | ICD-10-CM | POA: Diagnosis not present

## 2022-12-18 DIAGNOSIS — I10 Essential (primary) hypertension: Secondary | ICD-10-CM

## 2022-12-18 DIAGNOSIS — I7025 Atherosclerosis of native arteries of other extremities with ulceration: Secondary | ICD-10-CM | POA: Diagnosis not present

## 2022-12-18 DIAGNOSIS — E782 Mixed hyperlipidemia: Secondary | ICD-10-CM | POA: Diagnosis not present

## 2022-12-18 DIAGNOSIS — I6523 Occlusion and stenosis of bilateral carotid arteries: Secondary | ICD-10-CM

## 2022-12-18 DIAGNOSIS — J432 Centrilobular emphysema: Secondary | ICD-10-CM

## 2022-12-18 NOTE — Progress Notes (Signed)
MRN : [id number]  Tracey Rogers is a 77 y.o. (30-Sep-1945) female who presents with chief complaint of check circulation.  History of Present Illness:   The patient returns to the office for followup and review of the noninvasive studies. There have been no interval changes in lower extremity symptoms. No interval shortening of the patient's claudication distance or development of rest pain symptoms. No new ulcers or wounds have occurred since the last visit.   The patient is also followed for carotid stenosis. The carotid stenosis followed by ultrasound.    The patient denies amaurosis fugax. There is no recent history of TIA symptoms or focal motor deficits. There is no prior documented CVA.   The patient is taking enteric-coated aspirin 81 mg daily.   There have been no significant changes to the patient's overall health care.   The patient denies history of DVT, PE or superficial thrombophlebitis. The patient denies recent episodes of angina or shortness of breath.   Most recent ABI Rt=0.85 and Lt=0.95  (previous ABI's Rt=1.06 and Lt=1.04 ). Previous duplex ultrasound  today of the left leg shows the SFA stent is patent with a moderate stenosis with triphasic signals at the ankle no change compared to previous study   Carotid Duplex RICA subtotal occlusion no change compared to 04/29/2018 and LICA 40-59% no changed compared to the last study  Current Meds  Medication Sig   acetaminophen (TYLENOL) 325 MG tablet Take 325 mg by mouth every 6 (six) hours as needed for moderate pain or headache.    alendronate (FOSAMAX) 70 MG tablet Take by mouth.   amLODipine (NORVASC) 10 MG tablet Take 1 tablet (10 mg total) by mouth daily.   aspirin EC 81 MG tablet Take 81 mg by mouth every evening.   atorvastatin (LIPITOR) 10 MG tablet Take 1 tablet (10 mg total) by mouth every evening.   clopidogrel (PLAVIX) 75 MG tablet  Take 1 tablet (75 mg total) by mouth daily.   fluticasone (FLONASE) 50 MCG/ACT nasal spray Place 2 sprays into both nostrils daily. (Patient taking differently: Place 2 sprays into both nostrils daily as needed for allergies.)   Magnesium 400 MG CAPS Take 400 mg by mouth daily as needed (cramps).    silver sulfADIAZINE (SILVADENE) 1 % cream APPLY TO LEFT HEAL DAILY AND COVER WITH LIGHT GAUZE   TRELEGY ELLIPTA 200-62.5-25 MCG/ACT AEPB Inhale 1 puff into the lungs daily.   triamcinolone ointment (KENALOG) 0.1 % Apply 1 application topically 2 (two) times daily as needed.    Past Medical History:  Diagnosis Date   Arthritis    Complication of anesthesia    gets weak after surgery   Constipation    COPD (chronic obstructive pulmonary disease) (HCC)    mild   Family history of adverse reaction to anesthesia    son gets postop nausea and vomiting, weak   GERD (gastroesophageal reflux disease)    Heart murmur    Hypertension    Peripheral vascular disease (HCC)    Psoriasis    Psoriasis    Skin ulcer of right great  toe (HCC)    Ulcer of left ankle (HCC)    Wears dentures     Past Surgical History:  Procedure Laterality Date   APPLICATION OF A-CELL OF EXTREMITY Left 04/13/2014   Procedure: PLACEMENT OF APPLICATION OF A-CELL ;  Surgeon: Wayland Denis, DO;  Location: [facility];  Service: Plastics;  Laterality: Left;   APPLICATION OF A-CELL OF EXTREMITY Left 09/10/2014   Procedure: APPLICATION OF A-CELL OF EXTREMITY;  Surgeon: Wayland Denis, DO;  Location: [facility];  Service: Plastics;  Laterality: Left;   CARDIOVASCULAR STRESS TEST  03-03-2014  dr Darrold Junker   normal lexi scan sestamibi study/  normal LVF without evidence for significant scar or ischemia   CATARACT EXTRACTION W/ INTRAOCULAR LENS  IMPLANT, BILATERAL  2015   CHOLECYSTECTOMY N/A 06/16/2015   Procedure: LAPAROSCOPIC CHOLECYSTECTOMY WITH CHOLANGIOGRAM;  Surgeon: Earline Mayotte, MD;  Location:  [facility];  Service: General;  Laterality: N/A;   DILATION AND CURETTAGE OF UTERUS     ENDOVASCULAR STENT INSERTION  sept  &  oct  2015   LEFT LEG STENTING--  common and superficial femoral artery   ENDOVASCULAR STENT INSERTION  May 27, 2014   LifeStent bare metal stent left SFA   HERNIA REPAIR  10/27/2015   Ventral hernia repaired with Ventrio ST mesh above previous umbilical port site   I & D EXTREMITY Left 09/10/2014   Procedure: IRRIGATION AND DEBRIDEMENT LEFT ANKLE WOUND SURGICAL PREP ;  Surgeon: Wayland Denis, DO;  Location:  SURGERY CENTER;  Service: Plastics;  Laterality: Left;   INCISION AND DRAINAGE OF WOUND Bilateral 04/13/2014   Procedure: IRRIGATION AND DEBRIDEMENT OF LEFT ANKLE WOUND AND RIGHT FOOT;  Surgeon: Wayland Denis, DO;  Location: [facility];  Service: Plastics;  Laterality: Bilateral;   LOWER EXTREMITY ANGIOGRAPHY Left 06/26/2017   Procedure: LOWER EXTREMITY ANGIOGRAPHY;  Surgeon: Renford Dills, MD;  Location: [facility];  Service: Cardiovascular;  Laterality: Left;   TRANSTHORACIC ECHOCARDIOGRAM  03-03-2014   normal LVF/  ef 55-60%/  mild TI and MI   TUBAL LIGATION     VENTRAL HERNIA REPAIR N/A 10/27/2015   Procedure: HERNIA REPAIR VENTRAL ADULT;  Surgeon: Earline Mayotte, MD;  Location: [facility];  Service: General;  Laterality: N/A;    Social History Social History   Tobacco Use   Smoking status: Every Day    Current packs/day: 0.25    Average packs/day: 0.3 packs/day for 58.0 years (14.5 ttl pk-yrs)    Types: Cigarettes   Smokeless tobacco: Never   Tobacco comments:    no smokers in her home  Vaping Use   Vaping status: Never Used  Substance Use Topics   Alcohol use: No    Alcohol/week: 0.0 standard drinks of alcohol   Drug use: No    Family History Family History  Problem Relation Age of Onset   Kidney disease Mother    Hypertension Mother    Heart disease Mother    Breast cancer Maternal Aunt 60    Heart disease Sister    Stroke Sister     Allergies  Allergen Reactions   Clarithromycin Rash    "Mycin"   Methocarbamol Rash   Minocin [Minocycline Hcl] Rash   Other Rash    Optifoam applied to her left achilles wound.    Tetracycline Rash   Tetracyclines & Related Rash     REVIEW OF SYSTEMS (Negative unless checked)  Constitutional: [] Weight loss  [] Fever  []   Chills Cardiac: [] Chest pain   [] Chest pressure   [] Palpitations   [] Shortness of breath when laying flat   [] Shortness of breath with exertion. Vascular:  [x] Pain in legs with walking   [] Pain in legs at rest  [] History of DVT   [] Phlebitis   [] Swelling in legs   [] Varicose veins   [] Non-healing ulcers Pulmonary:   [] Uses home oxygen   [] Productive cough   [] Hemoptysis   [] Wheeze  [x] COPD   [] Asthma Neurologic:  [] Dizziness   [] Seizures   [] History of stroke   [] History of TIA  [] Aphasia   [] Vissual changes   [] Weakness or numbness in arm   [] Weakness or numbness in leg Musculoskeletal:   [] Joint swelling   [] Joint pain   [] Low back pain Hematologic:  [] Easy bruising  [] Easy bleeding   [] Hypercoagulable state   [] Anemic Gastrointestinal:  [] Diarrhea   [] Vomiting  [] Gastroesophageal reflux/heartburn   [] Difficulty swallowing. Genitourinary:  [] Chronic kidney disease   [] Difficult urination  [] Frequent urination   [] Blood in urine Skin:  [] Rashes   [x] Ulcers  Psychological:  [] History of anxiety   []  History of major depression.  Physical Examination  Vitals:   12/18/22 1424  BP: (!) 155/69  Pulse: 72  Resp: 16  Weight: 149 lb (67.6 kg)   Body mass index is 25.58 kg/m. Gen: WD/WN, NAD Head: [place]/AT, No temporalis wasting.  Ear/Nose/Throat: Hearing grossly intact, nares w/o erythema or drainage Eyes: PER, EOMI, sclera nonicteric.  Neck: Supple, no masses.  No bruit or JVD.  Pulmonary:  Good air movement, no audible wheezing, no use of accessory muscles.  Cardiac: RRR, normal S1, S2, no Murmurs. Vascular:  mild  trophic changes, no open wounds Vessel Right Left  Radial Palpable Palpable  PT Not Palpable Not Palpable  DP Not Palpable Not Palpable  Gastrointestinal: soft, non-distended. No guarding/no peritoneal signs.  Musculoskeletal: M/S 5/5 throughout.  No visible deformity.  Neurologic: CN 2-12 intact. Pain and light touch intact in extremities.  Symmetrical.  Speech is fluent. Motor exam as listed above. Psychiatric: Judgment intact, Mood & affect appropriate for pt's clinical situation. Dermatologic: No rashes or ulcers noted.  No changes consistent with cellulitis.   CBC Lab Results  Component Value Date   WBC 13.5 (H) 01/07/2020   HGB 12.9 01/07/2020   HCT 38.4 01/07/2020   MCV 92 01/07/2020   PLT 251 01/07/2020    BMET    Component Value Date/Time   NA 135 01/07/2020 1132   NA 128 (L) 05/27/2014 1253   K 4.4 01/07/2020 1132   K 4.2 05/27/2014 1253   CL 100 01/07/2020 1132   CL 97 (L) 05/27/2014 1253   CO2 20 01/07/2020 1132   CO2 25 05/27/2014 1253   GLUCOSE 78 01/07/2020 1132   GLUCOSE 111 (H) 02/28/2017 1105   GLUCOSE 84 05/27/2014 1253   BUN 9 01/07/2020 1132   BUN 9 05/27/2014 1253   CREATININE 0.93 01/07/2020 1132   CREATININE 0.82 02/28/2017 1105   CALCIUM 9.4 01/07/2020 1132   CALCIUM 9.1 05/27/2014 1253   GFRNONAA 61 01/07/2020 1132   GFRNONAA 72 02/28/2017 1105   GFRAA 71 01/07/2020 1132   GFRAA 84 02/28/2017 1105   CrCl cannot be calculated (Patient's most recent lab result is older than the maximum 21 days allowed.).  COAG Lab Results  Component Value Date   INR 0.9 03/18/2014    Radiology CT CHEST LUNG CA SCREEN LOW DOSE W/O CM  Result Date: 11/24/2022 CLINICAL DATA:  60  pack-year smoking history/current smoker EXAM: CT CHEST WITHOUT CONTRAST LOW-DOSE FOR LUNG CANCER SCREENING TECHNIQUE: Multidetector CT imaging of the chest was performed following the standard protocol without IV contrast. RADIATION DOSE REDUCTION: This exam was performed  according to the departmental dose-optimization program which includes automated exposure control, adjustment of the mA and/or kV according to patient size and/or use of iterative reconstruction technique. COMPARISON:  02/24/2020 FINDINGS: Cardiovascular: Aortic atherosclerosis. Normal heart size, without pericardial effusion. Three vessel coronary artery calcification. Mediastinum/Nodes: No mediastinal or hilar adenopathy, given limitations of unenhanced CT. Tiny hiatal hernia. Lungs/Pleura: No pleural fluid. Mild centrilobular emphysema. Mild motion degradation. Biapical pleuroparenchymal scarring. Bilateral pulmonary nodules. Primarily similar. Medial right upper lobe pulmonary nodule of volume derived equivalent diameter 5.5 mm including on 73/3 is new. Upper Abdomen: Cholecystectomy. Normal imaged portions of the spleen, pancreas, adrenal glands. Mild renal cortical thinning bilaterally. Advanced aortic and renal vascular calcifications. Musculoskeletal: Presumed sebaceous cyst about the posterior right chest wall of 1.7 cm. Osteopenia. Mild C6 superior endplate compression deformity is similar. Compression deformities at T7, T9, T11, L1, L2 also identified. Many of these are new and progressive. Example moderate T7 and T9, new or increased. Mild L1 and L2 deformities are felt to be new. IMPRESSION: 1. Lung-RADS 3, probably benign findings. Short-term follow-up in 6 months is recommended with repeat low-dose chest CT without contrast (please use the following order, "CT CHEST LCS NODULE FOLLOW-UP W/O CM"). Although the exam is motion degraded, there is a probable new right upper lobe pulmonary nodule of volume derived equivalent diameter 5.5 mm. 2. Aortic atherosclerosis (ICD10-I70.0), coronary artery atherosclerosis and emphysema (ICD10-J43.9). 3. Osteopenia with new and progressive thoracolumbar compression deformities. Electronically Signed   By: Jeronimo Greaves M.D.   On: 11/24/2022 11:59     Assessment/Plan 1. Bilateral carotid artery stenosis Recommend:   Given the patient's asymptomatic carotid stenosis is unchanged compared to two years ago, no further invasive testing or surgery at this time.  She is not interested in in repair.   Continue antiplatelet therapy as prescribed Continue management of CAD, HTN and Hyperlipidemia Healthy heart diet,  encouraged exercise at least 4 times per week Follow up in 12 months with duplex - VAS US CAROTID; Future  2. Atherosclerosis of native arteries of the extremities with ulceration (HCC) Recommend:   The patient has evidence of atherosclerosis of the lower extremities with claudication.  The patient does not voice lifestyle limiting changes at this point in time.     Noninvasive studies do not suggest clinically significant change. She appears to have good perfusion for wound healing.   No invasive studies, angiography or surgery at this time The patient should continue walking and begin a more formal exercise program. The patient should continue antiplatelet therapy and aggressive treatment of the lipid abnormalities   No changes in the patient's medications at this time   The patient should continue wearing graduated compression socks 10-15 mmHg strength to control the mild edema.  - VAS [place] ABI WITH/WO TBI; Future  3. Essential hypertension Continue antihypertensive medications as already ordered, these medications have been reviewed and there are no changes at this time.  4. Centrilobular emphysema (HCC) Continue pulmonary medications and aerosols as already ordered, these medications have been reviewed and there are no changes at this time.   5. Mixed hyperlipidemia Continue statin as ordered and reviewed, no changes at this time    Levora Dredge, MD  12/18/2022 3:20 PM

## 2022-12-20 LAB — VAS US ABI WITH/WO TBI
Left ABI: 0.95
Right ABI: 0.85

## 2023-01-17 DIAGNOSIS — B351 Tinea unguium: Secondary | ICD-10-CM | POA: Diagnosis not present

## 2023-01-17 DIAGNOSIS — I739 Peripheral vascular disease, unspecified: Secondary | ICD-10-CM | POA: Diagnosis not present

## 2023-01-17 DIAGNOSIS — L97321 Non-pressure chronic ulcer of left ankle limited to breakdown of skin: Secondary | ICD-10-CM | POA: Diagnosis not present

## 2023-01-24 ENCOUNTER — Other Ambulatory Visit: Payer: Self-pay | Admitting: Internal Medicine

## 2023-01-24 DIAGNOSIS — Z1231 Encounter for screening mammogram for malignant neoplasm of breast: Secondary | ICD-10-CM

## 2023-03-22 ENCOUNTER — Ambulatory Visit
Admission: RE | Admit: 2023-03-22 | Discharge: 2023-03-22 | Disposition: A | Discharge status: Home / Self Care | Payer: Medicare HMO | Source: Ambulatory Visit | Attending: Internal Medicine | Admitting: Internal Medicine

## 2023-03-22 DIAGNOSIS — Z1231 Encounter for screening mammogram for malignant neoplasm of breast: Secondary | ICD-10-CM | POA: Insufficient documentation

## 2023-05-02 DIAGNOSIS — J439 Emphysema, unspecified: Secondary | ICD-10-CM | POA: Diagnosis not present

## 2023-05-02 DIAGNOSIS — B351 Tinea unguium: Secondary | ICD-10-CM | POA: Diagnosis not present

## 2023-05-02 DIAGNOSIS — M810 Age-related osteoporosis without current pathological fracture: Secondary | ICD-10-CM | POA: Diagnosis not present

## 2023-05-02 DIAGNOSIS — F17210 Nicotine dependence, cigarettes, uncomplicated: Secondary | ICD-10-CM | POA: Diagnosis not present

## 2023-05-02 DIAGNOSIS — L97321 Non-pressure chronic ulcer of left ankle limited to breakdown of skin: Secondary | ICD-10-CM | POA: Diagnosis not present

## 2023-05-02 DIAGNOSIS — I6522 Occlusion and stenosis of left carotid artery: Secondary | ICD-10-CM | POA: Diagnosis not present

## 2023-05-02 DIAGNOSIS — I10 Essential (primary) hypertension: Secondary | ICD-10-CM | POA: Diagnosis not present

## 2023-05-02 DIAGNOSIS — I739 Peripheral vascular disease, unspecified: Secondary | ICD-10-CM | POA: Diagnosis not present

## 2023-05-14 DIAGNOSIS — M810 Age-related osteoporosis without current pathological fracture: Secondary | ICD-10-CM | POA: Diagnosis not present

## 2023-05-28 ENCOUNTER — Ambulatory Visit
Admission: RE | Admit: 2023-05-28 | Discharge: 2023-05-28 | Disposition: A | Discharge status: Home / Self Care | Payer: Medicare HMO | Source: Ambulatory Visit | Attending: Acute Care | Admitting: Acute Care

## 2023-05-28 DIAGNOSIS — J439 Emphysema, unspecified: Secondary | ICD-10-CM | POA: Diagnosis not present

## 2023-05-28 DIAGNOSIS — I700 Atherosclerosis of aorta: Secondary | ICD-10-CM | POA: Diagnosis not present

## 2023-05-28 DIAGNOSIS — Z87891 Personal history of nicotine dependence: Secondary | ICD-10-CM | POA: Diagnosis not present

## 2023-05-28 DIAGNOSIS — Z122 Encounter for screening for malignant neoplasm of respiratory organs: Secondary | ICD-10-CM | POA: Diagnosis present

## 2023-05-28 DIAGNOSIS — F17210 Nicotine dependence, cigarettes, uncomplicated: Secondary | ICD-10-CM | POA: Insufficient documentation

## 2023-06-19 ENCOUNTER — Other Ambulatory Visit (INDEPENDENT_AMBULATORY_CARE_PROVIDER_SITE_OTHER): Payer: Self-pay | Admitting: Vascular Surgery

## 2023-06-19 DIAGNOSIS — I6523 Occlusion and stenosis of bilateral carotid arteries: Secondary | ICD-10-CM

## 2023-06-19 DIAGNOSIS — I70213 Atherosclerosis of native arteries of extremities with intermittent claudication, bilateral legs: Secondary | ICD-10-CM

## 2023-06-24 NOTE — Progress Notes (Unsigned)
MRN : [id number]  Tracey Rogers is a 78 y.o. ([birth year]) female who presents with chief complaint of check circulation.  History of Present Illness:   The patient returns to the office for followup and review of the noninvasive studies. There have been no interval changes in lower extremity symptoms. No interval shortening of the patient's claudication distance or development of rest pain symptoms. No new ulcers or wounds have occurred since the last visit.   The patient is also followed for carotid stenosis. The carotid stenosis followed by ultrasound.    The patient denies amaurosis fugax. There is no recent history of TIA symptoms or focal motor deficits. There is no prior documented CVA.   The patient is taking enteric-coated aspirin 81 mg daily.   There have been no significant changes to the patient's overall health care.   The patient denies history of DVT, PE or superficial thrombophlebitis. The patient denies recent episodes of angina or shortness of breath.   Most recent ABI Rt=0.89 and Lt=0.98  (previous ABI's Rt=0.85 and Lt=0.95 ). Previous duplex ultrasound  today of the left leg shows the SFA stent is patent with a moderate stenosis with triphasic signals at the ankle no change compared to previous study   Carotid Duplex RICA subtotal occlusion no change compared to 04/29/2018 and LICA 40-59% no changed compared to the last study  No outpatient medications have been marked as taking for the 06/25/23 encounter (Appointment) with Gilda Crease, Latina Craver, MD.    Past Medical History:  Diagnosis Date   Arthritis    Complication of anesthesia    gets weak after surgery   Constipation    COPD (chronic obstructive pulmonary disease) (HCC)    mild   Family history of adverse reaction to anesthesia    son gets postop nausea and vomiting, weak   GERD (gastroesophageal reflux disease)    Heart murmur     Hypertension    Peripheral vascular disease (HCC)    Psoriasis    Psoriasis    Skin ulcer of right great toe (HCC)    Ulcer of left ankle (HCC)    Wears dentures     Past Surgical History:  Procedure Laterality Date   APPLICATION OF A-CELL OF EXTREMITY Left 04/13/2014   Procedure: PLACEMENT OF APPLICATION OF A-CELL ;  Surgeon: Wayland Denis, DO;  Location: [facility];  Service: Plastics;  Laterality: Left;   APPLICATION OF A-CELL OF EXTREMITY Left 09/10/2014   Procedure: APPLICATION OF A-CELL OF EXTREMITY;  Surgeon: Wayland Denis, DO;  Location: [facility];  Service: Plastics;  Laterality: Left;   CARDIOVASCULAR STRESS TEST  03-03-2014  dr Darrold Junker   normal lexi scan sestamibi study/  normal LVF without evidence for significant scar or ischemia   CATARACT EXTRACTION W/ INTRAOCULAR LENS  IMPLANT, BILATERAL  2015   CHOLECYSTECTOMY N/A 06/16/2015   Procedure: LAPAROSCOPIC CHOLECYSTECTOMY WITH CHOLANGIOGRAM;  Surgeon: Earline Mayotte, MD;  Location: [facility];  Service: General;  Laterality: N/A;   DILATION AND CURETTAGE OF UTERUS     ENDOVASCULAR STENT INSERTION  sept  &  oct  2015   LEFT LEG STENTING--  common and superficial femoral artery   ENDOVASCULAR STENT INSERTION  May 27, 2014   LifeStent bare metal stent left SFA   HERNIA REPAIR  10/27/2015   Ventral hernia repaired with Ventrio ST mesh above previous umbilical port site   I & D EXTREMITY Left 09/10/2014   Procedure: IRRIGATION AND DEBRIDEMENT LEFT ANKLE WOUND SURGICAL PREP ;  Surgeon: Wayland Denis, DO;  Location: [facility];  Service: Plastics;  Laterality: Left;   INCISION AND DRAINAGE OF WOUND Bilateral 04/13/2014   Procedure: IRRIGATION AND DEBRIDEMENT OF LEFT ANKLE WOUND AND RIGHT FOOT;  Surgeon: Wayland Denis, DO;  Location: [facility];  Service: Plastics;  Laterality: Bilateral;   LOWER EXTREMITY ANGIOGRAPHY Left 06/26/2017   Procedure: LOWER EXTREMITY  ANGIOGRAPHY;  Surgeon: Renford Dills, MD;  Location: [facility];  Service: Cardiovascular;  Laterality: Left;   TRANSTHORACIC ECHOCARDIOGRAM  03-03-2014   normal LVF/  ef 55-60%/  mild TI and MI   TUBAL LIGATION     VENTRAL HERNIA REPAIR N/A 10/27/2015   Procedure: HERNIA REPAIR VENTRAL ADULT;  Surgeon: Earline Mayotte, MD;  Location: [facility];  Service: General;  Laterality: N/A;    Social History Social History   Tobacco Use   Smoking status: Every Day    Current packs/day: 0.25    Average packs/day: 0.3 packs/day for 58.0 years (14.5 ttl pk-yrs)    Types: Cigarettes   Smokeless tobacco: Never   Tobacco comments:    no smokers in her home  Vaping Use   Vaping status: Never Used  Substance Use Topics   Alcohol use: No    Alcohol/week: 0.0 standard drinks of alcohol   Drug use: No    Family History Family History  Problem Relation Age of Onset   Kidney disease Mother    Hypertension Mother    Heart disease Mother    Breast cancer Maternal Aunt 60   Heart disease Sister    Stroke Sister     Allergies  Allergen Reactions   Clarithromycin Rash    "Mycin"   Methocarbamol Rash   Minocin [Minocycline Hcl] Rash   Other Rash    Optifoam applied to her left achilles wound.    Tetracycline Rash   Tetracyclines & Related Rash     REVIEW OF SYSTEMS (Negative unless checked)  Constitutional: [] Weight loss  [] Fever  [] Chills Cardiac: [] Chest pain   [] Chest pressure   [] Palpitations   [] Shortness of breath when laying flat   [] Shortness of breath with exertion. Vascular:  [x] Pain in legs with walking   [] Pain in legs at rest  [] History of DVT   [] Phlebitis   [] Swelling in legs   [] Varicose veins   [] Non-healing ulcers Pulmonary:   [] Uses home oxygen   [] Productive cough   [] Hemoptysis   [] Wheeze  [] COPD   [] Asthma Neurologic:  [] Dizziness   [] Seizures   [] History of stroke   [] History of TIA  [] Aphasia   [] Vissual changes   [] Weakness or numbness in arm    [] Weakness or numbness in leg Musculoskeletal:   [] Joint swelling   [] Joint pain   [] Low back pain Hematologic:  [] Easy bruising  [] Easy bleeding   [] Hypercoagulable state   [] Anemic Gastrointestinal:  [] Diarrhea   [] Vomiting  [] Gastroesophageal reflux/heartburn   [] Difficulty swallowing. Genitourinary:  [] Chronic kidney disease   [] Difficult urination  [] Frequent urination   [] Blood in urine Skin:  [] Rashes   [] Ulcers  Psychological:  [] History of anxiety   []  History of major depression.  Physical Examination  There were no vitals filed for this visit. There is no height or weight on file to calculate BMI. Gen: WD/WN, NAD Head: [place]/AT, No temporalis wasting.  Ear/Nose/Throat: Hearing grossly intact, nares w/o erythema or drainage Eyes: PER, EOMI, sclera nonicteric.  Neck: Supple, no masses.  No bruit or JVD.  Pulmonary:  Good air movement, no audible wheezing, no use of accessory muscles.  Cardiac: RRR, normal S1, S2, no Murmurs. Vascular:  mild trophic changes, no open wounds Vessel Right Left  Radial Palpable Palpable  PT Not Palpable Not Palpable  DP Not Palpable Not Palpable  Gastrointestinal: soft, non-distended. No guarding/no peritoneal signs.  Musculoskeletal: M/S 5/5 throughout.  No visible deformity.  Neurologic: CN 2-12 intact. Pain and light touch intact in extremities.  Symmetrical.  Speech is fluent. Motor exam as listed above. Psychiatric: Judgment intact, Mood & affect appropriate for pt's clinical situation. Dermatologic: No rashes or ulcers noted.  No changes consistent with cellulitis.   CBC Lab Results  Component Value Date   WBC 13.5 (H) 01/07/2020   HGB 12.9 01/07/2020   HCT 38.4 01/07/2020   MCV 92 01/07/2020   PLT 251 01/07/2020    BMET    Component Value Date/Time   NA 135 01/07/2020 1132   NA 128 (L) 05/27/2014 1253   K 4.4 01/07/2020 1132   K 4.2 05/27/2014 1253   CL 100 01/07/2020 1132   CL 97 (L) 05/27/2014 1253   CO2 20 01/07/2020 1132    CO2 25 05/27/2014 1253   GLUCOSE 78 01/07/2020 1132   GLUCOSE 111 (H) 02/28/2017 1105   GLUCOSE 84 05/27/2014 1253   BUN 9 01/07/2020 1132   BUN 9 05/27/2014 1253   CREATININE 0.93 01/07/2020 1132   CREATININE 0.82 02/28/2017 1105   CALCIUM 9.4 01/07/2020 1132   CALCIUM 9.1 05/27/2014 1253   GFRNONAA 61 01/07/2020 1132   GFRNONAA 72 02/28/2017 1105   GFRAA 71 01/07/2020 1132   GFRAA 84 02/28/2017 1105   CrCl cannot be calculated (Patient's most recent lab result is older than the maximum 21 days allowed.).  COAG Lab Results  Component Value Date   INR 0.9 03/18/2014    Radiology CT CHEST LCS NODULE F/U LOW DOSE WO CONTRAST Result Date: 06/12/2023 CLINICAL DATA:  78 year old female current smoker with 60 pack-year history of smoking. Lung cancer screening examination. EXAM: CT CHEST WITHOUT CONTRAST FOR LUNG CANCER SCREENING NODULE FOLLOW-UP TECHNIQUE: Multidetector CT imaging of the chest was performed following the standard protocol without IV contrast. RADIATION DOSE REDUCTION: This exam was performed according to the departmental dose-optimization program which includes automated exposure control, adjustment of the mA and/or kV according to patient size and/or use of iterative reconstruction technique. COMPARISON:  Low-dose lung cancer screening chest CT 11/21/2022. FINDINGS: Cardiovascular: Heart size is normal. There is no significant pericardial fluid, thickening or pericardial calcification. There is aortic atherosclerosis, as well as atherosclerosis of the great vessels of the mediastinum and the coronary arteries, including calcified atherosclerotic plaque in the left main, left anterior descending, left circumflex and right coronary arteries. Mediastinum/Nodes: No pathologically enlarged mediastinal or hilar lymph nodes. Please note that accurate exclusion of hilar adenopathy is limited on noncontrast CT scans. Esophagus is unremarkable in appearance. No axillary  lymphadenopathy. Lungs/Pleura: Multiple small pulmonary nodules are again noted throughout the lungs bilaterally, similar to the prior study, largest of which is in the medial aspect of  the right upper lobe (axial image 80), with a volume derived mean diameter of 6.1 mm. No larger more suspicious appearing pulmonary nodules or masses are noted. No acute consolidative airspace disease. No pleural effusions. Mild diffuse bronchial wall thickening with mild centrilobular and paraseptal emphysema. Bilateral apical nodular pleuroparenchymal thickening and architectural distortion, most compatible with areas of chronic post infectious or inflammatory scarring. Upper Abdomen: Aortic atherosclerosis. Musculoskeletal: Chronic appearing compression fractures of T6, T7, T9, T11, L1 and L2 are noted, most severe at T9 where there is up to 70% loss of central vertebral body height. There are no aggressive appearing lytic or blastic lesions noted in the visualized portions of the skeleton. IMPRESSION: 1. Lung-RADS 2S, benign appearance or behavior. Continue annual screening with low-dose chest CT without contrast in 12 months. 2. The "S" modifier above refers to potentially clinically significant non lung cancer related findings. Specifically, there is aortic atherosclerosis, in addition to left main and three-vessel coronary artery disease. Please note that although the presence of coronary artery calcium documents the presence of coronary artery disease, the severity of this disease and any potential stenosis cannot be assessed on this non-gated CT examination. Assessment for potential risk factor modification, dietary therapy or pharmacologic therapy may be warranted, if clinically indicated. 3. Mild diffuse bronchial wall thickening with mild centrilobular and paraseptal emphysema; imaging findings suggestive of underlying COPD. Aortic Atherosclerosis (ICD10-I70.0) and Emphysema (ICD10-J43.9). Electronically Signed   By:  Trudie Reed M.D.   On: 06/12/2023 08:01     Assessment/Plan 1. Atherosclerosis of native artery of both lower extremities with intermittent claudication (HCC) (Primary)  Recommend:  The patient has evidence of atherosclerosis of the lower extremities with claudication.  The patient does not voice lifestyle limiting changes at this point in time.  Noninvasive studies do not suggest clinically significant change.  No invasive studies, angiography or surgery at this time The patient should continue walking and begin a more formal exercise program.  The patient should continue antiplatelet therapy and aggressive treatment of the lipid abnormalities  No changes in the patient's medications at this time  Continued surveillance is indicated as atherosclerosis is likely to progress with time.    The patient will continue follow up with noninvasive studies as ordered.  - VAS [place] ABI WITH/WO TBI; Future  2. Bilateral carotid artery stenosis Recommend:   Given the patient's asymptomatic carotid stenosis is unchanged compared to two years ago, no further invasive testing or surgery at this time.  She is not interested in in repair.   Continue antiplatelet therapy as prescribed Continue management of CAD, HTN and Hyperlipidemia Healthy heart diet,  encouraged exercise at least 4 times per week Follow up in 6 months with duplex - VAS US CAROTID; Future  3. Subclavian artery stenosis, right (HCC) Recommend:   Given the patient's asymptomatic carotid stenosis is unchanged compared to two years ago, no further invasive testing or surgery at this time.  She is not interested in in repair.   Continue antiplatelet therapy as prescribed Continue management of CAD, HTN and Hyperlipidemia Healthy heart diet,  encouraged exercise at least 4 times per week Follow up in 6 months with duplex  4. Essential hypertension Continue antihypertensive medications as already ordered, these medications  have been reviewed and there are no changes at this time.  5. Centrilobular emphysema (HCC) Continue pulmonary medications and aerosols as already ordered, these medications have been reviewed and there are no changes at this time.   6. Mixed hyperlipidemia  Continue statin as ordered and reviewed, no changes at this time    Levora Dredge, MD  06/24/2023 2:29 PM

## 2023-06-25 ENCOUNTER — Ambulatory Visit (INDEPENDENT_AMBULATORY_CARE_PROVIDER_SITE_OTHER): Payer: Medicare HMO

## 2023-06-25 ENCOUNTER — Encounter (INDEPENDENT_AMBULATORY_CARE_PROVIDER_SITE_OTHER): Payer: Self-pay | Admitting: Vascular Surgery

## 2023-06-25 ENCOUNTER — Ambulatory Visit (INDEPENDENT_AMBULATORY_CARE_PROVIDER_SITE_OTHER): Payer: Medicare HMO | Admitting: Vascular Surgery

## 2023-06-25 VITALS — BP 154/61 | HR 62 | Resp 18 | Ht 67.0 in | Wt 149.0 lb

## 2023-06-25 DIAGNOSIS — I70213 Atherosclerosis of native arteries of extremities with intermittent claudication, bilateral legs: Secondary | ICD-10-CM | POA: Diagnosis not present

## 2023-06-25 DIAGNOSIS — I6523 Occlusion and stenosis of bilateral carotid arteries: Secondary | ICD-10-CM | POA: Diagnosis not present

## 2023-06-25 DIAGNOSIS — I771 Stricture of artery: Secondary | ICD-10-CM | POA: Diagnosis not present

## 2023-06-25 DIAGNOSIS — J432 Centrilobular emphysema: Secondary | ICD-10-CM | POA: Diagnosis not present

## 2023-06-25 DIAGNOSIS — I10 Essential (primary) hypertension: Secondary | ICD-10-CM

## 2023-06-25 DIAGNOSIS — E782 Mixed hyperlipidemia: Secondary | ICD-10-CM | POA: Diagnosis not present

## 2023-06-25 LAB — VAS US ABI WITH/WO TBI
Left ABI: 0.98
Right ABI: 0.89

## 2023-06-26 ENCOUNTER — Encounter (INDEPENDENT_AMBULATORY_CARE_PROVIDER_SITE_OTHER): Payer: Self-pay | Admitting: Vascular Surgery

## 2023-06-27 DIAGNOSIS — J439 Emphysema, unspecified: Secondary | ICD-10-CM | POA: Diagnosis not present

## 2023-06-27 DIAGNOSIS — I739 Peripheral vascular disease, unspecified: Secondary | ICD-10-CM | POA: Diagnosis not present

## 2023-06-27 DIAGNOSIS — M810 Age-related osteoporosis without current pathological fracture: Secondary | ICD-10-CM | POA: Diagnosis not present

## 2023-06-27 DIAGNOSIS — I6523 Occlusion and stenosis of bilateral carotid arteries: Secondary | ICD-10-CM | POA: Diagnosis not present

## 2023-07-12 DIAGNOSIS — M810 Age-related osteoporosis without current pathological fracture: Secondary | ICD-10-CM | POA: Diagnosis not present

## 2023-07-12 DIAGNOSIS — J439 Emphysema, unspecified: Secondary | ICD-10-CM | POA: Diagnosis not present

## 2023-07-12 DIAGNOSIS — J441 Chronic obstructive pulmonary disease with (acute) exacerbation: Secondary | ICD-10-CM | POA: Diagnosis not present

## 2023-08-06 DIAGNOSIS — H43813 Vitreous degeneration, bilateral: Secondary | ICD-10-CM | POA: Diagnosis not present

## 2023-08-06 DIAGNOSIS — Z961 Presence of intraocular lens: Secondary | ICD-10-CM | POA: Diagnosis not present

## 2023-08-06 DIAGNOSIS — H26492 Other secondary cataract, left eye: Secondary | ICD-10-CM | POA: Diagnosis not present

## 2023-08-06 DIAGNOSIS — Z0100 Encounter for examination of eyes and vision without abnormal findings: Secondary | ICD-10-CM | POA: Diagnosis not present

## 2023-08-15 DIAGNOSIS — E559 Vitamin D deficiency, unspecified: Secondary | ICD-10-CM | POA: Diagnosis not present

## 2023-08-15 DIAGNOSIS — M810 Age-related osteoporosis without current pathological fracture: Secondary | ICD-10-CM | POA: Diagnosis not present

## 2023-08-17 DIAGNOSIS — M79674 Pain in right toe(s): Secondary | ICD-10-CM | POA: Diagnosis not present

## 2023-08-17 DIAGNOSIS — M79675 Pain in left toe(s): Secondary | ICD-10-CM | POA: Diagnosis not present

## 2023-08-17 DIAGNOSIS — B351 Tinea unguium: Secondary | ICD-10-CM | POA: Diagnosis not present

## 2023-08-17 DIAGNOSIS — M810 Age-related osteoporosis without current pathological fracture: Secondary | ICD-10-CM | POA: Diagnosis not present

## 2023-08-17 DIAGNOSIS — I739 Peripheral vascular disease, unspecified: Secondary | ICD-10-CM | POA: Diagnosis not present

## 2023-08-17 DIAGNOSIS — L97321 Non-pressure chronic ulcer of left ankle limited to breakdown of skin: Secondary | ICD-10-CM | POA: Diagnosis not present

## 2023-09-19 DIAGNOSIS — M810 Age-related osteoporosis without current pathological fracture: Secondary | ICD-10-CM | POA: Diagnosis not present

## 2023-10-16 ENCOUNTER — Encounter (INDEPENDENT_AMBULATORY_CARE_PROVIDER_SITE_OTHER): Payer: Self-pay

## 2023-10-24 DIAGNOSIS — R739 Hyperglycemia, unspecified: Secondary | ICD-10-CM | POA: Diagnosis not present

## 2023-10-24 DIAGNOSIS — M810 Age-related osteoporosis without current pathological fracture: Secondary | ICD-10-CM | POA: Diagnosis not present

## 2023-10-24 DIAGNOSIS — I10 Essential (primary) hypertension: Secondary | ICD-10-CM | POA: Diagnosis not present

## 2023-10-31 DIAGNOSIS — Z1331 Encounter for screening for depression: Secondary | ICD-10-CM | POA: Diagnosis not present

## 2023-10-31 DIAGNOSIS — M810 Age-related osteoporosis without current pathological fracture: Secondary | ICD-10-CM | POA: Diagnosis not present

## 2023-10-31 DIAGNOSIS — I10 Essential (primary) hypertension: Secondary | ICD-10-CM | POA: Diagnosis not present

## 2023-10-31 DIAGNOSIS — F17210 Nicotine dependence, cigarettes, uncomplicated: Secondary | ICD-10-CM | POA: Diagnosis not present

## 2023-10-31 DIAGNOSIS — Z9181 History of falling: Secondary | ICD-10-CM | POA: Diagnosis not present

## 2023-10-31 DIAGNOSIS — I6523 Occlusion and stenosis of bilateral carotid arteries: Secondary | ICD-10-CM | POA: Diagnosis not present

## 2023-10-31 DIAGNOSIS — Z0000 Encounter for general adult medical examination without abnormal findings: Secondary | ICD-10-CM | POA: Diagnosis not present

## 2023-10-31 DIAGNOSIS — J439 Emphysema, unspecified: Secondary | ICD-10-CM | POA: Diagnosis not present

## 2023-12-05 DIAGNOSIS — B351 Tinea unguium: Secondary | ICD-10-CM | POA: Diagnosis not present

## 2023-12-05 DIAGNOSIS — M79675 Pain in left toe(s): Secondary | ICD-10-CM | POA: Diagnosis not present

## 2023-12-05 DIAGNOSIS — M79674 Pain in right toe(s): Secondary | ICD-10-CM | POA: Diagnosis not present

## 2023-12-05 DIAGNOSIS — L97321 Non-pressure chronic ulcer of left ankle limited to breakdown of skin: Secondary | ICD-10-CM | POA: Diagnosis not present

## 2023-12-05 DIAGNOSIS — I739 Peripheral vascular disease, unspecified: Secondary | ICD-10-CM | POA: Diagnosis not present

## 2023-12-21 DIAGNOSIS — I700 Atherosclerosis of aorta: Secondary | ICD-10-CM | POA: Diagnosis not present

## 2023-12-21 DIAGNOSIS — R109 Unspecified abdominal pain: Secondary | ICD-10-CM | POA: Diagnosis not present

## 2023-12-21 DIAGNOSIS — R110 Nausea: Secondary | ICD-10-CM | POA: Diagnosis not present

## 2023-12-21 DIAGNOSIS — I708 Atherosclerosis of other arteries: Secondary | ICD-10-CM | POA: Diagnosis not present

## 2023-12-21 DIAGNOSIS — R000 Tachycardia, unspecified: Secondary | ICD-10-CM | POA: Diagnosis not present

## 2023-12-21 DIAGNOSIS — I739 Peripheral vascular disease, unspecified: Secondary | ICD-10-CM | POA: Diagnosis not present

## 2023-12-21 DIAGNOSIS — J449 Chronic obstructive pulmonary disease, unspecified: Secondary | ICD-10-CM | POA: Diagnosis not present

## 2023-12-21 DIAGNOSIS — E278 Other specified disorders of adrenal gland: Secondary | ICD-10-CM | POA: Diagnosis not present

## 2023-12-21 DIAGNOSIS — N83201 Unspecified ovarian cyst, right side: Secondary | ICD-10-CM | POA: Diagnosis not present

## 2023-12-21 DIAGNOSIS — I10 Essential (primary) hypertension: Secondary | ICD-10-CM | POA: Diagnosis not present

## 2023-12-21 DIAGNOSIS — Z7982 Long term (current) use of aspirin: Secondary | ICD-10-CM | POA: Diagnosis not present

## 2023-12-21 DIAGNOSIS — R079 Chest pain, unspecified: Secondary | ICD-10-CM | POA: Diagnosis not present

## 2023-12-21 DIAGNOSIS — N838 Other noninflammatory disorders of ovary, fallopian tube and broad ligament: Secondary | ICD-10-CM | POA: Diagnosis not present

## 2023-12-21 DIAGNOSIS — I771 Stricture of artery: Secondary | ICD-10-CM | POA: Diagnosis not present

## 2023-12-22 DIAGNOSIS — N949 Unspecified condition associated with female genital organs and menstrual cycle: Secondary | ICD-10-CM | POA: Diagnosis not present

## 2023-12-23 NOTE — Progress Notes (Signed)
 MRN : [id number]  Tracey Rogers is a 78 y.o. (1945-11-07) female who presents with chief complaint of check circulation.  History of Present Illness:   The patient returns to the office for followup and review of the noninvasive studies. There have been no interval changes in lower extremity symptoms. No interval shortening of the patient's claudication distance or development of rest pain symptoms. No new ulcers or wounds have occurred since the last visit.   The patient is also followed for carotid stenosis. The carotid stenosis followed by ultrasound.    The patient denies amaurosis fugax. There is no recent history of TIA symptoms or focal motor deficits. There is no prior documented CVA.   The patient is taking enteric-coated aspirin  81 mg daily.   There have been no significant changes to the patient's overall health care.   The patient denies history of DVT, PE or superficial thrombophlebitis. The patient denies recent episodes of angina or shortness of breath.   Most recent ABI Rt=0.95 and Lt=1.08 (previous ABI's ABI Rt=0.89 and Lt=0.98). Previous duplex ultrasound  today of the left leg shows the SFA stent is patent with a moderate stenosis with triphasic signals at the ankle no change compared to previous study   Carotid Duplex RICA subtotal occlusion no change compared to 04/29/2018 and LICA 60-79% no changed compared to the last study    No outpatient medications have been marked as taking for the 12/24/23 encounter (Appointment) with Jama, Cordella MATSU, MD.    Past Medical History:  Diagnosis Date   Arthritis    Complication of anesthesia    gets weak after surgery   Constipation    COPD (chronic obstructive pulmonary disease) (HCC)    mild   Family history of adverse reaction to anesthesia    son gets postop nausea and vomiting, weak   GERD (gastroesophageal reflux disease)    Heart  murmur    Hypertension    Peripheral vascular disease (HCC)    Psoriasis    Psoriasis    Skin ulcer of right great toe (HCC)    Ulcer of left ankle (HCC)    Wears dentures     Past Surgical History:  Procedure Laterality Date   APPLICATION OF A-CELL OF EXTREMITY Left 04/13/2014   Procedure: PLACEMENT OF APPLICATION OF A-CELL ;  Surgeon: Estefana Reichert, DO;  Location: [facility];  Service: Plastics;  Laterality: Left;   APPLICATION OF A-CELL OF EXTREMITY Left 09/10/2014   Procedure: APPLICATION OF A-CELL OF EXTREMITY;  Surgeon: Estefana Reichert, DO;  Location: [facility];  Service: Plastics;  Laterality: Left;   CARDIOVASCULAR STRESS TEST  03-03-2014  dr ammon   normal lexi scan sestamibi study/  normal LVF without evidence for significant scar or ischemia   CATARACT EXTRACTION W/ INTRAOCULAR LENS  IMPLANT, BILATERAL  2015   CHOLECYSTECTOMY N/A 06/16/2015   Procedure: LAPAROSCOPIC CHOLECYSTECTOMY WITH CHOLANGIOGRAM;  Surgeon: Reyes LELON Cota, MD;  Location: [facility];  Service: General;  Laterality: N/A;   DILATION AND CURETTAGE OF UTERUS     ENDOVASCULAR STENT INSERTION  sept  &  oct  2015   LEFT LEG STENTING--  common and superficial femoral artery   ENDOVASCULAR STENT INSERTION  May 27, 2014   LifeStent bare metal stent left SFA   HERNIA REPAIR  10/27/2015   Ventral hernia repaired with Ventrio ST mesh above previous umbilical port site   I & D EXTREMITY Left 09/10/2014   Procedure: IRRIGATION AND DEBRIDEMENT LEFT ANKLE WOUND SURGICAL PREP ;  Surgeon: Estefana Reichert, DO;  Location: [facility];  Service: Plastics;  Laterality: Left;   INCISION AND DRAINAGE OF WOUND Bilateral 04/13/2014   Procedure: IRRIGATION AND DEBRIDEMENT OF LEFT ANKLE WOUND AND RIGHT FOOT;  Surgeon: Estefana Reichert, DO;  Location: [facility];  Service: Plastics;  Laterality: Bilateral;   LOWER EXTREMITY ANGIOGRAPHY Left 06/26/2017   Procedure: LOWER  EXTREMITY ANGIOGRAPHY;  Surgeon: Jama Cordella MATSU, MD;  Location: [facility];  Service: Cardiovascular;  Laterality: Left;   TRANSTHORACIC ECHOCARDIOGRAM  03-03-2014   normal LVF/  ef 55-60%/  mild TI and MI   TUBAL LIGATION     VENTRAL HERNIA REPAIR N/A 10/27/2015   Procedure: HERNIA REPAIR VENTRAL ADULT;  Surgeon: Reyes LELON Cota, MD;  Location: [facility];  Service: General;  Laterality: N/A;    Social History Social History   Tobacco Use   Smoking status: Every Day    Current packs/day: 0.25    Average packs/day: 0.3 packs/day for 58.0 years (14.5 ttl pk-yrs)    Types: Cigarettes   Smokeless tobacco: Never   Tobacco comments:    no smokers in her home  Vaping Use   Vaping status: Never Used  Substance Use Topics   Alcohol use: No    Alcohol/week: 0.0 standard drinks of alcohol   Drug use: No    Family History Family History  Problem Relation Age of Onset   Kidney disease Mother    Hypertension Mother    Heart disease Mother    Breast cancer Maternal Aunt 60   Heart disease Sister    Stroke Sister     Allergies  Allergen Reactions   Clarithromycin Rash    Mycin   Methocarbamol Rash   Minocin [Minocycline Hcl] Rash   Other Rash    Optifoam applied to her left achilles wound.    Tetracycline Rash   Tetracyclines & Related Rash     REVIEW OF SYSTEMS (Negative unless checked)  Constitutional: [] Weight loss  [] Fever  [] Chills Cardiac: [] Chest pain   [] Chest pressure   [] Palpitations   [] Shortness of breath when laying flat   [] Shortness of breath with exertion. Vascular:  [x] Pain in legs with walking   [] Pain in legs at rest  [] History of DVT   [] Phlebitis   [] Swelling in legs   [] Varicose veins   [] Non-healing ulcers Pulmonary:   [] Uses home oxygen   [] Productive cough   [] Hemoptysis   [] Wheeze  [x] COPD   [] Asthma Neurologic:  [] Dizziness   [] Seizures   [] History of stroke   [] History of TIA  [] Aphasia   [] Vissual changes   [] Weakness or numbness  in arm   [] Weakness or numbness in leg Musculoskeletal:   [] Joint swelling   [x] Joint pain   [x] Low back pain Hematologic:  [] Easy bruising  [] Easy bleeding   [] Hypercoagulable state   [] Anemic Gastrointestinal:  [] Diarrhea   [] Vomiting  [] Gastroesophageal reflux/heartburn   [] Difficulty swallowing. Genitourinary:  [] Chronic kidney disease   [] Difficult urination  [] Frequent urination   [] Blood in urine Skin:  []   Rashes   [] Ulcers  Psychological:  [] History of anxiety   []  History of major depression.  Physical Examination  There were no vitals filed for this visit. There is no height or weight on file to calculate BMI. Gen: WD/WN, NAD Head: [place]/AT, No temporalis wasting.  Ear/Nose/Throat: Hearing grossly intact, nares w/o erythema or drainage Eyes: PER, EOMI, sclera nonicteric.  Neck: Supple, no masses.  No bruit or JVD.  Pulmonary:  Good air movement, no audible wheezing, no use of accessory muscles.  Cardiac: RRR, normal S1, S2, no Murmurs. Vascular:  mild trophic changes, no open wounds Vessel Right Left  Radial Palpable Palpable  PT Not Palpable Not Palpable  DP Not Palpable Not Palpable  Gastrointestinal: soft, non-distended. No guarding/no peritoneal signs.  Musculoskeletal: M/S 5/5 throughout.  No visible deformity.  Neurologic: CN 2-12 intact. Pain and light touch intact in extremities.  Symmetrical.  Speech is fluent. Motor exam as listed above. Psychiatric: Judgment intact, Mood & affect appropriate for pt's clinical situation. Dermatologic: No rashes or ulcers noted.  No changes consistent with cellulitis.   CBC Lab Results  Component Value Date   WBC 13.5 (H) 01/07/2020   HGB 12.9 01/07/2020   HCT 38.4 01/07/2020   MCV 92 01/07/2020   PLT 251 01/07/2020    BMET    Component Value Date/Time   NA 135 01/07/2020 1132   NA 128 (L) 05/27/2014 1253   K 4.4 01/07/2020 1132   K 4.2 05/27/2014 1253   CL 100 01/07/2020 1132   CL 97 (L) 05/27/2014 1253   CO2 20  01/07/2020 1132   CO2 25 05/27/2014 1253   GLUCOSE 78 01/07/2020 1132   GLUCOSE 111 (H) 02/28/2017 1105   GLUCOSE 84 05/27/2014 1253   BUN 9 01/07/2020 1132   BUN 9 05/27/2014 1253   CREATININE 0.93 01/07/2020 1132   CREATININE 0.82 02/28/2017 1105   CALCIUM  9.4 01/07/2020 1132   CALCIUM  9.1 05/27/2014 1253   GFRNONAA 61 01/07/2020 1132   GFRNONAA 72 02/28/2017 1105   GFRAA 71 01/07/2020 1132   GFRAA 84 02/28/2017 1105   CrCl cannot be calculated (Patient's most recent lab result is older than the maximum 21 days allowed.).  COAG Lab Results  Component Value Date   INR 0.9 03/18/2014    Radiology No results found.   Assessment/Plan 1. Atherosclerosis of native artery of both lower extremities with intermittent claudication (HCC) (Primary) Recommend:   The patient has evidence of atherosclerosis of the lower extremities with claudication.  The patient does not voice lifestyle limiting changes at this point in time.   Noninvasive studies do not suggest clinically significant change.   No invasive studies, angiography or surgery at this time The patient should continue walking and begin a more formal exercise program.  The patient should continue antiplatelet therapy and aggressive treatment of the lipid abnormalities   No changes in the patient's medications at this time   Continued surveillance is indicated as atherosclerosis is likely to progress with time.     The patient will continue follow up with noninvasive studies as ordered.  - VAS US  ABI WITH/WO TBI; Future  2. Bilateral carotid artery stenosis Recommend:   Given the patient's asymptomatic carotid stenosis is unchanged compared to two years ago, no further invasive testing or surgery at this time.  She is not interested in in repair.   Continue antiplatelet therapy as prescribed Continue management of CAD, HTN and Hyperlipidemia Healthy heart diet,  encouraged exercise at  least 4 times per week Follow  up in 6 months with duplex - VAS US  CAROTID; Future  3. Subclavian artery stenosis, right (HCC) Recommend:   Given the patient's asymptomatic carotid stenosis is unchanged compared to two years ago, no further invasive testing or surgery at this time.  She is not interested in in repair.   Continue antiplatelet therapy as prescribed Continue management of CAD, HTN and Hyperlipidemia Healthy heart diet,  encouraged exercise at least 4 times per week Follow up in 6 months with duplex  4. Chronic venous insufficiency No surgery or intervention at this point in time.   The patient is CEAP [id number]   I have discussed with the patient venous insufficiency and why it  causes symptoms. I have discussed with the patient the chronic skin changes that accompany venous insufficiency and the long term sequela such as infection and ulceration.  Patient will begin wearing graduated compression stockings or compression wraps on a daily basis.  The patient will put the compression on first thing in the morning and removing them in the evening. The patient is instructed specifically not to sleep in the compression.    In addition, behavioral modification including several periods of elevation of the lower extremities during the day will be continued. I have demonstrated that proper elevation is a position with the ankles at heart level.  The patient is instructed to begin routine exercise, especially walking on a daily basis  Patient should undergo duplex ultrasound of the venous system to ensure that DVT or reflux is not present.  Following the review of the ultrasound the patient will follow up in 2-3 months to reassess the degree of swelling and the control that graduated compression stockings or compression wraps  is offering.   At that time the patient can be assessed for a Lymph Pump depending on the effectiveness of conservative therapy and the control of the associated lymphedema.  5. Essential  hypertension Continue antihypertensive medications as already ordered, these medications have been reviewed and there are no changes at this time.  6. Mixed hyperlipidemia Continue statin as ordered and reviewed, no changes at this time    Cordella Shawl, MD  12/23/2023 4:10 PM

## 2023-12-24 ENCOUNTER — Ambulatory Visit (INDEPENDENT_AMBULATORY_CARE_PROVIDER_SITE_OTHER): Payer: Medicare HMO

## 2023-12-24 ENCOUNTER — Ambulatory Visit (INDEPENDENT_AMBULATORY_CARE_PROVIDER_SITE_OTHER): Payer: Medicare HMO | Admitting: Vascular Surgery

## 2023-12-24 ENCOUNTER — Encounter (INDEPENDENT_AMBULATORY_CARE_PROVIDER_SITE_OTHER): Payer: Self-pay | Admitting: Vascular Surgery

## 2023-12-24 VITALS — BP 138/75 | HR 51 | Resp 16 | Ht 65.0 in | Wt 125.2 lb

## 2023-12-24 DIAGNOSIS — E782 Mixed hyperlipidemia: Secondary | ICD-10-CM

## 2023-12-24 DIAGNOSIS — I70213 Atherosclerosis of native arteries of extremities with intermittent claudication, bilateral legs: Secondary | ICD-10-CM | POA: Diagnosis not present

## 2023-12-24 DIAGNOSIS — I872 Venous insufficiency (chronic) (peripheral): Secondary | ICD-10-CM

## 2023-12-24 DIAGNOSIS — I10 Essential (primary) hypertension: Secondary | ICD-10-CM | POA: Diagnosis not present

## 2023-12-24 DIAGNOSIS — I6523 Occlusion and stenosis of bilateral carotid arteries: Secondary | ICD-10-CM

## 2023-12-24 DIAGNOSIS — I771 Stricture of artery: Secondary | ICD-10-CM

## 2023-12-24 LAB — VAS US ABI WITH/WO TBI
Left ABI: 1.08
Right ABI: 0.95

## 2023-12-26 ENCOUNTER — Other Ambulatory Visit: Payer: Self-pay | Admitting: Internal Medicine

## 2023-12-26 DIAGNOSIS — Z1231 Encounter for screening mammogram for malignant neoplasm of breast: Secondary | ICD-10-CM

## 2023-12-29 ENCOUNTER — Encounter (INDEPENDENT_AMBULATORY_CARE_PROVIDER_SITE_OTHER): Payer: Self-pay | Admitting: Vascular Surgery

## 2024-03-10 DIAGNOSIS — B351 Tinea unguium: Secondary | ICD-10-CM | POA: Diagnosis not present

## 2024-03-10 DIAGNOSIS — M79674 Pain in right toe(s): Secondary | ICD-10-CM | POA: Diagnosis not present

## 2024-03-10 DIAGNOSIS — M79675 Pain in left toe(s): Secondary | ICD-10-CM | POA: Diagnosis not present

## 2024-03-10 DIAGNOSIS — L97321 Non-pressure chronic ulcer of left ankle limited to breakdown of skin: Secondary | ICD-10-CM | POA: Diagnosis not present

## 2024-03-10 DIAGNOSIS — I739 Peripheral vascular disease, unspecified: Secondary | ICD-10-CM | POA: Diagnosis not present

## 2024-03-24 ENCOUNTER — Ambulatory Visit
Admission: RE | Admit: 2024-03-24 | Discharge: 2024-03-24 | Disposition: A | Discharge status: Home / Self Care | Source: Ambulatory Visit | Attending: Internal Medicine | Admitting: Internal Medicine

## 2024-03-24 DIAGNOSIS — Z1231 Encounter for screening mammogram for malignant neoplasm of breast: Secondary | ICD-10-CM | POA: Diagnosis not present

## 2024-05-08 ENCOUNTER — Other Ambulatory Visit: Payer: Self-pay | Admitting: Internal Medicine

## 2024-05-08 DIAGNOSIS — I2510 Atherosclerotic heart disease of native coronary artery without angina pectoris: Secondary | ICD-10-CM

## 2024-05-08 DIAGNOSIS — I10 Essential (primary) hypertension: Secondary | ICD-10-CM

## 2024-05-22 LAB — COLOGUARD: COLOGUARD: NEGATIVE

## 2024-05-26 ENCOUNTER — Ambulatory Visit
Admission: RE | Admit: 2024-05-26 | Discharge: 2024-05-26 | Disposition: A | Discharge status: Home / Self Care | Payer: Self-pay | Source: Ambulatory Visit | Attending: Internal Medicine | Admitting: Internal Medicine

## 2024-05-26 DIAGNOSIS — I10 Essential (primary) hypertension: Secondary | ICD-10-CM | POA: Insufficient documentation

## 2024-05-26 DIAGNOSIS — I2510 Atherosclerotic heart disease of native coronary artery without angina pectoris: Secondary | ICD-10-CM | POA: Insufficient documentation

## 2024-06-23 ENCOUNTER — Encounter (INDEPENDENT_AMBULATORY_CARE_PROVIDER_SITE_OTHER)

## 2024-06-23 ENCOUNTER — Ambulatory Visit (INDEPENDENT_AMBULATORY_CARE_PROVIDER_SITE_OTHER): Admitting: Vascular Surgery

## 2024-06-23 ENCOUNTER — Encounter (INDEPENDENT_AMBULATORY_CARE_PROVIDER_SITE_OTHER): Payer: Self-pay
